# Patient Record
Sex: Female | Born: 1940
Health system: Southern US, Community
[De-identification: ages and names within clinical notes are randomized; demographics above are authoritative.]

## PROBLEM LIST (undated history)

## (undated) DIAGNOSIS — I341 Nonrheumatic mitral (valve) prolapse: Secondary | ICD-10-CM

## (undated) DIAGNOSIS — R51 Headache: Secondary | ICD-10-CM

## (undated) DIAGNOSIS — R112 Nausea with vomiting, unspecified: Secondary | ICD-10-CM

## (undated) DIAGNOSIS — J45909 Unspecified asthma, uncomplicated: Secondary | ICD-10-CM

## (undated) DIAGNOSIS — I82409 Acute embolism and thrombosis of unspecified deep veins of unspecified lower extremity: Secondary | ICD-10-CM

## (undated) DIAGNOSIS — K219 Gastro-esophageal reflux disease without esophagitis: Secondary | ICD-10-CM

## (undated) DIAGNOSIS — E785 Hyperlipidemia, unspecified: Secondary | ICD-10-CM

## (undated) DIAGNOSIS — C801 Malignant (primary) neoplasm, unspecified: Secondary | ICD-10-CM

## (undated) DIAGNOSIS — T4145XA Adverse effect of unspecified anesthetic, initial encounter: Secondary | ICD-10-CM

## (undated) DIAGNOSIS — Z302 Encounter for sterilization: Secondary | ICD-10-CM

## (undated) DIAGNOSIS — I10 Essential (primary) hypertension: Secondary | ICD-10-CM

## (undated) DIAGNOSIS — R011 Cardiac murmur, unspecified: Secondary | ICD-10-CM

## (undated) DIAGNOSIS — U071 COVID-19: Secondary | ICD-10-CM

## (undated) DIAGNOSIS — I499 Cardiac arrhythmia, unspecified: Secondary | ICD-10-CM

## (undated) DIAGNOSIS — Z9889 Other specified postprocedural states: Secondary | ICD-10-CM

## (undated) DIAGNOSIS — N390 Urinary tract infection, site not specified: Secondary | ICD-10-CM

## (undated) DIAGNOSIS — R519 Headache, unspecified: Secondary | ICD-10-CM

## (undated) HISTORY — PX: NASAL SINUS SURGERY: SHX719

## (undated) HISTORY — PX: ABDOMINAL HYSTERECTOMY: SHX81

## (undated) HISTORY — DX: Unspecified asthma, uncomplicated: J45.909

## (undated) HISTORY — PX: JOINT REPLACEMENT: SHX530

## (undated) HISTORY — PX: BACK SURGERY: SHX140

## (undated) HISTORY — PX: CATARACT EXTRACTION W/ INTRAOCULAR LENS IMPLANT: SHX1309

---

## 2005-02-17 ENCOUNTER — Ambulatory Visit: Payer: Self-pay | Admitting: Psychiatry

## 2005-03-09 ENCOUNTER — Ambulatory Visit: Payer: Self-pay | Admitting: Internal Medicine

## 2005-06-09 ENCOUNTER — Ambulatory Visit: Payer: Self-pay | Admitting: Urology

## 2005-11-10 ENCOUNTER — Ambulatory Visit: Payer: Self-pay | Admitting: Internal Medicine

## 2006-01-31 ENCOUNTER — Ambulatory Visit: Payer: Self-pay | Admitting: Gastroenterology

## 2006-02-01 ENCOUNTER — Ambulatory Visit: Payer: Self-pay | Admitting: Gastroenterology

## 2007-10-17 ENCOUNTER — Ambulatory Visit: Payer: Self-pay | Admitting: Unknown Physician Specialty

## 2007-12-04 ENCOUNTER — Encounter: Payer: Self-pay | Admitting: Unknown Physician Specialty

## 2007-12-11 ENCOUNTER — Encounter: Payer: Self-pay | Admitting: Unknown Physician Specialty

## 2009-02-10 ENCOUNTER — Encounter: Payer: Self-pay | Admitting: Otolaryngology

## 2009-10-02 ENCOUNTER — Inpatient Hospital Stay: Payer: Self-pay | Admitting: Internal Medicine

## 2009-10-23 ENCOUNTER — Other Ambulatory Visit: Payer: Self-pay | Admitting: Gastroenterology

## 2010-01-25 ENCOUNTER — Ambulatory Visit: Payer: Self-pay | Admitting: General Practice

## 2010-02-08 ENCOUNTER — Inpatient Hospital Stay: Payer: Self-pay | Admitting: General Practice

## 2010-07-26 ENCOUNTER — Ambulatory Visit: Payer: Self-pay | Admitting: Internal Medicine

## 2011-02-16 ENCOUNTER — Ambulatory Visit: Payer: Self-pay | Admitting: Gastroenterology

## 2011-02-20 LAB — PATHOLOGY REPORT

## 2011-07-12 ENCOUNTER — Ambulatory Visit: Payer: Self-pay | Admitting: Internal Medicine

## 2011-08-15 ENCOUNTER — Ambulatory Visit: Payer: Self-pay | Admitting: Internal Medicine

## 2012-02-06 ENCOUNTER — Ambulatory Visit: Payer: Self-pay | Admitting: Family Medicine

## 2012-05-15 DIAGNOSIS — R339 Retention of urine, unspecified: Secondary | ICD-10-CM | POA: Insufficient documentation

## 2012-05-15 DIAGNOSIS — R399 Unspecified symptoms and signs involving the genitourinary system: Secondary | ICD-10-CM | POA: Insufficient documentation

## 2012-05-15 DIAGNOSIS — N3946 Mixed incontinence: Secondary | ICD-10-CM | POA: Insufficient documentation

## 2012-05-15 DIAGNOSIS — R3129 Other microscopic hematuria: Secondary | ICD-10-CM | POA: Insufficient documentation

## 2012-05-15 DIAGNOSIS — N3020 Other chronic cystitis without hematuria: Secondary | ICD-10-CM | POA: Insufficient documentation

## 2012-08-15 ENCOUNTER — Ambulatory Visit: Payer: Self-pay | Admitting: Internal Medicine

## 2012-12-17 DIAGNOSIS — R1031 Right lower quadrant pain: Secondary | ICD-10-CM | POA: Insufficient documentation

## 2012-12-17 DIAGNOSIS — N83201 Unspecified ovarian cyst, right side: Secondary | ICD-10-CM | POA: Insufficient documentation

## 2013-04-09 ENCOUNTER — Ambulatory Visit: Payer: Self-pay | Admitting: Internal Medicine

## 2013-08-15 ENCOUNTER — Ambulatory Visit: Payer: Self-pay | Admitting: Ophthalmology

## 2013-08-26 ENCOUNTER — Ambulatory Visit: Payer: Self-pay | Admitting: Ophthalmology

## 2013-08-30 DIAGNOSIS — I10 Essential (primary) hypertension: Secondary | ICD-10-CM | POA: Insufficient documentation

## 2013-12-10 ENCOUNTER — Ambulatory Visit: Payer: Self-pay | Admitting: Internal Medicine

## 2014-01-08 ENCOUNTER — Ambulatory Visit: Payer: Self-pay | Admitting: Internal Medicine

## 2014-02-20 ENCOUNTER — Ambulatory Visit: Payer: Self-pay | Admitting: Physician Assistant

## 2014-03-27 DIAGNOSIS — I471 Supraventricular tachycardia: Secondary | ICD-10-CM | POA: Insufficient documentation

## 2014-08-02 NOTE — Op Note (Signed)
PATIENT NAME:  [name], Hannah DROLLINGER MR#:  [id number] DATE OF BIRTH:  24-May-1940  DATE OF PROCEDURE:  08/26/2013  PREOPERATIVE DIAGNOSIS: Cataract, right eye.   POSTOPERATIVE DIAGNOSIS: Cataract, right eye.  PROCEDURE PERFORMED: Extracapsular cataract extraction using phacoemulsification with placement of Alcon [id number], 21.5-diopter posterior chamber lens, serial number [id number].   SURGEON: Loura Back. Meagon Duskin, M.D.   ANESTHESIA: 4% lidocaine and 0.75% Marcaine a 50-50 mixture with 10 units/mL of HyoMax added, given as a peribulbar.   ANESTHESIOLOGIST: Dr. Benjamine Mola.   COMPLICATIONS: None.   ESTIMATED BLOOD LOSS: Less than 1 mL.   DESCRIPTION OF PROCEDURE: The patient was brought to the operating room and given a peribulbar block.  The patient was then prepped and draped in the usual fashion.  The vertical rectus muscles were imbricated using 5-0 silk sutures.  These sutures were then clamped to the sterile drapes as bridle sutures.  A limbal peritomy was performed extending two clock hours and hemostasis was obtained with cautery.  A partial thickness scleral groove was made at the surgical limbus and dissected anteriorly in a lamellar dissection using an Alcon crescent knife.  The anterior chamber was entered superonasally with a Superblade and through the lamellar dissection with a 2.6 mm keratome.  DisCoVisc was used to replace the aqueous and a continuous tear capsulorrhexis was carried out.  Hydrodissection and hydrodelineation were carried out with balanced salt and a 27 gauge canula.  The nucleus was rotated to confirm the effectiveness of the hydrodissection.  Phacoemulsification was carried out using a divide-and-conquer technique.  Total ultrasound time was 1 minute and 42 seconds with an average power of  24.7%. CDE 44.22.  Irrigation/aspiration was used to remove the residual cortex.  DisCoVisc was used to inflate the capsule and the internal incision was enlarged to 3 mm with the  crescent knife.  The intraocular lens was folded and inserted into the capsular bag using the AcrySert delivery system.  Irrigation/aspiration was used to remove the residual DisCoVisc.  Miostat was injected into the anterior chamber through the paracentesis track to inflate the anterior chamber and induce miosis.  The wound was checked for leaks and none were found. The conjunctiva was closed with cautery and the bridle sutures were removed.  Two drops of 0.3% Vigamox were placed on the eye.   An eye shield was placed on the eye.  The patient was discharged to the recovery room in good condition.  ____________________________ Loura Back Itzamara Casas, MD sad:aw D: 08/26/2013 13:40:09 ET T: 08/26/2013 14:15:36 ET JOB#: [id number]  cc: Remo Lipps A. Keilany Burnette, MD, <Dictator> Martie Lee MD ELECTRONICALLY SIGNED 09/16/2013 13:50

## 2014-08-05 DIAGNOSIS — K921 Melena: Secondary | ICD-10-CM | POA: Insufficient documentation

## 2014-08-05 DIAGNOSIS — R1032 Left lower quadrant pain: Secondary | ICD-10-CM | POA: Insufficient documentation

## 2014-09-07 ENCOUNTER — Emergency Department: Payer: Medicare Other

## 2014-09-07 ENCOUNTER — Inpatient Hospital Stay
Admission: EM | Admit: 2014-09-07 | Discharge: 2014-09-19 | DRG: 329 | Disposition: A | Discharge status: Home / Self Care | Payer: Medicare Other | Attending: Surgery | Admitting: Surgery

## 2014-09-07 ENCOUNTER — Encounter: Payer: Self-pay | Admitting: Emergency Medicine

## 2014-09-07 DIAGNOSIS — S36438A Laceration of other part of small intestine, initial encounter: Principal | ICD-10-CM | POA: Diagnosis present

## 2014-09-07 DIAGNOSIS — N390 Urinary tract infection, site not specified: Secondary | ICD-10-CM | POA: Diagnosis present

## 2014-09-07 DIAGNOSIS — E876 Hypokalemia: Secondary | ICD-10-CM | POA: Diagnosis not present

## 2014-09-07 DIAGNOSIS — R410 Disorientation, unspecified: Secondary | ICD-10-CM | POA: Diagnosis not present

## 2014-09-07 DIAGNOSIS — N3289 Other specified disorders of bladder: Secondary | ICD-10-CM | POA: Diagnosis present

## 2014-09-07 DIAGNOSIS — R34 Anuria and oliguria: Secondary | ICD-10-CM | POA: Diagnosis present

## 2014-09-07 DIAGNOSIS — R000 Tachycardia, unspecified: Secondary | ICD-10-CM | POA: Diagnosis not present

## 2014-09-07 DIAGNOSIS — Z79899 Other long term (current) drug therapy: Secondary | ICD-10-CM | POA: Diagnosis not present

## 2014-09-07 DIAGNOSIS — R109 Unspecified abdominal pain: Secondary | ICD-10-CM

## 2014-09-07 DIAGNOSIS — R339 Retention of urine, unspecified: Secondary | ICD-10-CM | POA: Diagnosis present

## 2014-09-07 DIAGNOSIS — N1330 Unspecified hydronephrosis: Secondary | ICD-10-CM | POA: Diagnosis not present

## 2014-09-07 DIAGNOSIS — I341 Nonrheumatic mitral (valve) prolapse: Secondary | ICD-10-CM | POA: Diagnosis present

## 2014-09-07 DIAGNOSIS — K651 Peritoneal abscess: Secondary | ICD-10-CM | POA: Diagnosis present

## 2014-09-07 DIAGNOSIS — I10 Essential (primary) hypertension: Secondary | ICD-10-CM | POA: Diagnosis present

## 2014-09-07 DIAGNOSIS — W010XXA Fall on same level from slipping, tripping and stumbling without subsequent striking against object, initial encounter: Secondary | ICD-10-CM | POA: Diagnosis present

## 2014-09-07 DIAGNOSIS — Y92007 Garden or yard of unspecified non-institutional (private) residence as the place of occurrence of the external cause: Secondary | ICD-10-CM | POA: Diagnosis not present

## 2014-09-07 DIAGNOSIS — N814 Uterovaginal prolapse, unspecified: Secondary | ICD-10-CM | POA: Diagnosis present

## 2014-09-07 DIAGNOSIS — T404X5A Adverse effect of other synthetic narcotics, initial encounter: Secondary | ICD-10-CM | POA: Diagnosis not present

## 2014-09-07 DIAGNOSIS — Z452 Encounter for adjustment and management of vascular access device: Secondary | ICD-10-CM

## 2014-09-07 DIAGNOSIS — E860 Dehydration: Secondary | ICD-10-CM | POA: Diagnosis present

## 2014-09-07 DIAGNOSIS — E785 Hyperlipidemia, unspecified: Secondary | ICD-10-CM | POA: Diagnosis present

## 2014-09-07 DIAGNOSIS — Z96659 Presence of unspecified artificial knee joint: Secondary | ICD-10-CM | POA: Diagnosis present

## 2014-09-07 DIAGNOSIS — I959 Hypotension, unspecified: Secondary | ICD-10-CM | POA: Diagnosis not present

## 2014-09-07 HISTORY — DX: Essential (primary) hypertension: I10

## 2014-09-07 HISTORY — DX: Nonrheumatic mitral (valve) prolapse: I34.1

## 2014-09-07 HISTORY — DX: Hyperlipidemia, unspecified: E78.5

## 2014-09-07 LAB — CBC
HCT: 43.5 % (ref 35.0–47.0)
Hemoglobin: 14.3 g/dL (ref 12.0–16.0)
MCH: 29.1 pg (ref 26.0–34.0)
MCHC: 33 g/dL (ref 32.0–36.0)
MCV: 88.3 fL (ref 80.0–100.0)
Platelets: 247 10*3/uL (ref 150–440)
RBC: 4.93 MIL/uL (ref 3.80–5.20)
RDW: 14.6 % — AB (ref 11.5–14.5)
WBC: 17.4 10*3/uL — AB (ref 3.6–11.0)

## 2014-09-07 LAB — URINALYSIS COMPLETE WITH MICROSCOPIC (ARMC ONLY)
BACTERIA UA: NONE SEEN
Bilirubin Urine: NEGATIVE
Glucose, UA: NEGATIVE mg/dL
HGB URINE DIPSTICK: NEGATIVE
LEUKOCYTES UA: NEGATIVE
Nitrite: NEGATIVE
PROTEIN: NEGATIVE mg/dL
Specific Gravity, Urine: 1.018 (ref 1.005–1.030)
Squamous Epithelial / LPF: NONE SEEN
pH: 5 (ref 5.0–8.0)

## 2014-09-07 LAB — BASIC METABOLIC PANEL
Anion gap: 9 (ref 5–15)
BUN: 22 mg/dL — ABNORMAL HIGH (ref 6–20)
CO2: 25 mmol/L (ref 22–32)
Calcium: 8.9 mg/dL (ref 8.9–10.3)
Chloride: 107 mmol/L (ref 101–111)
Creatinine, Ser: 0.74 mg/dL (ref 0.44–1.00)
GFR calc non Af Amer: >60 mL/min (ref >60)
GLUCOSE: 133 mg/dL — AB (ref 65–99)
Potassium: 3.9 mmol/L (ref 3.5–5.1)
Sodium: 141 mmol/L (ref 135–145)

## 2014-09-07 MED ORDER — MORPHINE SULFATE 4 MG/ML IJ SOLN
4.0000 mg | Freq: Once | INTRAMUSCULAR | Status: AC
  Administered 2014-09-07: 1 mg via INTRAVENOUS

## 2014-09-07 MED ORDER — MORPHINE SULFATE 4 MG/ML IJ SOLN
INTRAMUSCULAR | Status: AC
  Filled 2014-09-07: qty 1

## 2014-09-07 MED ORDER — ONDANSETRON HCL 4 MG/2ML IJ SOLN
4.0000 mg | Freq: Once | INTRAMUSCULAR | Status: AC
  Administered 2014-09-07: 4 mg via INTRAVENOUS

## 2014-09-07 MED ORDER — FENTANYL CITRATE (PF) 100 MCG/2ML IJ SOLN
50.0000 ug | Freq: Once | INTRAMUSCULAR | Status: AC
Start: 2014-09-07 — End: 2014-09-07
  Administered 2014-09-07: 50 ug via INTRAVENOUS

## 2014-09-07 MED ORDER — FENTANYL CITRATE (PF) 100 MCG/2ML IJ SOLN
INTRAMUSCULAR | Status: AC
  Filled 2014-09-07: qty 2

## 2014-09-07 MED ORDER — KCL IN DEXTROSE-NACL 20-5-0.45 MEQ/L-%-% IV SOLN
INTRAVENOUS | Status: DC
  Administered 2014-09-07 – 2014-09-09 (×5): via INTRAVENOUS
  Filled 2014-09-07 (×7): qty 1000

## 2014-09-07 MED ORDER — FAMOTIDINE IN NACL 20-0.9 MG/50ML-% IV SOLN
20.0000 mg | Freq: Two times a day (BID) | INTRAVENOUS | Status: DC
  Administered 2014-09-07 – 2014-09-16 (×19): 20 mg via INTRAVENOUS
  Filled 2014-09-07 (×24): qty 50

## 2014-09-07 MED ORDER — KCL IN DEXTROSE-NACL 20-5-0.45 MEQ/L-%-% IV SOLN
INTRAVENOUS | Status: AC
  Filled 2014-09-07: qty 1000

## 2014-09-07 MED ORDER — FENTANYL CITRATE (PF) 100 MCG/2ML IJ SOLN
INTRAMUSCULAR | Status: AC
  Administered 2014-09-07: 50 ug
  Filled 2014-09-07: qty 2

## 2014-09-07 MED ORDER — PROMETHAZINE HCL 25 MG/ML IJ SOLN
25.0000 mg | Freq: Four times a day (QID) | INTRAMUSCULAR | Status: DC | PRN
  Administered 2014-09-08 – 2014-09-09 (×2): 25 mg via INTRAVENOUS
  Filled 2014-09-07 (×2): qty 1

## 2014-09-07 MED ORDER — ONDANSETRON HCL 4 MG/2ML IJ SOLN
INTRAMUSCULAR | Status: AC
  Filled 2014-09-07: qty 2

## 2014-09-07 MED ORDER — MORPHINE SULFATE 2 MG/ML IJ SOLN
2.0000 mg | INTRAMUSCULAR | Status: DC | PRN
  Administered 2014-09-07 – 2014-09-08 (×2): 2 mg via INTRAVENOUS
  Filled 2014-09-07 (×2): qty 1

## 2014-09-07 MED ORDER — FENTANYL CITRATE (PF) 100 MCG/2ML IJ SOLN
50.0000 ug | Freq: Once | INTRAMUSCULAR | Status: AC
  Administered 2014-09-07: 50 ug via INTRAVENOUS

## 2014-09-07 MED ORDER — ACETAMINOPHEN 325 MG PO TABS
650.0000 mg | ORAL_TABLET | Freq: Four times a day (QID) | ORAL | Status: DC | PRN
  Administered 2014-09-08 (×3): 650 mg via ORAL
  Administered 2014-09-09 (×2): 325 mg via ORAL
  Administered 2014-09-10 – 2014-09-12 (×4): 650 mg via ORAL
  Filled 2014-09-07 (×2): qty 2
  Filled 2014-09-07: qty 1
  Filled 2014-09-07 (×3): qty 2
  Filled 2014-09-07: qty 1
  Filled 2014-09-07 (×3): qty 2

## 2014-09-07 MED ORDER — PROMETHAZINE HCL 25 MG/ML IJ SOLN
INTRAMUSCULAR | Status: AC
  Filled 2014-09-07: qty 1

## 2014-09-07 MED ORDER — IOHEXOL 300 MG/ML  SOLN
80.0000 mL | Freq: Once | INTRAMUSCULAR | Status: AC | PRN
  Administered 2014-09-07: 80 mL via INTRAVENOUS

## 2014-09-07 MED ORDER — PROMETHAZINE HCL 25 MG/ML IJ SOLN
12.5000 mg | Freq: Once | INTRAMUSCULAR | Status: AC
Start: 2014-09-07 — End: 2014-09-07
  Administered 2014-09-07: 12.5 mg via INTRAVENOUS

## 2014-09-07 MED ORDER — DIPHENHYDRAMINE HCL 50 MG/ML IJ SOLN: 12.5000 mg | Freq: Four times a day (QID) | INTRAMUSCULAR | Status: DC | PRN

## 2014-09-07 MED ORDER — FENTANYL CITRATE (PF) 100 MCG/2ML IJ SOLN
INTRAMUSCULAR | Status: AC
  Administered 2014-09-07: 50 ug via INTRAVENOUS
  Filled 2014-09-07: qty 2

## 2014-09-07 MED ORDER — PROMETHAZINE HCL 25 MG PO TABS
25.0000 mg | ORAL_TABLET | Freq: Four times a day (QID) | ORAL | Status: DC | PRN
  Administered 2014-09-08: 25 mg via ORAL
  Filled 2014-09-07: qty 1

## 2014-09-07 MED ORDER — DIPHENHYDRAMINE HCL 12.5 MG/5ML PO ELIX: 12.5000 mg | ORAL_SOLUTION | Freq: Four times a day (QID) | ORAL | Status: DC | PRN

## 2014-09-07 MED ORDER — ACETAMINOPHEN 650 MG RE SUPP: 650.0000 mg | Freq: Four times a day (QID) | RECTAL | Status: DC | PRN

## 2014-09-07 NOTE — H&P (Signed)
Patient ID: Hannah [name], female   DOB: 05/01/1940, 74 y.o.   MRN: [id number]  History of Present Illness Hannah [name] is a 74 y.o. female who stepped out this morning to feed her fish and slipped on a wet rock, landing on her right hip or buttock. She had no loss of consciousness or amnesia of the event. Since that time, the patient has been unable to walk and unable to urinate. When she tried to urinate she had intense pelvic pain. She has no specific orthopedic complaints though. Later that day she developed nausea and vomiting. She is previously undergone a bladder tacking procedure and was previously seen by Dr. Edrick Oh, but he no longer sees patients at this hospital.  Past Medical History Past Medical History  Diagnosis Date  . Hypertension   . Hyperlipidemia   . Mitral valve prolapse       Past Surgical History  Procedure Laterality Date  . Back surgery    . Abdominal hysterectomy      Allergies  Allergen Reactions  . Codeine Hives  . Dilaudid [Hydromorphone Hcl] Hives  . Vicodin [Hydrocodone-Acetaminophen] Hives    No current facility-administered medications for this encounter.   No current outpatient prescriptions on file.    Family History No family history on file.   Father was involved in an accident and was paraplegic for over a decade prior to his death. Her mother suffered a stroke and lived for many years prior to her death.  Social History History  Substance Use Topics  . Smoking status: Never Smoker   . Smokeless tobacco: Never Used  . Alcohol Use: No      Review of Systems A 10 point review of systems was asked and was negative except for the following positive findings: Severe lower abdominal pain, particularly exacerbated with attempting urination.  Physical Exam Blood pressure 144/59, pulse 93, temperature 97.6 F (36.4 C), temperature source Oral, resp. rate 20, height 5\' 6"  (1.676 m), weight 61.236 kg (135 lb), SpO2 98  %.  CONSTITUTIONAL: Elderly patient, attended by many concerned family members. EYES: Pupils equal, round, and reactive to light, Sclera non-icteric. EARS, NOSE, MOUTH AND THROAT: The oropharynx is clear. Oral mucosa is pink and moist. Hearing is intact to voice.  NECK: Trachea is midline, and there is no jugular venous distension. Thyroid is without palpable abnormalities. LYMPH NODES:  Lymph nodes in the neck are not enlarged. RESPIRATORY:  Lungs are clear, and breath sounds are equal bilaterally. Normal respiratory effort without pathologic use of accessory muscles. CARDIOVASCULAR: Heart is regular without murmurs, gallops, or rubs. GI: The abdomen is soft and nondistended. There were no palpable masses. There was no hepatosplenomegaly. There were normal bowel sounds. There is some suprapubic tenderness that extends to the right lower quadrant and left lower quadrant and it is difficult to tell whether she has rebound tenderness or not but she does not have involuntary guarding. GU: Not performed. MUSCULOSKELETAL:  Normal muscle strength and tone in all four extremities.    SKIN: Skin turgor is normal. There are no pathologic skin lesions.  NEUROLOGIC:  Motor and sensation is grossly normal.  Cranial nerves are grossly intact. PSYCH:  Alert and oriented to person, place and time. Affect is normal.  Data Reviewed CT scan, cystogram, CT cystogram, labs. I have personally reviewed the patient's imaging and medical records.    Assessment    Fall with possible bladder rupture,, although I have spoken to the urologist who has  reviewed the films and he does not believe that this is possible. I have never seen a intestinal injury from a fall on flat ground, but I do not have an explanation for the moderate amount of free pelvic fluid or her urinary symptoms, or her pain or leukocytosis or vomiting.  Plan    Thus, after she is evaluated by the urologist, I will admit her for  observation.  Face-to-face time spent with the patient and care providers was 45 minutes, with more than 50% of the time spent counseling, educating, and coordinating care of the patient.     Consuela Mimes 09/07/2014, 8:51 PM

## 2014-09-07 NOTE — ED Notes (Addendum)
Patient to ER from home via EMS for c/o fall and resulting right hip/groin pain.l Patient states she was outside near goldfish pond and foot slipped. Patient was able to avoid falling in pond, but fell on right hip. Denies LOC or head trauma.

## 2014-09-07 NOTE — ED Provider Notes (Signed)
[facility] Emergency Department Provider Note    ____________________________________________  Time seen: 1345  I have reviewed the triage vital signs and the nursing notes.   HISTORY  Chief Complaint Fall and Hip Pain   History limited by: Not Limited   HPI Hannah [name] is a 74 y.o. female presented to the emergency department with lower abdominal pain. This pain started after the patient had a mechanical fall. She went out in her yard to feed her physician upon when she slipped. He fell straight on for about. Then developed lower abdominal pain. It is bilateral. It is sharp and severe. She denies hitting her head or any loss of consciousness. Denies any neck pain. Denies any pain in her wrists or hands.     Past Medical History  Diagnosis Date  . Hypertension   . Hyperlipidemia   . Mitral valve prolapse     Patient Active Problem List   Diagnosis Date Noted  . Fall due to wet surface 09/07/2014    Past Surgical History  Procedure Laterality Date  . Back surgery    . Abdominal hysterectomy      No current outpatient prescriptions on file.  Allergies Codeine; Dilaudid; and Vicodin  No family history on file.  Social History History  Substance Use Topics  . Smoking status: Never Smoker   . Smokeless tobacco: Never Used  . Alcohol Use: No    Review of Systems  Constitutional: Negative for fever. Cardiovascular: Negative for chest pain. Respiratory: Negative for shortness of breath. Gastrointestinal: positive for lower abdominal pain Genitourinary: Negative for dysuria. Musculoskeletal: Negative for back pain. Skin: Negative for rash. Neurological: Negative for headaches, focal weakness or numbness.   10-point ROS otherwise negative.  ____________________________________________   PHYSICAL EXAM:  VITAL SIGNS: ED Triage Vitals  Enc Vitals Group     BP 09/07/14 1217 167/74 mmHg     Pulse Rate 09/07/14 1217  76     Resp 09/07/14 1217 18     Temp 09/07/14 1217 97.6 F (36.4 C)     Temp Source 09/07/14 1217 Oral     SpO2 09/07/14 1217 100 %     Weight 09/07/14 1217 135 lb (61.236 kg)     Height 09/07/14 1217 5\' 6"  (1.676 m)     Head Cir --      Peak Flow --      Pain Score 09/07/14 1218 4   Constitutional: Alert and oriented. Appears to be in pain Eyes: Conjunctivae are normal. PERRL. Normal extraocular movements. ENT   Head: Normocephalic and atraumatic.   Nose: No congestion/rhinnorhea.   Mouth/Throat: Mucous membranes are moist.   Neck: No stridor.no midline tenderness. Hematological/Lymphatic/Immunilogical: No cervical lymphadenopathy. Cardiovascular: Normal rate, regular rhythm.  No murmurs, rubs, or gallops. Respiratory: Normal respiratory effort without tachypnea nor retractions. Breath sounds are clear and equal bilaterally. No wheezes/rales/rhonchi. Gastrointestinal: tender to palpation bilateral lower abdomen. Pelvis is stable. Genitourinary: Deferred Musculoskeletal: Normal range of motion in all extremities. No pain with range of motion of upper extremities. No pain with internal/external rotation of hips.No joint effusions.  No lower extremity tenderness nor edema. Neurologic:  Normal speech and language. No gross focal neurologic deficits are appreciated. Speech is normal.  Skin:  Skin is warm, dry and intact. No rash noted. Psychiatric: Mood and affect are normal. Speech and behavior are normal. Patient exhibits appropriate insight and judgment.  ____________________________________________    LABS (pertinent positives/negatives)  Labs pending  ____________________________________________   EKG  None  ____________________________________________    RADIOLOGY  CT abd/pel pending  ____________________________________________   PROCEDURES  Procedure(s) performed: None  Critical Care performed:  No  ____________________________________________   INITIAL IMPRESSION / ASSESSMENT AND PLAN / ED COURSE  Pertinent labs & imaging results that were available during my care of the patient were reviewed by me and considered in my medical decision making (see chart for details).  Patient here with lower abdominal pain after mechanical fall. On exam patient with tenderness to the suprapubic and bilateral lower quadrants. Pelvis x-ray did not show any acute osseous injury. However patient still has significant abdominal pain and did have urinary retention and Foley needed to be placed. Given the continued pain and exam will get a CT abdomen and pelvis.  ____________________________________________   FINAL CLINICAL IMPRESSION(S) / ED DIAGNOSES  Final diagnoses:  Bladder rupture     Nance Pear, MD 09/08/14 1356

## 2014-09-07 NOTE — ED Notes (Signed)
Patient's O2 sat dropped to 83% on RA after pain medication administration. 2L O2 applied via nasal cannula. Awaiting CT scan.

## 2014-09-07 NOTE — Consult Note (Signed)
Reason for Consult:Rule Out Bladder Rupture After Fall, Urinary Hesitancy After Fall, Pelvic Pain After Fall, Pelvic Fluid by Imaging After Fall  Referring Physician: Molly Maduro MD  Hannah [name] is an 74 y.o. female.   HPI:   1 - Rule Out Bladder Rupture After Fall - pt with fall onto buttocks from standing after slipping on wet surface while getting ready for church AM 5/29. Noted acute and then worsening pain and presented to [facility] ER. Initial ER CT with IV contrast with some pelvic free fluid worrisome for possible bladder rupture. F/u dedicated CT cystogram (> 1 hr after initial study with clamped foley) without any evidence of contrast extravasation from well distended bladder. Ureters well opacified by serial studies. UA w/o RBC's whatsoever.  2- Urinary Hesitancy After Fall - pt with some urinary hesitancy after fall. Admits to guarding from pain. Did not void after injury until ER foley placed. No pelvic hematomas or palpable urethral or bladder neck injuries on pelvic exam.   3 - Pelvic Pain After Fall - pt describes post fall pain as pelvic and worse with weight bearing but not changed with full v. Empty bladder (pre/post foley). Requiring narcotic pain meds in ER.  4 - Pelvic Fluid by Imaging After Fall - small pelvic fluid on all phases of CT's in ER 5/29 after fall from standing. Fluid HU 10-15 on initial series (best demonstrates fluid).   PMH sig for parital hyst / bladder suspension (still has ovaries), total knee replacement, back surgery x2.  No ischemic CV disease. No blood thinners.   Today Millie is seen in consultation for above.   Past Medical History  Diagnosis Date  . Hypertension   . Hyperlipidemia   . Mitral valve prolapse     Past Surgical History  Procedure Laterality Date  . Back surgery    . Abdominal hysterectomy      No family history on file.  Social History:  reports that she has never smoked. She has never used smokeless tobacco.  She reports that she does not drink alcohol or use illicit drugs.  Allergies:  Allergies  Allergen Reactions  . Codeine Hives  . Dilaudid [Hydromorphone Hcl] Hives  . Vicodin [Hydrocodone-Acetaminophen] Hives    Medications: I have reviewed the patient's current medications.  Results for orders placed or performed during the hospital encounter of 09/07/14 (from the past 48 hour(s))  CBC     Status: Abnormal   Collection Time: 09/07/14  3:19 PM  Result Value Ref Range   WBC 17.4 (H) 3.6 - 11.0 K/uL   RBC 4.93 3.80 - 5.20 MIL/uL   Hemoglobin 14.3 12.0 - 16.0 g/dL   HCT 43.5 35.0 - 47.0 %   MCV 88.3 80.0 - 100.0 fL   MCH 29.1 26.0 - 34.0 pg   MCHC 33.0 32.0 - 36.0 g/dL   RDW 14.6 (H) 11.5 - 14.5 %   Platelets 247 150 - 440 K/uL  Basic metabolic panel     Status: Abnormal   Collection Time: 09/07/14  3:19 PM  Result Value Ref Range   Sodium 141 135 - 145 mmol/L   Potassium 3.9 3.5 - 5.1 mmol/L   Chloride 107 101 - 111 mmol/L   CO2 25 22 - 32 mmol/L   Glucose, Bld 133 (H) 65 - 99 mg/dL   BUN 22 (H) 6 - 20 mg/dL   Creatinine, Ser 0.74 0.44 - 1.00 mg/dL   Calcium 8.9 8.9 - 10.3 mg/dL   GFR  calc non Af Amer >60 >60 mL/min   GFR calc Af Amer >60 >60 mL/min    Comment: (NOTE) The eGFR has been calculated using the CKD EPI equation. This calculation has not been validated in all clinical situations. eGFR's persistently <60 mL/min signify possible Chronic Kidney Disease.    Anion gap 9 5 - 15  Urinalysis complete, with microscopic [facility])     Status: Abnormal   Collection Time: 09/07/14  3:19 PM  Result Value Ref Range   Color, Urine YELLOW (A) YELLOW   APPearance CLEAR (A) CLEAR   Glucose, UA NEGATIVE NEGATIVE mg/dL   Bilirubin Urine NEGATIVE NEGATIVE   Ketones, ur TRACE (A) NEGATIVE mg/dL   Specific Gravity, Urine 1.018 1.005 - 1.030   Hgb urine dipstick NEGATIVE NEGATIVE   pH 5.0 5.0 - 8.0   Protein, ur NEGATIVE NEGATIVE mg/dL   Nitrite NEGATIVE NEGATIVE   Leukocytes,  UA NEGATIVE NEGATIVE   RBC / HPF 0-5 0 - 5 RBC/hpf   WBC, UA 0-5 0 - 5 WBC/hpf   Bacteria, UA NONE SEEN NONE SEEN   Squamous Epithelial / LPF NONE SEEN NONE SEEN   Mucous PRESENT     Dg Pelvis 1-2 Views  09/07/2014   CLINICAL DATA:  Fall, right hip/groin pain  EXAM: PELVIS - 1-2 VIEW  COMPARISON:  None.  FINDINGS: No fracture or dislocation is seen.  Bilateral hip joint spaces are symmetric.  Visualized bony pelvis appears intact.  Mild degenerative changes the lower lumbar spine.  IMPRESSION: No fracture or dislocation is seen.   Electronically Signed   By: Julian Hy M.D.   On: 09/07/2014 14:52   Dg Abd 1 View  09/07/2014   CLINICAL DATA:  Bladder rupture  EXAM: ABDOMEN - 1 VIEW  COMPARISON:  09/07/2014 CT scan  FINDINGS: IV contrast is seen in the renal collecting systems bilaterally as well as within the bladder, with contrast outlining a Foley catheter. Consistent with findings on CT scan, there also appears to be small volume of contrast outside of the bladder.  IMPRESSION: Findings consistent with history.   Electronically Signed   By: Skipper Cliche M.D.   On: 09/07/2014 18:32   Ct Pelvis Wo Contrast  09/07/2014   CLINICAL DATA:  Possible bladder rupture on recent CT examination  EXAM: CT PELVIS WITHOUT CONTRAST  TECHNIQUE: Multidetector CT imaging of the pelvis was performed following the standard protocol without intravenous contrast.  COMPARISON:  CT from earlier in the same day  FINDINGS: Soft tissue changes are noted in the right buttock consistent with the recent trauma. Bony structures show degenerative change of the lumbar spine. No definitive pelvic fracture is seen.  The bladder is well distended with opacified urine following clamping of the Foley catheter. Air is noted within the bladder related to the recent instrumentation. This corresponds to the air seen previously. This was likely irregular due to the decompressed nature of the bladder. Some free fluid is noted within  the pelvis although no extravasation of contrast from the bladder is seen. No bladder wall irregularity is noted.  IMPRESSION: No evidence of bladder rupture. The air and fluid seen previously is felt to have been within the bladder but irregular due to the decompressed nature of the bladder.  Minimal free pelvic fluid remains. No other focal abnormality is seen.   Electronically Signed   By: Inez Catalina M.D.   On: 09/07/2014 19:16   Ct Abdomen Pelvis W Contrast  09/07/2014   ADDENDUM  REPORT: 09/07/2014 17:23  ADDENDUM: Findings discussed with Dr. Karma Greaser by Dr. Nyoka Cowden at the time of dictation 09/07/14.   Electronically Signed   By: Conchita Paris M.D.   On: 09/07/2014 17:23   09/07/2014   CLINICAL DATA:  Fall, pelvic pain.  Prior hysterectomy.  EXAM: CT ABDOMEN AND PELVIS WITH CONTRAST  TECHNIQUE: Multidetector CT imaging of the abdomen and pelvis was performed using the standard protocol following bolus administration of intravenous contrast.  CONTRAST:  79m OMNIPAQUE IOHEXOL 300 MG/ML  SOLN  COMPARISON:  10/02/2009  FINDINGS: Lower chest: Patchy dependent bibasilar airspace opacities are noted, most likely atelectasis in the absence of any pulmonary symptoms.  Hepatobiliary: 1.1 cm medial segment left hepatic lobe cyst reidentified. Hepatic hypodensity suggests steatosis. Gallbladder unremarkable.  Pancreas: Normal  Spleen: Normal  Adrenals/Urinary Tract: Adrenal glands appear normal. Bilateral renal cortical cysts are reidentified. No hydroureteronephrosis.  Stomach/Bowel: Although there are a few mid small bowel loops in the left mid abdomen with diameter at upper limits of normal and trace surrounding fluid, no wall thickening or focal abnormality is identified. There is a small amount of fluid tracking along the pericolic gutters, mesenteric, and the pelvis. The appendix appears normal.  Vascular/Lymphatic: Mild atheromatous aortic calcification without aneurysm. No lymphadenopathy.  Reproductive:  Ovaries appear normal.  Uterus surgically absent.  Other: Moderate free intraperitoneal fluid is identified as well as foci of fluid and gas at the bladder apex with adjacent air-fluid level within the bladder and a Foley catheter in place. Soft tissue stranding is noted at the inferomedial aspect of the right buttock. Incidental note is made of prolapse of the vagina and colon below the pubococcygeal line.  Musculoskeletal: Bones are osteopenic which could mask subtle fracture, but no acute fracture is visualized.  IMPRESSION: Intraperitoneal fluid as well as gas and fluid adjacent to the bladder apex with Foley catheter in place. Findings are compatible with intraperitoneal bladder rupture.  Bones are osteopenic, but no fracture is identified.  Vaginal and rectal prolapse below the pubococcygeal line.  Soft tissue injury to the right buttock.  Electronically Signed: By: GConchita ParisM.D. On: 09/07/2014 16:50    Review of Systems  Constitutional: Negative.  Negative for fever, chills and weight loss.  HENT: Negative.   Eyes: Negative.  Negative for blurred vision.  Respiratory: Negative.  Negative for cough.   Cardiovascular: Negative.  Negative for chest pain.  Gastrointestinal: Positive for abdominal pain. Negative for nausea and vomiting.  Genitourinary: Negative.  Negative for hematuria and flank pain.       Difficulty initiating void after fall  Musculoskeletal: Positive for back pain and falls.  Skin: Negative.   Neurological: Negative.   Endo/Heme/Allergies: Negative.  Does not bruise/bleed easily.  Psychiatric/Behavioral: Negative.    Blood pressure 144/59, pulse 93, temperature 97.6 F (36.4 C), temperature source Oral, resp. rate 20, height '5\' 6"'  (1.676 m), weight 61.236 kg (135 lb), SpO2 98 %. Physical Exam  Constitutional: She is oriented to person, place, and time. She appears well-developed.  Family at bedside  HENT:  Head: Normocephalic.  Eyes: Pupils are equal, round, and  reactive to light.  Neck: Normal range of motion.  Cardiovascular: Normal rate.   Respiratory: Effort normal.  GI:  Mild diffuse TTP, worse over pubic ring, though not substantially localizing.   Genitourinary:  Cervix absent. No vaginal vault blood / hematoma / fluid. Mild rectocele / posterior prolapse. No anterior or apical defects. Foley c/d/i with clear urine.  Musculoskeletal:  LE ROM not assessed due to pain. UE ROM intact  Neurological: She is alert and oriented to person, place, and time. No cranial nerve deficit. Coordination normal.  Skin: Skin is warm and dry.  Psychiatric: She has a normal mood and affect. Her behavior is normal. Judgment and thought content normal.    Assessment/Plan:  1 - Rule Out Bladder Rupture After Fall - very low liklihood of rupture given no blood on initial UA and very favorable CT cystogram with no contrast extravastaion in setting of good bladder distension. Also very low liklihood any ureteral injury given delayed nature of films and good ureteral opacification. Mechanism of injury also would be highly unusual for rupture.   Agree with serial exams, AM labs. No indication for operative exploration from GU perspective. Should pt undergo abdominal exploration for other indication we are happy to participate.   2- Urinary Hesitancy After Fall - likely from guarding due to pain after fall, low suspicion for anatomic lower tract GU injury given favorable imaging and exam. Sacral neuropraxia also possible given mechanism (fall on sacrum, h/o back surgeries) but also felt less likely. WIll order DC foley at 4AM tomorrow for trial of void.  Should she fail trial of void would simply DC with foley in place and we will arrange for office re-trial after over acute pain.   3 - Pelvic Pain After Fall - likely due to bony bruise MSK strain / sprain given mechanism and history. Agree with serial exams as per above. Would consider repeat imaging with GI contrast if  clinical suspicion should increase for significant intraabdominal injury.   4 - Pelvic Fluid by Imaging After Fall - scant and likely physiologic and serous in nature given quantity and density on imaging. Although pt post-menaupasal she does have ovaries in situ and some element of scant free fluid can be seen.  5 - Will follow. Pt and family updated on plan.    Esmond Hinch 09/07/2014, 10:00 PM

## 2014-09-07 NOTE — ED Provider Notes (Signed)
-----------------------------------------   5:03 PM on 09/07/2014 -----------------------------------------  Assuming care from Dr. Archie Balboa.  In short, Hannah [name] is a 74 y.o. female with a chief complaint of Fall and Hip Pain .  Refer to the original H&P for additional details.  CT of the abdomen and pelvis is consistent with a bladder rupture. I discussed with our on-call urologist Dr. Tammi Klippel, who recommends a stat CT cystogram to further evaluate. I did inform the patient of the findings, we'll continue to treat her pain obtain a stat CT cystogram and consult Dr. Tammi Klippel for further treatment/plan.  CT cystogram has returned showing no bladder rupture. But it does show a mild amount of free fluid within the pelvis. Given the patient's elevated white blood cell count, continued moderate lower abdominal tenderness to palpation in free fluid on a CT scan I have discussed with Dr. Tammi Klippel, and Dr. Elliot Cousin. Dr. Tammi Klippel will be in to see the patient, and Dr.Marteere will admit to his service for further monitoring and workup.   Harvest Dark, MD 09/07/14 2132

## 2014-09-07 NOTE — ED Notes (Signed)
MD at bedside. 

## 2014-09-07 NOTE — ED Notes (Addendum)
1mg  Morphine given per patient request.

## 2014-09-07 NOTE — ED Notes (Signed)
Patient c/o severe pain at lower abdomen and vagina. +Nausea and vomiting as well. Pain medication ordered at 4mg , but patient requested partial dose as she states "I don't like narcotics". 1 mg Morphine given, patient began vomiting worse. Phenergan given as verbally ordered. Patient appears more comfortable at this time.

## 2014-09-08 ENCOUNTER — Encounter: Payer: Self-pay | Admitting: Surgery

## 2014-09-08 LAB — CBC
HEMATOCRIT: 40.5 % (ref 35.0–47.0)
Hemoglobin: 13.2 g/dL (ref 12.0–16.0)
MCH: 28.9 pg (ref 26.0–34.0)
MCHC: 32.6 g/dL (ref 32.0–36.0)
MCV: 88.7 fL (ref 80.0–100.0)
PLATELETS: 234 10*3/uL (ref 150–440)
RBC: 4.57 MIL/uL (ref 3.80–5.20)
RDW: 14.5 % (ref 11.5–14.5)
WBC: 16.9 10*3/uL — AB (ref 3.6–11.0)

## 2014-09-08 LAB — COMPREHENSIVE METABOLIC PANEL
ALBUMIN: 3.6 g/dL (ref 3.5–5.0)
ALK PHOS: 49 U/L (ref 38–126)
ALT: 22 U/L (ref 14–54)
AST: 28 U/L (ref 15–41)
Anion gap: 8 (ref 5–15)
BUN: 19 mg/dL (ref 6–20)
CO2: 23 mmol/L (ref 22–32)
Calcium: 8.5 mg/dL — ABNORMAL LOW (ref 8.9–10.3)
Chloride: 108 mmol/L (ref 101–111)
Creatinine, Ser: 0.68 mg/dL (ref 0.44–1.00)
GFR calc non Af Amer: >60 mL/min (ref >60)
Glucose, Bld: 177 mg/dL — ABNORMAL HIGH (ref 65–99)
Potassium: 4.3 mmol/L (ref 3.5–5.1)
Sodium: 139 mmol/L (ref 135–145)
Total Bilirubin: 0.8 mg/dL (ref 0.3–1.2)
Total Protein: 6.6 g/dL (ref 6.5–8.1)

## 2014-09-08 LAB — APTT: APTT: 26 s (ref 24–36)

## 2014-09-08 LAB — PROTIME-INR
INR: 1.01
PROTHROMBIN TIME: 13.5 s (ref 11.4–15.0)

## 2014-09-08 LAB — AMYLASE: AMYLASE: 102 U/L — AB (ref 28–100)

## 2014-09-08 MED ORDER — OXYCODONE HCL 5 MG PO TABS: 10.0000 mg | ORAL_TABLET | ORAL | Status: DC | PRN

## 2014-09-08 MED ORDER — ONDANSETRON HCL 4 MG/2ML IJ SOLN
4.0000 mg | Freq: Once | INTRAMUSCULAR | Status: AC
  Administered 2014-09-08: 4 mg via INTRAVENOUS
  Filled 2014-09-08: qty 2

## 2014-09-08 NOTE — Progress Notes (Signed)
Subjective:  1 - Rule Out Bladder Rupture After Fall - pt with fall onto buttocks from standing after slipping on wet surface while getting ready for church AM 5/29. Noted acute and then worsening pain and presented to [facility] ER. Initial ER CT with IV contrast with some pelvic free fluid worrisome for possible bladder rupture. F/u dedicated CT cystogram (> 1 hr after initial study with clamped foley) without any evidence of contrast extravasation from well distended bladder. Ureters well opacified by serial studies. UA w/o RBC's whatsoever. Cr stable by serial labs.   2- Urinary Hesitancy After Fall - pt with some urinary hesitancy after fall. Admits to guarding from pain. Did not void after injury until ER foley placed. No pelvic hematomas or palpable urethral or bladder neck injuries on pelvic exam. Foley DC'd AM 5/30 for re-trial of void.   3 - Pelvic Pain After Fall - pt describes post fall pain as pelvic and worse with weight bearing but not changed with full v. Empty bladder (pre/post foley). Requiring narcotic pain meds in ER. Has Rt buttocks / sacral bruising.   4 - Pelvic Fluid by Imaging After Fall - small pelvic fluid on all phases of CT's in ER 5/29 after fall from standing. Fluid HU 10-15 on initial series (best demonstrates fluid).   PMH sig for parital hyst / bladder suspension (still has ovaries), total knee replacement, back surgery x2. No ischemic CV disease. No blood thinners.   Today Hannah [name] is stable. Still with sig sacral and low pelvic pain. Hgb acceptable. Several episodes emesis last PM after pain meds. Foley removed this AM as ordered.   Objective: Vital signs in last 24 hours: Temp:  [97.9 F (36.6 C)-99.7 F (37.6 C)] 99.7 F (37.6 C) (05/30 1214) Pulse Rate:  [76-102] 98 (05/30 1214) Resp:  [15-27] 16 (05/30 1214) BP: (120-148)/(45-63) 130/52 mmHg (05/30 1214) SpO2:  [85 %-98 %] 98 % (05/30 1214) Last BM Date: 09/07/14  Intake/Output from previous  day: 05/29 0701 - 05/30 0700 In: 550 [P.O.:50; I.V.:500] Out: 550 [Urine:550] Intake/Output this shift:    General appearance: alert, cooperative, appears stated age and family at bedside Head: Normocephalic, without obvious abnormality, atraumatic Nose: Nares normal. Septum midline. Mucosa normal. No drainage or sinus tenderness. Throat: lips, mucosa, and tongue normal; teeth and gums normal Neck: supple, symmetrical, trachea midline Back: symmetric, no curvature. ROM normal. No CVA tenderness., old midline L spine scar noted.  Resp: Non-labored on [place] O2 Cardio: Nl rate GI: very mild tenderness that localized most over pelvic ring Pelvic: external genitalia normal, vagina normal without discharge and no perineal / paravaganinal / vaginal ecchymosees or hematomas or drainage.  Extremities: extremities normal, atraumatic, no cyanosis or edema Skin: Skin color, texture, turgor normal. No rashes or lesions Neurologic: Grossly normal Incision/Wound: Rt buttocks purple, flat bruising towards rectum. No mass effect.   Lab Results:   Recent Labs  09/07/14 1519 09/08/14 0042  WBC 17.4* 16.9*  HGB 14.3 13.2  HCT 43.5 40.5  PLT 247 234   BMET  Recent Labs  09/07/14 1519 09/08/14 0042  NA 141 139  K 3.9 4.3  CL 107 108  CO2 25 23  GLUCOSE 133* 177*  BUN 22* 19  CREATININE 0.74 0.68  CALCIUM 8.9 8.5*   PT/INR  Recent Labs  09/08/14 0042  LABPROT 13.5  INR 1.01   ABG No results for input(s): PHART, HCO3 in the last 72 hours.  Invalid input(s): PCO2, PO2  Studies/Results: Dg Pelvis  1-2 Views  09/07/2014   CLINICAL DATA:  Fall, right hip/groin pain  EXAM: PELVIS - 1-2 VIEW  COMPARISON:  None.  FINDINGS: No fracture or dislocation is seen.  Bilateral hip joint spaces are symmetric.  Visualized bony pelvis appears intact.  Mild degenerative changes the lower lumbar spine.  IMPRESSION: No fracture or dislocation is seen.   Electronically Signed   By: Julian Hy  M.D.   On: 09/07/2014 14:52   Dg Abd 1 View  09/07/2014   CLINICAL DATA:  Bladder rupture  EXAM: ABDOMEN - 1 VIEW  COMPARISON:  09/07/2014 CT scan  FINDINGS: IV contrast is seen in the renal collecting systems bilaterally as well as within the bladder, with contrast outlining a Foley catheter. Consistent with findings on CT scan, there also appears to be small volume of contrast outside of the bladder.  IMPRESSION: Findings consistent with history.   Electronically Signed   By: Skipper Cliche M.D.   On: 09/07/2014 18:32   Ct Pelvis Wo Contrast  09/07/2014   CLINICAL DATA:  Possible bladder rupture on recent CT examination  EXAM: CT PELVIS WITHOUT CONTRAST  TECHNIQUE: Multidetector CT imaging of the pelvis was performed following the standard protocol without intravenous contrast.  COMPARISON:  CT from earlier in the same day  FINDINGS: Soft tissue changes are noted in the right buttock consistent with the recent trauma. Bony structures show degenerative change of the lumbar spine. No definitive pelvic fracture is seen.  The bladder is well distended with opacified urine following clamping of the Foley catheter. Air is noted within the bladder related to the recent instrumentation. This corresponds to the air seen previously. This was likely irregular due to the decompressed nature of the bladder. Some free fluid is noted within the pelvis although no extravasation of contrast from the bladder is seen. No bladder wall irregularity is noted.  IMPRESSION: No evidence of bladder rupture. The air and fluid seen previously is felt to have been within the bladder but irregular due to the decompressed nature of the bladder.  Minimal free pelvic fluid remains. No other focal abnormality is seen.   Electronically Signed   By: Inez Catalina M.D.   On: 09/07/2014 19:16   Ct Abdomen Pelvis W Contrast  09/07/2014   ADDENDUM REPORT: 09/07/2014 17:23  ADDENDUM: Findings discussed with Dr. Karma Greaser by Dr. Nyoka Cowden at the time  of dictation 09/07/14.   Electronically Signed   By: Conchita Paris M.D.   On: 09/07/2014 17:23   09/07/2014   CLINICAL DATA:  Fall, pelvic pain.  Prior hysterectomy.  EXAM: CT ABDOMEN AND PELVIS WITH CONTRAST  TECHNIQUE: Multidetector CT imaging of the abdomen and pelvis was performed using the standard protocol following bolus administration of intravenous contrast.  CONTRAST:  30mL OMNIPAQUE IOHEXOL 300 MG/ML  SOLN  COMPARISON:  10/02/2009  FINDINGS: Lower chest: Patchy dependent bibasilar airspace opacities are noted, most likely atelectasis in the absence of any pulmonary symptoms.  Hepatobiliary: 1.1 cm medial segment left hepatic lobe cyst reidentified. Hepatic hypodensity suggests steatosis. Gallbladder unremarkable.  Pancreas: Normal  Spleen: Normal  Adrenals/Urinary Tract: Adrenal glands appear normal. Bilateral renal cortical cysts are reidentified. No hydroureteronephrosis.  Stomach/Bowel: Although there are a few mid small bowel loops in the left mid abdomen with diameter at upper limits of normal and trace surrounding fluid, no wall thickening or focal abnormality is identified. There is a small amount of fluid tracking along the pericolic gutters, mesenteric, and the pelvis. The appendix appears  normal.  Vascular/Lymphatic: Mild atheromatous aortic calcification without aneurysm. No lymphadenopathy.  Reproductive: Ovaries appear normal.  Uterus surgically absent.  Other: Moderate free intraperitoneal fluid is identified as well as foci of fluid and gas at the bladder apex with adjacent air-fluid level within the bladder and a Foley catheter in place. Soft tissue stranding is noted at the inferomedial aspect of the right buttock. Incidental note is made of prolapse of the vagina and colon below the pubococcygeal line.  Musculoskeletal: Bones are osteopenic which could mask subtle fracture, but no acute fracture is visualized.  IMPRESSION: Intraperitoneal fluid as well as gas and fluid adjacent to the  bladder apex with Foley catheter in place. Findings are compatible with intraperitoneal bladder rupture.  Bones are osteopenic, but no fracture is identified.  Vaginal and rectal prolapse below the pubococcygeal line.  Soft tissue injury to the right buttock.  Electronically Signed: By: Conchita Paris M.D. On: 09/07/2014 16:50    Anti-infectives: Anti-infectives    None      Assessment/Plan:  1 - Rule Out Bladder Rupture After Fall - continue to feel very low liklihood of rupture given no blood on initial UA and very favorable CT cystogram   Continues to have no indication for operative exploration from GU perspective. Should pt undergo abdominal exploration for other indication we are happy to participate.   2- Urinary Hesitancy After Fall - likely from guarding due to pain after fall, low suspicion for anatomic lower tract GU injury given favorable imaging and exam. Sacral neuropraxia also possible given mechanism (fall on sacrum, h/o back surgeries) but also felt less likely.   Presently on repeat trial of void. Should pt be unable to void and accompanied by bladder discomfort / distended bladder by bedside scan, then would replace foley with plan to leave at discharge and repeat trial of void in elective setting once over most of acute trauamtic pain. Pt, family, and nursing updated on plan.   3 - Pelvic Pain After Fall - likely due to bony bruise MSK strain / sprain given mechanism and history. Agree with serial exams as per above. Would consider repeat imaging with GI contrast (including rectal) if clinical suspicion should increase for significant intraabdominal injury.   4 - Pelvic Fluid by Imaging After Fall - scant and likely physiologic and serous in nature given quantity and density on imaging.   5 - Will follow.   [facility], Taleeya Blondin 09/08/2014

## 2014-09-08 NOTE — Care Management Note (Signed)
Case Management Note  Patient Details  Name: Hannah [name] MRN: [id number] Date of Birth: 01-Feb-1941  Subjective/Objective:                  Patient uncomfortable complaining of pain and wanting to be repositioned. States she is nauseated too and relates that to pain meds. Her son [name] is at bedside. Patient is from home with her husband Paramedic and has a daughter named [name]. O2 is new for this patient. She has a cane at home but does not know where her walker is. She has used [facility] in the past. Her PCP is Dr.Mark Sabra Heck; he is aware of her admission. Son [name] concerned because "they can't figure out where the pain is coming from".   Action/Plan: RNCM will continue to follow.   Expected Discharge Date:  09/09/14               Expected Discharge Plan:     In-House Referral:  Clinical Social Work  Discharge planning Services  CM Consult  Post Acute Care Choice:  Home Health, Durable Medical Equipment Choice offered to:  Patient, Adult Children  DME Arranged:    DME Agency:  [place]:    [place]:     Status of Service:     Medicare Important Message Given:  Yes Date Medicare IM Given:  09/08/14 Medicare IM give by:  Marshell Garfinkel Date Additional Medicare IM Given:    Additional Medicare Important Message give by:     If discussed at [place] of Stay Meetings, dates discussed:    Additional Comments:  Marshell Garfinkel, RN 09/08/2014, 9:44 AM

## 2014-09-08 NOTE — Progress Notes (Signed)
Will keep watch

## 2014-09-08 NOTE — Progress Notes (Signed)
Bruising from fall to right side of rectum

## 2014-09-08 NOTE — Progress Notes (Signed)
Subjective:  She continues to have moderate suprapubic and left hip pain. She is rather sedated with Phenergan. Her pain level has decreased. She has not voided since her Foley was removed.  Vital signs in last 24 hours: Temp:  [97.6 F (36.4 C)-99.5 F (37.5 C)] 99 F (37.2 C) (05/30 0759) Pulse Rate:  [76-102] 97 (05/30 0759) Resp:  [15-27] 16 (05/30 0759) BP: (120-167)/(45-74) 148/57 mmHg (05/30 0759) SpO2:  [85 %-100 %] 97 % (05/30 0759) Weight:  [61.236 kg (135 lb)] 61.236 kg (135 lb) (05/29 1217) Last BM Date: 09/07/14  Intake/Output from previous day: 05/29 0701 - 05/30 0700 In: 550 [P.O.:50; I.V.:500] Out: 550 [Urine:550]  Exam:  She has moderate left lower quadrant suprapubic tenderness. There is an ice pack in place. She has hypoactive bowel sounds. She does have tenderness on her left hip with range of motion.  Lab Results:  CBC  Recent Labs  09/07/14 1519 09/08/14 0042  WBC 17.4* 16.9*  HGB 14.3 13.2  HCT 43.5 40.5  PLT 247 234   CMP     Component Value Date/Time   NA 139 09/08/2014 0042   K 4.3 09/08/2014 0042   CL 108 09/08/2014 0042   CO2 23 09/08/2014 0042   GLUCOSE 177* 09/08/2014 0042   BUN 19 09/08/2014 0042   CREATININE 0.68 09/08/2014 0042   CALCIUM 8.5* 09/08/2014 0042   PROT 6.6 09/08/2014 0042   ALBUMIN 3.6 09/08/2014 0042   AST 28 09/08/2014 0042   ALT 22 09/08/2014 0042   ALKPHOS 49 09/08/2014 0042   BILITOT 0.8 09/08/2014 0042   GFRNONAA >60 09/08/2014 0042   GFRAA >60 09/08/2014 0042   PT/INR  Recent Labs  09/08/14 0042  LABPROT 13.5  INR 1.01    Studies/Results: Dg Pelvis 1-2 Views  09/07/2014   CLINICAL DATA:  Fall, right hip/groin pain  EXAM: PELVIS - 1-2 VIEW  COMPARISON:  None.  FINDINGS: No fracture or dislocation is seen.  Bilateral hip joint spaces are symmetric.  Visualized bony pelvis appears intact.  Mild degenerative changes the lower lumbar spine.  IMPRESSION: No fracture or dislocation is seen.    Electronically Signed   By: Julian Hy M.D.   On: 09/07/2014 14:52   Dg Abd 1 View  09/07/2014   CLINICAL DATA:  Bladder rupture  EXAM: ABDOMEN - 1 VIEW  COMPARISON:  09/07/2014 CT scan  FINDINGS: IV contrast is seen in the renal collecting systems bilaterally as well as within the bladder, with contrast outlining a Foley catheter. Consistent with findings on CT scan, there also appears to be small volume of contrast outside of the bladder.  IMPRESSION: Findings consistent with history.   Electronically Signed   By: Skipper Cliche M.D.   On: 09/07/2014 18:32   Ct Pelvis Wo Contrast  09/07/2014   CLINICAL DATA:  Possible bladder rupture on recent CT examination  EXAM: CT PELVIS WITHOUT CONTRAST  TECHNIQUE: Multidetector CT imaging of the pelvis was performed following the standard protocol without intravenous contrast.  COMPARISON:  CT from earlier in the same day  FINDINGS: Soft tissue changes are noted in the right buttock consistent with the recent trauma. Bony structures show degenerative change of the lumbar spine. No definitive pelvic fracture is seen.  The bladder is well distended with opacified urine following clamping of the Foley catheter. Air is noted within the bladder related to the recent instrumentation. This corresponds to the air seen previously. This was likely irregular due to the decompressed  nature of the bladder. Some free fluid is noted within the pelvis although no extravasation of contrast from the bladder is seen. No bladder wall irregularity is noted.  IMPRESSION: No evidence of bladder rupture. The air and fluid seen previously is felt to have been within the bladder but irregular due to the decompressed nature of the bladder.  Minimal free pelvic fluid remains. No other focal abnormality is seen.   Electronically Signed   By: Inez Catalina M.D.   On: 09/07/2014 19:16   Ct Abdomen Pelvis W Contrast  09/07/2014   ADDENDUM REPORT: 09/07/2014 17:23  ADDENDUM: Findings  discussed with Dr. Karma Greaser by Dr. Nyoka Cowden at the time of dictation 09/07/14.   Electronically Signed   By: Conchita Paris M.D.   On: 09/07/2014 17:23   09/07/2014   CLINICAL DATA:  Fall, pelvic pain.  Prior hysterectomy.  EXAM: CT ABDOMEN AND PELVIS WITH CONTRAST  TECHNIQUE: Multidetector CT imaging of the abdomen and pelvis was performed using the standard protocol following bolus administration of intravenous contrast.  CONTRAST:  21mL OMNIPAQUE IOHEXOL 300 MG/ML  SOLN  COMPARISON:  10/02/2009  FINDINGS: Lower chest: Patchy dependent bibasilar airspace opacities are noted, most likely atelectasis in the absence of any pulmonary symptoms.  Hepatobiliary: 1.1 cm medial segment left hepatic lobe cyst reidentified. Hepatic hypodensity suggests steatosis. Gallbladder unremarkable.  Pancreas: Normal  Spleen: Normal  Adrenals/Urinary Tract: Adrenal glands appear normal. Bilateral renal cortical cysts are reidentified. No hydroureteronephrosis.  Stomach/Bowel: Although there are a few mid small bowel loops in the left mid abdomen with diameter at upper limits of normal and trace surrounding fluid, no wall thickening or focal abnormality is identified. There is a small amount of fluid tracking along the pericolic gutters, mesenteric, and the pelvis. The appendix appears normal.  Vascular/Lymphatic: Mild atheromatous aortic calcification without aneurysm. No lymphadenopathy.  Reproductive: Ovaries appear normal.  Uterus surgically absent.  Other: Moderate free intraperitoneal fluid is identified as well as foci of fluid and gas at the bladder apex with adjacent air-fluid level within the bladder and a Foley catheter in place. Soft tissue stranding is noted at the inferomedial aspect of the right buttock. Incidental note is made of prolapse of the vagina and colon below the pubococcygeal line.  Musculoskeletal: Bones are osteopenic which could mask subtle fracture, but no acute fracture is visualized.  IMPRESSION:  Intraperitoneal fluid as well as gas and fluid adjacent to the bladder apex with Foley catheter in place. Findings are compatible with intraperitoneal bladder rupture.  Bones are osteopenic, but no fracture is identified.  Vaginal and rectal prolapse below the pubococcygeal line.  Soft tissue injury to the right buttock.  Electronically Signed: By: Conchita Paris M.D. On: 09/07/2014 16:50    Assessment/Plan: I still have a significant source for her current problem. CT scan suggests she did not have a bladder injury. Her uterus is prolapsed and may be responsible for some of her symptoms. We're going to keep an eye out for a rectal or bowel injury from her fall but it seems unlikely with her current findings. Repeat her labs try to find a pain medicine combination is reasonable for her and drain her bladder again if she requires voiding. This plan was discussed with the patient and her son and they are in agreement.

## 2014-09-08 NOTE — Progress Notes (Signed)
Patient new admit with pain and decreased mobility. Pain meds iv and antiemetic  Given x 1 iv . Family at bedside . Alert and oriented. foley intact  R/t urinary retention . Fell on buttocks bruising around rectal area.will continue to encourage movement . PT to evaluate.

## 2014-09-08 NOTE — Progress Notes (Signed)
Large black blue bruising

## 2014-09-09 ENCOUNTER — Inpatient Hospital Stay: Payer: Medicare Other | Admitting: Anesthesiology

## 2014-09-09 ENCOUNTER — Encounter
Admission: EM | Disposition: A | Discharge status: Home / Self Care | Payer: Self-pay | Source: Home / Self Care | Attending: Surgery

## 2014-09-09 ENCOUNTER — Inpatient Hospital Stay: Payer: Medicare Other

## 2014-09-09 HISTORY — PX: BOWEL RESECTION: SHX1257

## 2014-09-09 HISTORY — PX: CARDIAC CATHETERIZATION: SHX172

## 2014-09-09 HISTORY — PX: LAPAROTOMY: SHX154

## 2014-09-09 LAB — BASIC METABOLIC PANEL
Anion gap: 9 (ref 5–15)
BUN: 15 mg/dL (ref 6–20)
CALCIUM: 8.4 mg/dL — AB (ref 8.9–10.3)
CO2: 24 mmol/L (ref 22–32)
CREATININE: 0.61 mg/dL (ref 0.44–1.00)
Chloride: 101 mmol/L (ref 101–111)
GFR calc Af Amer: >60 mL/min (ref >60)
Glucose, Bld: 161 mg/dL — ABNORMAL HIGH (ref 65–99)
POTASSIUM: 4.3 mmol/L (ref 3.5–5.1)
SODIUM: 134 mmol/L — AB (ref 135–145)

## 2014-09-09 LAB — CBC
HCT: 40.5 % (ref 35.0–47.0)
HEMOGLOBIN: 13.3 g/dL (ref 12.0–16.0)
MCH: 29.3 pg (ref 26.0–34.0)
MCHC: 32.8 g/dL (ref 32.0–36.0)
MCV: 89.1 fL (ref 80.0–100.0)
Platelets: 237 10*3/uL (ref 150–440)
RBC: 4.54 MIL/uL (ref 3.80–5.20)
RDW: 14.8 % — ABNORMAL HIGH (ref 11.5–14.5)
WBC: 13.4 10*3/uL — AB (ref 3.6–11.0)

## 2014-09-09 SURGERY — LAPAROTOMY, EXPLORATORY
Anesthesia: General | Laterality: Right

## 2014-09-09 MED ORDER — SUCCINYLCHOLINE CHLORIDE 20 MG/ML IJ SOLN
INTRAMUSCULAR | Status: DC | PRN
  Administered 2014-09-09: 100 mg via INTRAVENOUS

## 2014-09-09 MED ORDER — METOPROLOL TARTRATE 25 MG PO TABS
25.0000 mg | ORAL_TABLET | Freq: Once | ORAL | Status: AC
  Administered 2014-09-09: 25 mg via ORAL
  Filled 2014-09-09: qty 1

## 2014-09-09 MED ORDER — METRONIDAZOLE IN NACL 5-0.79 MG/ML-% IV SOLN
500.0000 mg | Freq: Three times a day (TID) | INTRAVENOUS | Status: DC
  Administered 2014-09-09 – 2014-09-12 (×8): 500 mg via INTRAVENOUS
  Filled 2014-09-09 (×14): qty 100

## 2014-09-09 MED ORDER — ROCURONIUM BROMIDE 100 MG/10ML IV SOLN
INTRAVENOUS | Status: DC | PRN
  Administered 2014-09-09: 30 mg via INTRAVENOUS
  Administered 2014-09-09: 10 mg via INTRAVENOUS

## 2014-09-09 MED ORDER — LACTATED RINGERS IV SOLN
INTRAVENOUS | Status: DC | PRN
  Administered 2014-09-09: 18:00:00 via INTRAVENOUS

## 2014-09-09 MED ORDER — PHENYLEPHRINE HCL 10 MG/ML IJ SOLN
INTRAMUSCULAR | Status: DC | PRN
  Administered 2014-09-09 (×3): 100 ug via INTRAVENOUS

## 2014-09-09 MED ORDER — DEXAMETHASONE SODIUM PHOSPHATE 4 MG/ML IJ SOLN
INTRAMUSCULAR | Status: DC | PRN
  Administered 2014-09-09: 5 mg via INTRAVENOUS

## 2014-09-09 MED ORDER — ONDANSETRON HCL 4 MG/2ML IJ SOLN
4.0000 mg | Freq: Four times a day (QID) | INTRAMUSCULAR | Status: DC | PRN
  Administered 2014-09-09 – 2014-09-16 (×9): 4 mg via INTRAVENOUS
  Filled 2014-09-09 (×10): qty 2

## 2014-09-09 MED ORDER — ACETAMINOPHEN 325 MG PO TABS
325.0000 mg | ORAL_TABLET | Freq: Once | ORAL | Status: AC
  Administered 2014-09-09: 325 mg via ORAL
  Filled 2014-09-09: qty 1

## 2014-09-09 MED ORDER — IOHEXOL 300 MG/ML  SOLN
100.0000 mL | Freq: Once | INTRAMUSCULAR | Status: AC | PRN
  Administered 2014-09-09: 100 mL via INTRAVENOUS

## 2014-09-09 MED ORDER — METOPROLOL TARTRATE 50 MG PO TABS
50.0000 mg | ORAL_TABLET | Freq: Once | ORAL | Status: AC
  Administered 2014-09-09: 50 mg via ORAL
  Filled 2014-09-09: qty 1

## 2014-09-09 MED ORDER — ONDANSETRON HCL 4 MG/2ML IJ SOLN: 4.0000 mg | Freq: Once | INTRAMUSCULAR | Status: AC | PRN

## 2014-09-09 MED ORDER — FENTANYL CITRATE (PF) 100 MCG/2ML IJ SOLN: 25.0000 ug | INTRAMUSCULAR | Status: DC | PRN

## 2014-09-09 MED ORDER — LIDOCAINE HCL (CARDIAC) 20 MG/ML IV SOLN
INTRAVENOUS | Status: DC | PRN
  Administered 2014-09-09: 50 mg via INTRAVENOUS

## 2014-09-09 MED ORDER — PROPOFOL 10 MG/ML IV BOLUS
INTRAVENOUS | Status: DC | PRN
  Administered 2014-09-09: 120 mg via INTRAVENOUS

## 2014-09-09 MED ORDER — ONDANSETRON HCL 4 MG/2ML IJ SOLN
INTRAMUSCULAR | Status: DC | PRN
  Administered 2014-09-09: 4 mg via INTRAVENOUS

## 2014-09-09 MED ORDER — PHENAZOPYRIDINE HCL 100 MG PO TABS
100.0000 mg | ORAL_TABLET | Freq: Three times a day (TID) | ORAL | Status: DC | PRN
  Filled 2014-09-09: qty 1

## 2014-09-09 MED ORDER — SODIUM CHLORIDE 0.9 % IV SOLN
INTRAVENOUS | Status: DC
  Administered 2014-09-09 – 2014-09-10 (×3): via INTRAVENOUS

## 2014-09-09 MED ORDER — GLYCOPYRROLATE 0.2 MG/ML IJ SOLN
INTRAMUSCULAR | Status: DC | PRN
  Administered 2014-09-09: 0.6 mg via INTRAVENOUS

## 2014-09-09 MED ORDER — NEOSTIGMINE METHYLSULFATE 10 MG/10ML IV SOLN
INTRAVENOUS | Status: DC | PRN
  Administered 2014-09-09: 3 mg via INTRAVENOUS

## 2014-09-09 MED ORDER — FENTANYL CITRATE (PF) 100 MCG/2ML IJ SOLN
INTRAMUSCULAR | Status: DC | PRN
  Administered 2014-09-09: 50 ug via INTRAVENOUS
  Administered 2014-09-09: 25 ug via INTRAVENOUS
  Administered 2014-09-09: 50 ug via INTRAVENOUS
  Administered 2014-09-09: 100 ug via INTRAVENOUS
  Administered 2014-09-09: 50 ug via INTRAVENOUS

## 2014-09-09 MED ORDER — HYDROMORPHONE HCL 1 MG/ML IJ SOLN: 1.0000 mg | INTRAMUSCULAR | Status: DC | PRN

## 2014-09-09 MED ORDER — DIPHENHYDRAMINE HCL 50 MG/ML IJ SOLN: 25.0000 mg | Freq: Four times a day (QID) | INTRAMUSCULAR | Status: DC | PRN

## 2014-09-09 MED ORDER — SODIUM CHLORIDE 0.9 % IV SOLN
Freq: Once | INTRAVENOUS | Status: AC
  Administered 2014-09-09: 10:00:00 via INTRAVENOUS

## 2014-09-09 MED ORDER — CEFTRIAXONE SODIUM IN DEXTROSE 20 MG/ML IV SOLN
1.0000 g | INTRAVENOUS | Status: DC
  Administered 2014-09-09 – 2014-09-16 (×8): 1 g via INTRAVENOUS
  Filled 2014-09-09 (×12): qty 50

## 2014-09-09 MED ORDER — METHYLENE BLUE 1 % INJ SOLN
INTRAMUSCULAR | Status: DC | PRN
  Administered 2014-09-09: 1 mL

## 2014-09-09 MED ORDER — PHENAZOPYRIDINE HCL 100 MG PO TABS
100.0000 mg | ORAL_TABLET | Freq: Three times a day (TID) | ORAL | Status: DC
  Administered 2014-09-09 – 2014-09-15 (×16): 100 mg via ORAL
  Filled 2014-09-09 (×21): qty 1

## 2014-09-09 SURGICAL SUPPLY — 42 items
BLUE BOWL ×3 IMPLANT
CANISTER SUCT 1200ML W/VALVE (MISCELLANEOUS) ×3 IMPLANT
CANISTER SUCT 3000ML PPV (MISCELLANEOUS) ×3 IMPLANT
CATH TRAY 16F METER LATEX (MISCELLANEOUS) ×2 IMPLANT
CHLORAPREP W/TINT 26ML (MISCELLANEOUS) ×6 IMPLANT
CLOSURE WOUND 1/2 X4 (GAUZE/BANDAGES/DRESSINGS)
DRAIN PENROSE 5/8X18 LTX STRL (WOUND CARE) ×4 IMPLANT
DRAPE INCISE IOBAN 66X45 STRL (DRAPES) ×2 IMPLANT
DRAPE LAPAROTOMY 100X77 ABD (DRAPES) ×5 IMPLANT
ELECT CAUTERY NEEDLE TIP 1.0 (MISCELLANEOUS) ×5
ELECTRODE CAUTERY NEDL TIP 1.0 (MISCELLANEOUS) ×3 IMPLANT
FISH ×3 IMPLANT
GAUZE SPONGE 4X4 12PLY STRL (GAUZE/BANDAGES/DRESSINGS) ×5 IMPLANT
GLOVE BIO SURGEON STRL SZ7.5 (GLOVE) ×13 IMPLANT
GLOVE INDICATOR 8.0 STRL GRN (GLOVE) ×13 IMPLANT
GOWN STRL REUS W/ TWL LRG LVL3 (GOWN DISPOSABLE) ×11 IMPLANT
GOWN STRL REUS W/TWL LRG LVL3 (GOWN DISPOSABLE) ×15
KIT RM TURNOVER STRD PROC AR (KITS) ×5 IMPLANT
KIT SUCTION CATH 14FR (SUCTIONS) ×2 IMPLANT
LABEL OR SOLS (LABEL) ×3 IMPLANT
LIGASURE 5MM LAPAROSCOPIC (INSTRUMENTS) ×3 IMPLANT
NS IRRIG 1000ML POUR BTL (IV SOLUTION) ×2 IMPLANT
NS IRRIG 500ML POUR BTL (IV SOLUTION) ×4 IMPLANT
PACK BASIN MAJOR ARMC (MISCELLANEOUS) ×5 IMPLANT
PACK COLON CLEAN CLOSURE (MISCELLANEOUS) ×3 IMPLANT
PAD GROUND ADULT SPLIT (MISCELLANEOUS) ×5 IMPLANT
RELOAD LINEAR CUT PROX 55 BLUE (ENDOMECHANICALS) ×10 IMPLANT
RELOAD STAPLE 55 3.8 BLU REG (ENDOMECHANICALS) IMPLANT
STAPLER PROXIMATE 55 BLUE (STAPLE) ×2 IMPLANT
STAPLER SKIN PROX 35W (STAPLE) ×8 IMPLANT
STRIP CLOSURE SKIN 1/2X4 (GAUZE/BANDAGES/DRESSINGS) ×4 IMPLANT
SUT MAXON ABS #0 GS21 30IN (SUTURE) ×16 IMPLANT
SUT PDS AB 1 TP1 96 (SUTURE) ×3 IMPLANT
SUT SILK 3-0 (SUTURE) ×5
SUT SILK 3-0 SH-1 18XCR BRD (SUTURE) ×3
SUT VIC AB 3-0 SH 27 (SUTURE) ×5
SUT VIC AB 3-0 SH 27X BRD (SUTURE) ×3 IMPLANT
SUT VICRYL+ 3-0 144IN (SUTURE) ×3 IMPLANT
SUTURE SILK 3-0 SH-1 18XCR BRD (SUTURE) ×3 IMPLANT
SWAB DUAL CULTURE TRANS RED ST (MISCELLANEOUS) ×2 IMPLANT
SYR 50ML LL SCALE MARK (SYRINGE) ×3 IMPLANT
TAPE TRANSPORE STRL 2 31045 (GAUZE/BANDAGES/DRESSINGS) ×3 IMPLANT

## 2014-09-09 NOTE — Transfer of Care (Signed)
Immediate Anesthesia Transfer of Care Note  Patient: Hannah [name]  Procedure(s) Performed: Procedure(s): EXPLORATORY LAPAROTOMY, small bowel resection (N/A) , bladder instilation CENTRAL LINE INSERTION (Right)  Patient Location: PACU  Anesthesia Type:General  Level of Consciousness: awake and patient cooperative  Airway & Oxygen Therapy: Patient Spontanous Breathing and Patient connected to face mask  Post-op Assessment: Report given to RN and Post -op Vital signs reviewed and stable  Post vital signs: Reviewed and stable  Last Vitals:  Filed Vitals:   09/09/14 1451  BP: 126/57  Pulse: 117  Temp: 37.3 C  Resp: 18    Complications: No apparent anesthesia complications

## 2014-09-09 NOTE — Progress Notes (Addendum)
ccu called pt had 1 run PAT WITH HEART RATE UP TO 170 FOR 5 SECONDS. RATE BACK DOWN TO 151. DR Reginia Naas NOTIFIED. MD ORDERS CARDIOLOGY CONSULT AND SWITCH IVF TO NS AT 100/HR

## 2014-09-09 NOTE — Progress Notes (Signed)
Dr Pat Patrick on rounds. md reports may draw type and screen prior to surgery

## 2014-09-09 NOTE — Progress Notes (Signed)
TO OR VIA BED. PT SCHEDULED FOR EXPLORATORY LAP. REPORT GIVEN TO OR NURSE . URINE NOW LESS DARK.CONTINUED TO EXPERIENCE ABDOMINAL PAIN AND BLOATING. FAMILY AT BEDSIDE

## 2014-09-09 NOTE — Progress Notes (Signed)
PULSE RATE 136. BP WNL. RN SPOKE WITH DR Vianne Bulls . MD [place] 25MG  PO ONCE

## 2014-09-09 NOTE — Progress Notes (Signed)
RN SPOKE WITH DR Pat Patrick TO RELAY MESSAGE FROM DR TURNER TO CALL HER ASAP. MD REPORTS THAT PT NEEDS TO BE MADE NPO AND HE WILL CONTACT RADIOLOGIST

## 2014-09-09 NOTE — Consult Note (Signed)
Patient Demographics  Hannah [name], is a 74 y.o. female   MRN: [id number]   DOB - [birth year]  Admit Date - 09/07/2014    Outpatient Primary MD for the patient is [name]., MD  Consult requested in the Hospital by Molly Maduro, MD, On 09/09/2014    Reason for consult; lethargy   With History of -  Past Medical History  Diagnosis Date  . Hypertension   . Hyperlipidemia   . Mitral valve prolapse       Past Surgical History  Procedure Laterality Date  . Back surgery    . Abdominal hysterectomy      in for   Chief Complaint  Patient presents with  . Fall  . Hip Pain     HPI  Hannah [name]  is a 74 y.o. female,  Admitted to surgical service  For  S/p fall with right hip pain,difficulty urinating after fall with  Possible bladder rupture.but CT  Cystogram  Did not show bladder rupture.seen by  Urology,had foley placed. Medicine is consulted for lethargy.pt received phenergan  This am. Received morphine last night.after phenergan ,family noticed she is confused and talking out of her head.pt seen in room,she is alert,orineted now.having lot of suprapubic discomfort,minimal urine out put since admission,only 30-40 ml.bladder scan showed only 30 ml now.husband does not want phenergan,morphine.he also refused lovenox.she is currently taking abx for UTI prescribed by Dr.Cope,    Review of Systems    In addition to the HPI ab No Fever-chills, No Headache, No changes with Vision or hearing, No problems swallowing food or Liquids, No Chest pain, Cough or Shortness of Breath, Has suprapubic discomfort., No Blood in stool or Urine, , No new skin rashes or bruises, No new joints pains-aches,  No new weakness, tingling, numbness in any extremity, No recent weight gain or loss, No polyuria,  polydypsia or polyphagia, No significant Mental Stressors.  A full 10 point Review of Systems was done, except as stated above, all other Review of Systems were negative.   Social History History  Substance Use Topics  . Smoking status: Never Smoker   . Smokeless tobacco: Never Used  . Alcohol Use: No    Family History No family history on file.   Prior to Admission medications   Medication Sig Start Date End Date Taking? Authorizing Provider  amLODipine (NORVASC) 5 MG tablet Take 5 mg by mouth daily.   Yes Historical Provider, MD  metoprolol (LOPRESSOR) 50 MG tablet Take 50 mg by mouth 2 (two) times daily.   Yes Historical Provider, MD    Anti-infectives    Start     Dose/Rate Route Frequency Ordered Stop   09/09/14 1000  cefTRIAXone (ROCEPHIN) 1 g in dextrose 5 % 50 mL IVPB - Premix     1 g 100 mL/hr over 30 Minutes Intravenous Every 24 hours 09/09/14 0942        Scheduled Meds: . sodium chloride   Intravenous Once  . cefTRIAXone (ROCEPHIN)  IV  1 g Intravenous Q24H  . famotidine (PEPCID) IV  20 mg Intravenous Q12H  . phenazopyridine  100 mg Oral TID WC   Continuous Infusions: . dextrose 5 % and 0.45 % NaCl with KCl 20 mEq/L 100 mL/hr at 09/09/14 0926   PRN Meds:  Allergies  Allergen Reactions  . Codeine Hives  . Dilaudid [Hydromorphone Hcl] Hives  . Vicodin [Hydrocodone-Acetaminophen] Hives    Physical Exam  Vitals  Blood pressure 108/58, pulse 60, temperature 99 F (37.2 C), temperature source Oral, resp. rate 18, height 5\' 6"  (1.676 m), weight 61.236 kg (135 lb), SpO2 93 %.   1. General ;alert,oriented. lying in bed,uncomfrtable,  2. Normal affect and insight, Not Suicidal or Homicidal, Awake Alert, Oriented X 3.  3. No F.N deficits, ALL C.Nerves Intact, Strength 5/5 all 4 extremities, Sensation intact all 4 extremities, Plantars down going.  4. Ears and Eyes appear Normal, Conjunctivae clear, PERRLA. Moist Oral Mucosa.  5. Supple Neck, No JVD,  No cervical lymphadenopathy appriciated, No Carotid Bruits.  6. Symmetrical Chest wall movement, Good air movement bilaterally, CTAB.  7. RRR, No Gallops, Rubs or Murmurs, No Parasternal Heave.  8. Positive Bowel Sounds,tenderness present in suprapubic area. 9.  No Cyanosis, Normal Skin Turgor, No Skin Rash or Bruise.  10. Good muscle tone,  joints appear normal , no effusions, Normal ROM.  11. No Palpable Lymph Nodes in Neck or Axillae    Data Review  CBC  Recent Labs Lab 09/07/14 1519 09/08/14 0042  WBC 17.4* 16.9*  HGB 14.3 13.2  HCT 43.5 40.5  PLT 247 234  MCV 88.3 88.7  MCH 29.1 28.9  MCHC 33.0 32.6  RDW 14.6* 14.5   ------------------------------------------------------------------------------------------------------------------  Chemistries   Recent Labs Lab 09/07/14 1519 09/08/14 0042  NA 141 139  K 3.9 4.3  CL 107 108  CO2 25 23  GLUCOSE 133* 177*  BUN 22* 19  CREATININE 0.74 0.68  CALCIUM 8.9 8.5*  AST  --  28  ALT  --  22  ALKPHOS  --  49  BILITOT  --  0.8   ------------------------------------------------------------------------------------------------------------------ estimated creatinine clearance is 57.8 mL/min (by C-G formula based on Cr of 0.68). ------------------------------------------------------------------------------------------------------------------ No results for input(s): TSH, T4TOTAL, T3FREE, THYROIDAB in the last 72 hours.  Invalid input(s): FREET3   Coagulation profile  Recent Labs Lab 09/08/14 0042  INR 1.01   ------------------------------------------------------------------------------------------------------------------- No results for input(s): DDIMER in the last 72 hours. -------------------------------------------------------------------------------------------------------------------  Cardiac Enzymes No results for input(s): CKMB, TROPONINI, MYOGLOBIN in the last 168 hours.  Invalid input(s):  CK ------------------------------------------------------------------------------------------------------------------ Invalid input(s): POCBNP   ---------------------------------------------------------------------------------------------------------------  Urinalysis    Component Value Date/Time   COLORURINE YELLOW* 09/07/2014 1519   APPEARANCEUR CLEAR* 09/07/2014 1519   LABSPEC 1.018 09/07/2014 1519   PHURINE 5.0 09/07/2014 1519   GLUCOSEU NEGATIVE 09/07/2014 1519   HGBUR NEGATIVE 09/07/2014 1519   BILIRUBINUR NEGATIVE 09/07/2014 1519   KETONESUR TRACE* 09/07/2014 1519   PROTEINUR NEGATIVE 09/07/2014 1519   NITRITE NEGATIVE 09/07/2014 1519   LEUKOCYTESUR NEGATIVE 09/07/2014 1519     Imaging results:   Dg Pelvis 1-2 Views  09/07/2014   CLINICAL DATA:  Fall, right hip/groin pain  EXAM: PELVIS - 1-2 VIEW  COMPARISON:  None.  FINDINGS: No fracture or dislocation is seen.  Bilateral hip joint spaces are symmetric.  Visualized bony pelvis appears intact.  Mild degenerative changes the lower lumbar spine.  IMPRESSION: No fracture or dislocation is seen.   Electronically Signed   By:  Julian Hy M.D.   On: 09/07/2014 14:52   Dg Abd 1 View  09/07/2014   CLINICAL DATA:  Bladder rupture  EXAM: ABDOMEN - 1 VIEW  COMPARISON:  09/07/2014 CT scan  FINDINGS: IV contrast is seen in the renal collecting systems bilaterally as well as within the bladder, with contrast outlining a Foley catheter. Consistent with findings on CT scan, there also appears to be small volume of contrast outside of the bladder.  IMPRESSION: Findings consistent with history.   Electronically Signed   By: Skipper Cliche M.D.   On: 09/07/2014 18:32   Ct Pelvis Wo Contrast  09/07/2014   CLINICAL DATA:  Possible bladder rupture on recent CT examination  EXAM: CT PELVIS WITHOUT CONTRAST  TECHNIQUE: Multidetector CT imaging of the pelvis was performed following the standard protocol without intravenous contrast.   COMPARISON:  CT from earlier in the same day  FINDINGS: Soft tissue changes are noted in the right buttock consistent with the recent trauma. Bony structures show degenerative change of the lumbar spine. No definitive pelvic fracture is seen.  The bladder is well distended with opacified urine following clamping of the Foley catheter. Air is noted within the bladder related to the recent instrumentation. This corresponds to the air seen previously. This was likely irregular due to the decompressed nature of the bladder. Some free fluid is noted within the pelvis although no extravasation of contrast from the bladder is seen. No bladder wall irregularity is noted.  IMPRESSION: No evidence of bladder rupture. The air and fluid seen previously is felt to have been within the bladder but irregular due to the decompressed nature of the bladder.  Minimal free pelvic fluid remains. No other focal abnormality is seen.   Electronically Signed   By: Inez Catalina M.D.   On: 09/07/2014 19:16   Ct Abdomen Pelvis W Contrast  09/07/2014   ADDENDUM REPORT: 09/07/2014 17:23  ADDENDUM: Findings discussed with Dr. Karma Greaser by Dr. Nyoka Cowden at the time of dictation 09/07/14.   Electronically Signed   By: Conchita Paris M.D.   On: 09/07/2014 17:23   09/07/2014   CLINICAL DATA:  Fall, pelvic pain.  Prior hysterectomy.  EXAM: CT ABDOMEN AND PELVIS WITH CONTRAST  TECHNIQUE: Multidetector CT imaging of the abdomen and pelvis was performed using the standard protocol following bolus administration of intravenous contrast.  CONTRAST:  4mL OMNIPAQUE IOHEXOL 300 MG/ML  SOLN  COMPARISON:  10/02/2009  FINDINGS: Lower chest: Patchy dependent bibasilar airspace opacities are noted, most likely atelectasis in the absence of any pulmonary symptoms.  Hepatobiliary: 1.1 cm medial segment left hepatic lobe cyst reidentified. Hepatic hypodensity suggests steatosis. Gallbladder unremarkable.  Pancreas: Normal  Spleen: Normal  Adrenals/Urinary Tract:  Adrenal glands appear normal. Bilateral renal cortical cysts are reidentified. No hydroureteronephrosis.  Stomach/Bowel: Although there are a few mid small bowel loops in the left mid abdomen with diameter at upper limits of normal and trace surrounding fluid, no wall thickening or focal abnormality is identified. There is a small amount of fluid tracking along the pericolic gutters, mesenteric, and the pelvis. The appendix appears normal.  Vascular/Lymphatic: Mild atheromatous aortic calcification without aneurysm. No lymphadenopathy.  Reproductive: Ovaries appear normal.  Uterus surgically absent.  Other: Moderate free intraperitoneal fluid is identified as well as foci of fluid and gas at the bladder apex with adjacent air-fluid level within the bladder and a Foley catheter in place. Soft tissue stranding is noted at the inferomedial aspect of the right buttock. Incidental  note is made of prolapse of the vagina and colon below the pubococcygeal line.  Musculoskeletal: Bones are osteopenic which could mask subtle fracture, but no acute fracture is visualized.  IMPRESSION: Intraperitoneal fluid as well as gas and fluid adjacent to the bladder apex with Foley catheter in place. Findings are compatible with intraperitoneal bladder rupture.  Bones are osteopenic, but no fracture is identified.  Vaginal and rectal prolapse below the pubococcygeal line.  Soft tissue injury to the right buttock.  Electronically Signed: By: Conchita Paris M.D. On: 09/07/2014 16:50        Assessment & Plan  Active Problems:   Fall due to wet surface   Lethargy ;likley due to phenergan;d/c phenregan.use zofran for nausea. dsicontinue IV morphine,family is very concerend about her lethargy and they requested that she is not given any  narcotics and sedatives even if she is in pain,they want only tylenol. 2.possible UTI;with elevated was,recent UTI;add IV Rocephin,follow urine cultures 3.oliguria;likley due to dehydration;NS  bolus one litre now,stat chem 7,monitor UOP with foley 4.pelvic pain after fall Pelvic fluid after fall;likley physiologic   DVT Prophylaxis'SCD  AM Labs Ordered, also please review Full Orders  Family Communication: Plan discussed with patient and  husband   Thank you for the consult, we will follow the patient with you in the Hospital.   [facility] M.D on 09/09/2014 at 9:45 AM

## 2014-09-09 NOTE — Progress Notes (Signed)
Patient cont to c/o pain but does not want medication at times. PRN tylenol given x 1. PRN phenergan given for nausea w/ relief. Foley cath in place. PO intake encouraged as tolerated. No acute distress noted, will cont to monitor.

## 2014-09-09 NOTE — Progress Notes (Signed)
Subjective:   She is confused and lethargic but continues to complain of left lower quadrant suprapubic pain. She has not had any nausea this morning. She has not taken any pain medicine other than Tylenol. She was given some IV sonogram early in the morning which I suspect relates to her confusion. She seemed to be completely appropriate last night.  She was seen by urology this morning. With her negative urinalysis and her negative cystogram they have rule out bladder rupture. She's not passed any gas at this time and continues to have significant abdominal discomfort. She vomited once last evening. Her family is present and quite upset.  Vital signs in last 24 hours: Temp:  [99 F (37.2 C)-100.8 F (38.2 C)] 99 F (37.2 C) (05/31 0810) Pulse Rate:  [60-120] 60 (05/31 0810) Resp:  [16-18] 18 (05/31 0810) BP: (108-146)/(52-59) 108/58 mmHg (05/31 0810) SpO2:  [93 %-98 %] 93 % (05/31 0810) Last BM Date: 09/07/14  Intake/Output from previous day: 05/30 0701 - 05/31 0700 In: 2283 [I.V.:2233; IV Piggyback:50] Out: 625 [Urine:625]  Exam:  On exam she has some moderate lower abdominal tenderness primarily on the left side and suprapubic area with guarding but no rebound. She has some mild abdominal distention. She has hypoactive but present bowel sounds. She does not have any tenderness at all. She is moderately lethargic this morning. She does answer questions when aroused that appears to be appropriate for the most part.  Lab Results:  CBC  Recent Labs  09/07/14 1519 09/08/14 0042  WBC 17.4* 16.9*  HGB 14.3 13.2  HCT 43.5 40.5  PLT 247 234   CMP     Component Value Date/Time   NA 139 09/08/2014 0042   K 4.3 09/08/2014 0042   CL 108 09/08/2014 0042   CO2 23 09/08/2014 0042   GLUCOSE 177* 09/08/2014 0042   BUN 19 09/08/2014 0042   CREATININE 0.68 09/08/2014 0042   CALCIUM 8.5* 09/08/2014 0042   PROT 6.6 09/08/2014 0042   ALBUMIN 3.6 09/08/2014 0042   AST 28 09/08/2014 0042    ALT 22 09/08/2014 0042   ALKPHOS 49 09/08/2014 0042   BILITOT 0.8 09/08/2014 0042   GFRNONAA >60 09/08/2014 0042   GFRAA >60 09/08/2014 0042   PT/INR  Recent Labs  09/08/14 0042  LABPROT 13.5  INR 1.01    Studies/Results: Dg Pelvis 1-2 Views  09/07/2014   CLINICAL DATA:  Fall, right hip/groin pain  EXAM: PELVIS - 1-2 VIEW  COMPARISON:  None.  FINDINGS: No fracture or dislocation is seen.  Bilateral hip joint spaces are symmetric.  Visualized bony pelvis appears intact.  Mild degenerative changes the lower lumbar spine.  IMPRESSION: No fracture or dislocation is seen.   Electronically Signed   By: Julian Hy M.D.   On: 09/07/2014 14:52   Dg Abd 1 View  09/07/2014   CLINICAL DATA:  Bladder rupture  EXAM: ABDOMEN - 1 VIEW  COMPARISON:  09/07/2014 CT scan  FINDINGS: IV contrast is seen in the renal collecting systems bilaterally as well as within the bladder, with contrast outlining a Foley catheter. Consistent with findings on CT scan, there also appears to be small volume of contrast outside of the bladder.  IMPRESSION: Findings consistent with history.   Electronically Signed   By: Skipper Cliche M.D.   On: 09/07/2014 18:32   Ct Pelvis Wo Contrast  09/07/2014   CLINICAL DATA:  Possible bladder rupture on recent CT examination  EXAM: CT PELVIS WITHOUT CONTRAST  TECHNIQUE: Multidetector CT imaging of the pelvis was performed following the standard protocol without intravenous contrast.  COMPARISON:  CT from earlier in the same day  FINDINGS: Soft tissue changes are noted in the right buttock consistent with the recent trauma. Bony structures show degenerative change of the lumbar spine. No definitive pelvic fracture is seen.  The bladder is well distended with opacified urine following clamping of the Foley catheter. Air is noted within the bladder related to the recent instrumentation. This corresponds to the air seen previously. This was likely irregular due to the decompressed  nature of the bladder. Some free fluid is noted within the pelvis although no extravasation of contrast from the bladder is seen. No bladder wall irregularity is noted.  IMPRESSION: No evidence of bladder rupture. The air and fluid seen previously is felt to have been within the bladder but irregular due to the decompressed nature of the bladder.  Minimal free pelvic fluid remains. No other focal abnormality is seen.   Electronically Signed   By: Inez Catalina M.D.   On: 09/07/2014 19:16   Ct Abdomen Pelvis W Contrast  09/07/2014   ADDENDUM REPORT: 09/07/2014 17:23  ADDENDUM: Findings discussed with Dr. Karma Greaser by Dr. Nyoka Cowden at the time of dictation 09/07/14.   Electronically Signed   By: Conchita Paris M.D.   On: 09/07/2014 17:23   09/07/2014   CLINICAL DATA:  Fall, pelvic pain.  Prior hysterectomy.  EXAM: CT ABDOMEN AND PELVIS WITH CONTRAST  TECHNIQUE: Multidetector CT imaging of the abdomen and pelvis was performed using the standard protocol following bolus administration of intravenous contrast.  CONTRAST:  30mL OMNIPAQUE IOHEXOL 300 MG/ML  SOLN  COMPARISON:  10/02/2009  FINDINGS: Lower chest: Patchy dependent bibasilar airspace opacities are noted, most likely atelectasis in the absence of any pulmonary symptoms.  Hepatobiliary: 1.1 cm medial segment left hepatic lobe cyst reidentified. Hepatic hypodensity suggests steatosis. Gallbladder unremarkable.  Pancreas: Normal  Spleen: Normal  Adrenals/Urinary Tract: Adrenal glands appear normal. Bilateral renal cortical cysts are reidentified. No hydroureteronephrosis.  Stomach/Bowel: Although there are a few mid small bowel loops in the left mid abdomen with diameter at upper limits of normal and trace surrounding fluid, no wall thickening or focal abnormality is identified. There is a small amount of fluid tracking along the pericolic gutters, mesenteric, and the pelvis. The appendix appears normal.  Vascular/Lymphatic: Mild atheromatous aortic calcification  without aneurysm. No lymphadenopathy.  Reproductive: Ovaries appear normal.  Uterus surgically absent.  Other: Moderate free intraperitoneal fluid is identified as well as foci of fluid and gas at the bladder apex with adjacent air-fluid level within the bladder and a Foley catheter in place. Soft tissue stranding is noted at the inferomedial aspect of the right buttock. Incidental note is made of prolapse of the vagina and colon below the pubococcygeal line.  Musculoskeletal: Bones are osteopenic which could mask subtle fracture, but no acute fracture is visualized.  IMPRESSION: Intraperitoneal fluid as well as gas and fluid adjacent to the bladder apex with Foley catheter in place. Findings are compatible with intraperitoneal bladder rupture.  Bones are osteopenic, but no fracture is identified.  Vaginal and rectal prolapse below the pubococcygeal line.  Soft tissue injury to the right buttock.  Electronically Signed: By: Conchita Paris M.D. On: 09/07/2014 16:50    Assessment/Plan: The urology service recommended internal medicine consult for her oliguria and confusion. I have spoken with medical doctors. I do not think the patient's confusion his metabolic but more likely  related to her recent Phenergan dose. She was very appropriate last night on her follow-up examination. Persistent lower abdominal pelvic tenderness is worrisome. We will repeat her laboratory values this morning and I think we will repeat her CT scan. Output is low but I'm not convinced is due to any urinary insufficiency. Rather I think she is more likely to be behind on her fluids and a fluid bolus would be in order. The family is refusing any other pain medications for fear of reaction to the pain medicine. I've asked him to consider allowing [place] to give her some sort of narcotic medication and dealing with the side effects as necessary because she continues to have such significant pain. I discussed this plan with the patient with the  medical service and with the nursing service.

## 2014-09-09 NOTE — Anesthesia Postprocedure Evaluation (Signed)
  Anesthesia Post-op Note  Patient: Hannah [name]  Procedure(s) Performed: Procedure(s): EXPLORATORY LAPAROTOMY, small bowel resection (N/A) , bladder instilation CENTRAL LINE INSERTION (Right)  Anesthesia type:General  Patient location: PACU  Post pain: Pain level controlled  Post assessment: Post-op Vital signs reviewed, Patient's Cardiovascular Status Stable, Respiratory Function Stable, Patent Airway and No signs of Nausea or vomiting  Post vital signs: Reviewed and stable  Last Vitals:  Filed Vitals:   09/09/14 1451  BP: 126/57  Pulse: 117  Temp: 37.3 C  Resp: 18    Level of consciousness: awake, alert  and patient cooperative  Complications: No apparent anesthesia complications

## 2014-09-09 NOTE — Op Note (Signed)
09/07/2014 - 09/09/2014  7:28 PM  PATIENT:  Hannah [name]  74 y.o. female  PRE-OPERATIVE DIAGNOSIS:  Perforated bowel possible bladder  POST-OPERATIVE DIAGNOSIS:  perforated small bowel  PROCEDURE:  Procedure(s): EXPLORATORY LAPAROTOMY, small bowel resection (N/A) , bladder instilation CENTRAL LINE INSERTION (Right)  SURGEON:  Surgeon(s) and Role:    * Jeanie Cooks, MD - Primary    * Nestor Lewandowsky, MD - Assisting   ASSISTANTS: [place]   ANESTHESIA:   general  EBL:  Total I/O In: -  Out: 25 [Blood:25]   DRAINS: Penrose drain in the Abdominal wound   LOCAL MEDICATIONS USED:  NONE   DISPOSITION OF SPECIMEN:  PATHOLOGY   DICTATION: With the patient supine position infection appropriate general anesthesia the patient was prepped ChloraPrep and draped sterile towels. A lower midline incision was made from just above the umbilicus to the suprapubic area and carried down to the subcutaneous taste tissue Bovie electrocautery. Midline fascia was identified and opened the length skin incision peritoneum was then a final length skin incision.  Free fluid was found immediately and a foul odor emanated from the wound initially. There was significant fibrinous exudate over large portions small bowel. Bladder was easily identified by the Foley did not appear to be any bladder injury. Careful manipulation of the pelvis identified a large pelvic abscess and there was a rush of free air once the abscess was entered. This fluid was cultured. The small bowel was elevated and the incision and there was a 1 cm rupture in the antimesenteric border of the distal small bowel. This area was photographed and marked.  Attention was then turned the remainder the bowel which was run from the ligament of Treitz to the ileocecal valve. Other than some moderate fibrinous exudate no other significant changes. There is no evidence of ischemia or small bowel tumor. The colon was evaluated in the right colon  sigmoid with transverse was not visualized.  The bladder was then instilled with 250 cc of saline colored with methylene blue. The bladder was adequately distended and no bladder injuries were encountered. The bladder was then drained.  Because of the unknown pathology and unclear etiology for this probably elected to perform a small bowel resection. An area was chosen on each side of the perforation in the bowel stapled using the GIA 55 stapling device. Mesentery was taken down between clamps and ligated with 3-0 Vicryl. The specimen was passed off the table after being photographed again. The 2 loops valve placed side-by-side. Small enterotomy was made in each loop of bowel and the GIA 55 stapling device used to create a functional end to end anastomosis. There was no bleeding from the staple line. The enterotomy was closed with a single application of the TA 60 stapling device carrying a blue load. Mesenteric defect was closed with 3-0 Vicryl in a running fashion. There was clear several centimeter anastomosis. The bowel contents returned to anatomic position.  The abdomen was copiously irrigated with warm saline solution. Bowel contents returned to their anatomic position. A visceral retractor was used to protect the bowel. The midline fascia was closed with small figure-of-eight sutures of 0 Maxon. The visceral retractor was removed. A drain was placed in the basin incision and clips applied. Sterile dressings were applied.  A right internal jugular central line was placed using ultrasound guidance on a single pass under direct vision. A wire was passed and the great vessel system incision enlarged and a dilator placed dilator removed  and a triple-lumen catheter inserted over the wire. The catheter was flushed and aspirated and secured in place. Sterile dressing was applied. The patient was then returned recovery room having tolerated the procedure reasonably well. All counts were correct at the end of  the procedure.  PLAN OF CARE: Admit to inpatient   PATIENT DISPOSITION:  PACU - guarded condition.   Dia Crawford III, MD

## 2014-09-09 NOTE — Progress Notes (Addendum)
RN SPOKE WITH PA SHANNON  [place]. PT WITH BLADDER SPASMS. MD ORDERS PYRIDIUM 100MG  PO TID PRN

## 2014-09-09 NOTE — Anesthesia Procedure Notes (Signed)
Procedure Name: Intubation Date/Time: 09/09/2014 5:47 PM Performed by: Rolla Plate Pre-anesthesia Checklist: Patient identified, Emergency Drugs available, Suction available, Patient being monitored and Timeout performed Patient Re-evaluated:Patient Re-evaluated prior to inductionOxygen Delivery Method: Circle system utilized Preoxygenation: Pre-oxygenation with 100% oxygen Intubation Type: IV induction, Cricoid Pressure applied and Rapid sequence Laryngoscope Size: Miller and 2 Grade View: Grade I Tube type: Oral Number of attempts: 1 Placement Confirmation: ETT inserted through vocal cords under direct vision,  positive ETCO2 and breath sounds checked- equal and bilateral Secured at: 20 cm Tube secured with: Tape Dental Injury: Teeth and Oropharynx as per pre-operative assessment

## 2014-09-09 NOTE — Care Management (Addendum)
RNCM stopped by room and patient's husband immediately stated complaining about "patient receiving drugs that he asked/patient asked not to be given". I noticed that when I walked in the room patient was talking and smiling/kissing her husband. When I tried to talk to patient she had her eyes closed and would not answer me. Her husband kept saying "she can't talk cause they gave her that medicine I told them not to give".  Patient's daughter in law is also in the room and kept rolling her eyes/shaking her head to patient's husband's behavior it seemed. While I was asking husband about living situation he started talking about patient "working 3 jobs". 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 I asked the patient if she was able to talk now, and she again closed her eyes and muttered "I'll try". I told patient that she needed to get up to chair today. Her husband again started complaining and patient's daughter in law rolled her eyes and shook her head agreeing with patient getting out of bed.

## 2014-09-09 NOTE — Progress Notes (Signed)
RN RECEIVED CALL FROM RADIOLOGIST DR Radford Pax AT [phone]  WHO READ CT SCAN. MD STATES SCAN SHOWS BOWEL INJURY ,INCREASED FLUID /ABDSCESS IN ABDOMEN AND RECTAL WALL THICKENING. REPORTS WORSENING  HYDRONEPHROSIS. I/S RN TO HAVE DR ELY PAGE HER ASAP

## 2014-09-09 NOTE — Progress Notes (Signed)
Urology Consult Follow Up  Subjective: This Hannah [name] is a 74 year old white female who suffered a fall on 09/07/2014 from a standing position. Her pain continued to increase throughout the day and she is brought to the emergency department. In the emergency room, a CT scan with contrast was performed and there were findgs of a possible bladder rupture.  A follow-up dedicated CT urogram did not demonstrate any contrast extravasation. Her UA was negative for RBCs upon admission. Today upon entering the room, the husband is a concern and upset about the patient's change in mental status. He wants "something done about it right now". Will aspect to the patient she was able to voiced that she was still having discomfort in the lower abdomen she then began to speak about her nausea which she states has started after her surgery. She has not had surgery this admission. The nursing staff stated they had found her in the room naked standing in front of her bathroom door this morning a Foley catheter was placed last evening for acute urinary retention.   Anti-infectives: Anti-infectives    None      Current Facility-Administered Medications  Medication Dose Route Frequency Provider Last Rate Last Dose  . acetaminophen (TYLENOL) tablet 650 mg  650 mg Oral Q6H PRN Molly Maduro, MD   325 mg at 09/09/14 0106   Or  . acetaminophen (TYLENOL) suppository 650 mg  650 mg Rectal Q6H PRN Molly Maduro, MD      . dextrose 5 % and 0.45 % NaCl with KCl 20 mEq/L infusion   Intravenous Continuous Molly Maduro, MD 100 mL/hr at 09/08/14 2220    . diphenhydrAMINE (BENADRYL) injection 12.5 mg  12.5 mg Intravenous Q6H PRN Molly Maduro, MD       Or  . diphenhydrAMINE (BENADRYL) 12.5 MG/5ML elixir 12.5 mg  12.5 mg Oral Q6H PRN Molly Maduro, MD      . famotidine (PEPCID) IVPB 20 mg premix  20 mg Intravenous Q12H Molly Maduro, MD   20 mg at 09/08/14 2220  . morphine 2 MG/ML injection 2-8 mg  2-8 mg  Intravenous Q1H PRN Molly Maduro, MD   2 mg at 09/08/14 0459  . oxyCODONE (Oxy IR/ROXICODONE) immediate release tablet 10 mg  10 mg Oral Q4H PRN Dia Crawford III, MD      . promethazine (PHENERGAN) tablet 25 mg  25 mg Oral Q6H PRN Molly Maduro, MD   25 mg at 09/08/14 6759   Or  . promethazine (PHENERGAN) injection 25 mg  25 mg Intravenous Q6H PRN Molly Maduro, MD   25 mg at 09/09/14 0537     Objective: Vital signs in last 24 hours: Temp:  [99 F (37.2 C)-100.8 F (38.2 C)] 100.5 F (38.1 C) (05/31 0417) Pulse Rate:  [97-120] 120 (05/31 0417) Resp:  [16-18] 17 (05/31 0417) BP: (130-148)/(52-59) 142/56 mmHg (05/31 0417) SpO2:  [96 %-98 %] 96 % (05/31 0417)  Intake/Output from previous day: 05/30 0701 - 05/31 0700 In: 2283 [I.V.:2233; IV Piggyback:50] Out: 625 [Urine:625] Intake/Output this shift:     Physical Exam Heart: Tachycardic, S1, S2, without murmurs rubs or gallops Lungs: Clear to auscultation bilaterally Abdomen: Soft, tender in suprapubic region, nondistended, no guarding  GU: Foley in place, draining orange  tinged urine Lower extremities: Mild pedal edema, good dorsal pulses bilaterally, no calf tenderness  Lab Results:   Recent Labs  09/07/14 1519 09/08/14 0042  WBC 17.4* 16.9*  HGB 14.3 13.2  HCT 43.5 40.5  PLT 247  234   BMET  Recent Labs  09/07/14 1519 09/08/14 0042  NA 141 139  K 3.9 4.3  CL 107 108  CO2 25 23  GLUCOSE 133* 177*  BUN 22* 19  CREATININE 0.74 0.68  CALCIUM 8.9 8.5*   PT/INR  Recent Labs  09/08/14 0042  LABPROT 13.5  INR 1.01   ABG No results for input(s): PHART, HCO3 in the last 72 hours.  Invalid input(s): PCO2, PO2  Studies/Results: Dg Pelvis 1-2 Views  09/07/2014   CLINICAL DATA:  Fall, right hip/groin pain  EXAM: PELVIS - 1-2 VIEW  COMPARISON:  None.  FINDINGS: No fracture or dislocation is seen.  Bilateral hip joint spaces are symmetric.  Visualized bony pelvis appears intact.  Mild degenerative  changes the lower lumbar spine.  IMPRESSION: No fracture or dislocation is seen.   Electronically Signed   By: Julian Hy M.D.   On: 09/07/2014 14:52   Dg Abd 1 View  09/07/2014   CLINICAL DATA:  Bladder rupture  EXAM: ABDOMEN - 1 VIEW  COMPARISON:  09/07/2014 CT scan  FINDINGS: IV contrast is seen in the renal collecting systems bilaterally as well as within the bladder, with contrast outlining a Foley catheter. Consistent with findings on CT scan, there also appears to be small volume of contrast outside of the bladder.  IMPRESSION: Findings consistent with history.   Electronically Signed   By: Skipper Cliche M.D.   On: 09/07/2014 18:32   Ct Pelvis Wo Contrast  09/07/2014   CLINICAL DATA:  Possible bladder rupture on recent CT examination  EXAM: CT PELVIS WITHOUT CONTRAST  TECHNIQUE: Multidetector CT imaging of the pelvis was performed following the standard protocol without intravenous contrast.  COMPARISON:  CT from earlier in the same day  FINDINGS: Soft tissue changes are noted in the right buttock consistent with the recent trauma. Bony structures show degenerative change of the lumbar spine. No definitive pelvic fracture is seen.  The bladder is well distended with opacified urine following clamping of the Foley catheter. Air is noted within the bladder related to the recent instrumentation. This corresponds to the air seen previously. This was likely irregular due to the decompressed nature of the bladder. Some free fluid is noted within the pelvis although no extravasation of contrast from the bladder is seen. No bladder wall irregularity is noted.  IMPRESSION: No evidence of bladder rupture. The air and fluid seen previously is felt to have been within the bladder but irregular due to the decompressed nature of the bladder.  Minimal free pelvic fluid remains. No other focal abnormality is seen.   Electronically Signed   By: Inez Catalina M.D.   On: 09/07/2014 19:16   Ct Abdomen Pelvis W  Contrast  09/07/2014   ADDENDUM REPORT: 09/07/2014 17:23  ADDENDUM: Findings discussed with Dr. Karma Greaser by Dr. Nyoka Cowden at the time of dictation 09/07/14.   Electronically Signed   By: Conchita Paris M.D.   On: 09/07/2014 17:23   09/07/2014   CLINICAL DATA:  Fall, pelvic pain.  Prior hysterectomy.  EXAM: CT ABDOMEN AND PELVIS WITH CONTRAST  TECHNIQUE: Multidetector CT imaging of the abdomen and pelvis was performed using the standard protocol following bolus administration of intravenous contrast.  CONTRAST:  52mL OMNIPAQUE IOHEXOL 300 MG/ML  SOLN  COMPARISON:  10/02/2009  FINDINGS: Lower chest: Patchy dependent bibasilar airspace opacities are noted, most likely atelectasis in the absence of any pulmonary symptoms.  Hepatobiliary: 1.1 cm medial segment left hepatic lobe cyst  reidentified. Hepatic hypodensity suggests steatosis. Gallbladder unremarkable.  Pancreas: Normal  Spleen: Normal  Adrenals/Urinary Tract: Adrenal glands appear normal. Bilateral renal cortical cysts are reidentified. No hydroureteronephrosis.  Stomach/Bowel: Although there are a few mid small bowel loops in the left mid abdomen with diameter at upper limits of normal and trace surrounding fluid, no wall thickening or focal abnormality is identified. There is a small amount of fluid tracking along the pericolic gutters, mesenteric, and the pelvis. The appendix appears normal.  Vascular/Lymphatic: Mild atheromatous aortic calcification without aneurysm. No lymphadenopathy.  Reproductive: Ovaries appear normal.  Uterus surgically absent.  Other: Moderate free intraperitoneal fluid is identified as well as foci of fluid and gas at the bladder apex with adjacent air-fluid level within the bladder and a Foley catheter in place. Soft tissue stranding is noted at the inferomedial aspect of the right buttock. Incidental note is made of prolapse of the vagina and colon below the pubococcygeal line.  Musculoskeletal: Bones are osteopenic which could mask  subtle fracture, but no acute fracture is visualized.  IMPRESSION: Intraperitoneal fluid as well as gas and fluid adjacent to the bladder apex with Foley catheter in place. Findings are compatible with intraperitoneal bladder rupture.  Bones are osteopenic, but no fracture is identified.  Vaginal and rectal prolapse below the pubococcygeal line.  Soft tissue injury to the right buttock.  Electronically Signed: By: Conchita Paris M.D. On: 09/07/2014 16:50     Assessment:  74 year old white female who is admitted to the emergency room after suffering a fall from standing position. On initial CT on the findings worrisome for a possible bladder rupture follow-up CT urogram did not demonstrate extravasation of contrast from the bladder and no blood was seen on the initial UA.   She had an episode of urinary retention last evening with placement of Foley catheter. Foley will be remaining in place until time of discharge when she will be arranged for reach an outpatient setting.   Patient has had a change in mental status and husband is very upset and concerned about this, I will order a urine culture to rule out infection as a source of mental confusion. A medical consult is ordered today at the behest of the nursing staff due to patient's husband being very upset and wanting some action taken.  Will follow. Pt and family updated on plan.          LOS: 2 days    [facility] 09/09/2014

## 2014-09-09 NOTE — Progress Notes (Signed)
Free fluid in the pelvis strongly suggestive of an intestinal or bladder injury. Since there was some question of a potential bladder injury 48 hours ago on her initial CT scan I feel it is likely she has some sort of disruption of her bladder wall. However, we cannot rule out a bowel injury in this particular setting. She is lethargic with significant abdominal pain and distention mild tachycardia not responsive to beta blockers. I have discussed the situation with the family and patient in detail. I reviewed the CT scan with the radiologist. I feel that she needs laparotomy and repair of whatever injury his possible for this constellation of symptoms. The family is going to make a decision and we will do surgery in the near future.

## 2014-09-09 NOTE — Anesthesia Preprocedure Evaluation (Addendum)
Anesthesia Evaluation  Patient identified by MRN, date of birth, ID band Patient confused    Reviewed: Allergy & Precautions, NPO status , Patient's Chart, lab work & pertinent test results  History of Anesthesia Complications (+) PONV  Airway Mallampati: I  TM Distance: >3 FB Neck ROM: Full    Dental  (+) Edentulous Upper, Edentulous Lower   Pulmonary          Cardiovascular hypertension, Pt. on medications and Pt. on home beta blockers + dysrhythmias Supra Ventricular Tachycardia + Valvular Problems/Murmurs MVP     Neuro/Psych    GI/Hepatic   Endo/Other    Renal/GU      Musculoskeletal   Abdominal   Peds  Hematology   Anesthesia Other Findings   Reproductive/Obstetrics                           Anesthesia Physical Anesthesia Plan  ASA: II and emergent  Anesthesia Plan: General   Post-op Pain Management:    Induction: Intravenous  Airway Management Planned: Oral ETT  Additional Equipment:   Intra-op Plan:   Post-operative Plan: Possible Post-op intubation/ventilation and Extubation in OR  Informed Consent: I have reviewed the patients History and Physical, chart, labs and discussed the procedure including the risks, benefits and alternatives for the proposed anesthesia with the patient or authorized representative who has indicated his/her understanding and acceptance.     Plan Discussed with:   Anesthesia Plan Comments:        Anesthesia Quick Evaluation

## 2014-09-09 NOTE — Progress Notes (Signed)
HEArt rate remaisn above 120. rn spoke with dr Vianne Bulls , md orders toprol 50mg  po once. Pt experieincing pain but spouse refuses tylenol 650mg  po wants 1 tylenol . md orders 325mg  po once tylenol

## 2014-09-10 ENCOUNTER — Encounter: Payer: Self-pay | Admitting: Surgery

## 2014-09-10 LAB — COMPREHENSIVE METABOLIC PANEL
ALBUMIN: 2.3 g/dL — AB (ref 3.5–5.0)
ALT: 11 U/L — ABNORMAL LOW (ref 14–54)
ANION GAP: 4 — AB (ref 5–15)
AST: 14 U/L — AB (ref 15–41)
Alkaline Phosphatase: 44 U/L (ref 38–126)
BILIRUBIN TOTAL: 2.1 mg/dL — AB (ref 0.3–1.2)
BUN: 12 mg/dL (ref 6–20)
CALCIUM: 7.7 mg/dL — AB (ref 8.9–10.3)
CO2: 24 mmol/L (ref 22–32)
CREATININE: 0.54 mg/dL (ref 0.44–1.00)
Chloride: 104 mmol/L (ref 101–111)
GFR calc Af Amer: >60 mL/min (ref >60)
GFR calc non Af Amer: >60 mL/min (ref >60)
Glucose, Bld: 152 mg/dL — ABNORMAL HIGH (ref 65–99)
Potassium: 4 mmol/L (ref 3.5–5.1)
SODIUM: 132 mmol/L — AB (ref 135–145)
Total Protein: 5.2 g/dL — ABNORMAL LOW (ref 6.5–8.1)

## 2014-09-10 LAB — CBC
HCT: 36.3 % (ref 35.0–47.0)
Hemoglobin: 12.1 g/dL (ref 12.0–16.0)
MCH: 29.7 pg (ref 26.0–34.0)
MCHC: 33.2 g/dL (ref 32.0–36.0)
MCV: 89.3 fL (ref 80.0–100.0)
Platelets: 211 10*3/uL (ref 150–440)
RBC: 4.06 MIL/uL (ref 3.80–5.20)
RDW: 14.6 % — ABNORMAL HIGH (ref 11.5–14.5)
WBC: 13.1 10*3/uL — ABNORMAL HIGH (ref 3.6–11.0)

## 2014-09-10 MED ORDER — METOPROLOL TARTRATE 50 MG PO TABS
50.0000 mg | ORAL_TABLET | Freq: Two times a day (BID) | ORAL | Status: DC
  Administered 2014-09-10 (×2): 50 mg via ORAL
  Filled 2014-09-10 (×2): qty 1

## 2014-09-10 MED ORDER — AMLODIPINE BESYLATE 5 MG PO TABS
5.0000 mg | ORAL_TABLET | Freq: Every day | ORAL | Status: DC
  Administered 2014-09-10: 5 mg via ORAL
  Filled 2014-09-10: qty 1

## 2014-09-10 MED ORDER — DEXTROSE-NACL 5-0.45 % IV SOLN
INTRAVENOUS | Status: DC
  Administered 2014-09-10 – 2014-09-12 (×4): via INTRAVENOUS

## 2014-09-10 MED ORDER — FENTANYL 50 MCG/HR TD PT72
50.0000 ug | MEDICATED_PATCH | TRANSDERMAL | Status: DC
  Administered 2014-09-10: 50 ug via TRANSDERMAL
  Filled 2014-09-10: qty 1

## 2014-09-10 MED ORDER — TRAMADOL HCL 50 MG PO TABS
100.0000 mg | ORAL_TABLET | Freq: Four times a day (QID) | ORAL | Status: DC
  Administered 2014-09-10 – 2014-09-15 (×14): 100 mg via ORAL
  Filled 2014-09-10 (×15): qty 2

## 2014-09-10 MED ORDER — METOPROLOL TARTRATE 1 MG/ML IV SOLN: 5.0000 mg | Freq: Three times a day (TID) | INTRAVENOUS | Status: DC

## 2014-09-10 MED ORDER — TRAMADOL HCL 50 MG PO TABS
50.0000 mg | ORAL_TABLET | Freq: Four times a day (QID) | ORAL | Status: DC | PRN
  Administered 2014-09-12: 50 mg via ORAL
  Filled 2014-09-10 (×4): qty 1

## 2014-09-10 NOTE — Consult Note (Signed)
Discuss case with Dr. Pat Patrick yesterday per his following call.  He informed me that he did not think consultation was necessary from Cardiology as the tachycardia was secondary to her surgical urgency.  Reconsultation should further cardiovascular   Input be desired.

## 2014-09-10 NOTE — Progress Notes (Signed)
Urology Consult Follow Up  Subjective: POD 1 s/p ex lap found to have small bowel injury/ pelvic abscess, no bladder injury.  Reports feeling better this AM.  CT scan showed bilateral hydronephrosis despite Foley yesterday.    Anti-infectives: Anti-infectives    Start     Dose/Rate Route Frequency Ordered Stop   09/09/14 2000  metroNIDAZOLE (FLAGYL) IVPB 500 mg     500 mg 100 mL/hr over 60 Minutes Intravenous Every 8 hours 09/09/14 1948     09/09/14 1000  cefTRIAXone (ROCEPHIN) 1 g in dextrose 5 % 50 mL IVPB - Premix     1 g 100 mL/hr over 30 Minutes Intravenous Every 24 hours 09/09/14 0942        Current Facility-Administered Medications  Medication Dose Route Frequency Provider Last Rate Last Dose  . acetaminophen (TYLENOL) tablet 650 mg  650 mg Oral Q6H PRN Molly Maduro, MD   650 mg at 09/10/14 0516   Or  . acetaminophen (TYLENOL) suppository 650 mg  650 mg Rectal Q6H PRN Molly Maduro, MD      . amLODipine (NORVASC) tablet 5 mg  5 mg Oral Daily Dia Crawford III, MD   5 mg at 09/10/14 0941  . cefTRIAXone (ROCEPHIN) 1 g in dextrose 5 % 50 mL IVPB - Premix  1 g Intravenous Q24H Epifanio Lesches, MD   1 g at 09/10/14 1146  . dextrose 5 %-0.45 % sodium chloride infusion   Intravenous Continuous Dia Crawford III, MD 150 mL/hr at 09/10/14 [phone]    . diphenhydrAMINE (BENADRYL) injection 25 mg  25 mg Intravenous Q6H PRN Dia Crawford III, MD      . famotidine (PEPCID) IVPB 20 mg premix  20 mg Intravenous Q12H Molly Maduro, MD   20 mg at 09/10/14 1229  . fentaNYL (DURAGESIC - dosed mcg/hr) 50 mcg  50 mcg Transdermal Q72H Dia Crawford III, MD   50 mcg at 09/10/14 [phone]  . fentaNYL (SUBLIMAZE) injection 25 mcg  25 mcg Intravenous Q5 min PRN Gunnar Fusi, MD      . HYDROmorphone (DILAUDID) injection 1 mg  1 mg Intravenous Q2H PRN Dia Crawford III, MD      . metoprolol (LOPRESSOR) tablet 50 mg  50 mg Oral BID Dia Crawford III, MD   50 mg at 09/10/14 0941  . metroNIDAZOLE (FLAGYL) IVPB 500 mg  500  mg Intravenous Q8H Dia Crawford III, MD   500 mg at 09/10/14 1010  . ondansetron (ZOFRAN) injection 4 mg  4 mg Intravenous Q6H PRN Epifanio Lesches, MD   4 mg at 09/09/14 1229  . phenazopyridine (PYRIDIUM) tablet 100 mg  100 mg Oral TID WC Epifanio Lesches, MD   100 mg at 09/10/14 1247  . traMADol (ULTRAM) tablet 100 mg  100 mg Oral 4 times per day Dia Crawford III, MD   100 mg at 09/10/14 1200  . traMADol (ULTRAM) tablet 50 mg  50 mg Oral Q6H PRN Marlyce Huge, MD         Objective: Vital signs in last 24 hours: Temp:  [98.4 F (36.9 C)-99.3 F (37.4 C)] 98.6 F (37 C) (06/01 1241) Pulse Rate:  [73-133] 122 (06/01 1241) Resp:  [16-26] 22 (06/01 1241) BP: (101-151)/(49-81) 143/68 mmHg (06/01 1241) SpO2:  [88 %-97 %] 95 % (06/01 1241) Weight:  [151 lb 8 oz (68.72 kg)] 151 lb 8 oz (68.72 kg) (05/31 2054)  Intake/Output from previous day: 05/31 0701 - 06/01 0700 In: 1966.7 [P.O.:120; I.V.:1846.7] Out: 1790 [Urine:1525;  Blood:25] Intake/Output this shift: Total I/O In: 480 [I.V.:380; IV Piggyback:100] Out: 950 [Urine:950]   Physical Exam  Constitutional: She is oriented to person, place, and time and well-developed, well-nourished, and in no distress.  HENT:  Head: Normocephalic and atraumatic.  Neck: Normal range of motion. Neck supple.  Abdominal: Soft. There is tenderness. There is no rebound and no guarding.  Lower abdominal dressing, c/d/i without strike through  Genitourinary:  Foley draining concentrated appearing urine  Neurological: She is alert and oriented to person, place, and time.    Lab Results:   Recent Labs  09/09/14 1048 09/10/14 0512  WBC 13.4* 13.1*  HGB 13.3 12.1  HCT 40.5 36.3  PLT 237 211   BMET  Recent Labs  09/09/14 1048 09/10/14 0512  NA 134* 132*  K 4.3 4.0  CL 101 104  CO2 24 24  GLUCOSE 161* 152*  BUN 15 12  CREATININE 0.61 0.54  CALCIUM 8.4* 7.7*   PT/INR  Recent Labs  09/08/14 0042  LABPROT 13.5  INR 1.01    ABG No results for input(s): PHART, HCO3 in the last 72 hours.  Invalid input(s): PCO2, PO2  Studies/Results: Dg Chest 1 View  09/09/2014   CLINICAL DATA:  Bedside central venous catheter placement.  EXAM: CHEST  1 VIEW  COMPARISON:  CT chest 12/10/2013, 04/09/2013. Two-view chest x-ray 01/15/2010.  FINDINGS: Portable AP erect examination. Markedly suboptimal inspiration accounts for atelectasis at the lung bases. Right jugular central venous catheter tip projects at or near the cavoatrial junction. No evidence of pneumothorax or mediastinal hematoma. Heart size normal for technique and degree of inspiration. Lungs otherwise clear.  IMPRESSION: 1. Right jugular central venous catheter tip projects at or near the cavoatrial junction. No acute complicating features. 2. Markedly suboptimal inspiration accounts for bibasilar atelectasis. No acute cardiopulmonary disease otherwise.   Electronically Signed   By: Evangeline Dakin M.D.   On: 09/09/2014 20:00   Ct Abdomen Pelvis W Contrast  09/09/2014   CLINICAL DATA:  Patient unable to urinate for 24 hours with abdominal pain, nausea vomiting. The patient rib recently felt on a when rock and head pelvic pain, prompting her to seek treatment on 09/07/2014. On that day, they were CT findings in the pelvis suggestive of a possible urinary bladder injury. A pelvic CT and a cystoscopy have since been performed, showing no evidence of urinary bladder injury. The patient's nurse and Dr. Pat Patrick report to me that the patient has had a decrease in urine output.  EXAM: CT ABDOMEN AND PELVIS WITH CONTRAST  TECHNIQUE: Multidetector CT imaging of the abdomen and pelvis was performed using the standard protocol following bolus administration of intravenous contrast.  CONTRAST:  157mL OMNIPAQUE IOHEXOL 300 MG/ML  SOLN  COMPARISON:  CT pelvis without contrast 09/07/2014, CT abdomen pelvis 09/07/2014  FINDINGS: Lung bases: Small simple appearing left pleural effusion posteriorly.  There significant atelectasis in the medial lower lobes bilaterally.  Normal adrenal glands, spleen, and pancreas. The gallbladder contains some high density within its lumen that is new compared to prior CT, most consistent with excretion of previous intravenous contrast into the bile. There is a stable 10 mm hypodensity in the right hepatic lobe, likely reflecting a benign cyst. No intrahepatic biliary ductal dilatation.  There is bilateral moderate hydroureteronephrosis; this is new since the CT of 09/07/2014. Both ureters are dilated to at least the level of the iliac crests, and become difficult to follow lower in the pelvis. Delayed imaging at 10 minutes  after contrast injection is performed, and there is contrast excretion into the dilated renal collecting systems bilaterally, but no excretion is seen in the ureters on the delayed imaging.  There are multiple locules of free intraperitoneal gas seen in the upper abdomen, a new finding. The superior most locules of gas are seen at the level of the liver, just beneath the right hemidiaphragm. Pneumoperitoneum is scattered anteriorly throughout the mid abdomen.  There are scattered areas of mild intra-abdominal ascites and or peritoneal stranding, most prominent in the right abdomen, for instance image number 48. Small volume of ascites is also seen in the mid abdomen anterior to the inferior vena cava. This intra-abdominal ascites is new/increasing.  A pelvic fluid and air collection has significantly each increased in size since the CT of 09/07/2014 and measures 10.3 cm maximum AP diameter 9.8 cm maximum transverse diameter. This fluid measures approximately 10 Hounsfield units and is centered anterior to the distal sigmoid colon. There is mild peritoneal enhancement at the level of this intraperitoneal fluid and air collection. There is new mucosal enhancement of small bowel loops in the deep pelvis, adjacent to this fluid collection.  Urinary bladder is  completely decompressed the urinary bladder contour is best visualized on the sagittal images, where some intraluminal gas is seen near the bladder dome. A Foley catheter is present and appears to be within the urinary bladder, best seen on the sagittal images.  There is new subcutaneous stranding and/or fluid beneath the rectus abdominis musculature, and contiguous with the expected anterior bladder wall. This suprapubic stranding is seen on images 80 through 84.  There is presacral stranding on the images 70 to through 84.  There is evidence of pelvic floor laxity. Rectal prolapse is noted. There is evidence a rectal wall thickening on image number [id number]. Patient status post hysterectomy.  No pelvic fracture is identified. Lumbar spine vertebral bodies are normal in height and alignment.  IMPRESSION:  .: IMPRESSION:  . 1. New bilateral moderate hydroureteronephrosis in the setting of an enlarging intraperitoneal air and fluid collection and increasing upper abdominal pneumoperitoneum. Additionally, there is progressive suprapubic stranding/fluid. In discussion with the patient's clinical scenario with Dr. Pat Patrick, findings are most likely to be related to urinary bladder injury with resulting intraperitoneal urinary ascites and bilateral urinary tract obstruction. Bowel injury as a cause of an enlarging intraperitoneal air and fluid collection and pneumoperitoneum must be considered, but is clinically felt to be less likely. 2. Mucosal enhancement of bowel loops in the lower pelvis could be due to secondary irritation from the fluid collection in the pelvis. A primary cause of bowel wall inflammation is a less likely consideration. 3. Apparent rectal wall thickening, of uncertain etiology, and new versus progressive compared to recent prior CT. Question if there is any clinical son or history of a rectal injury. 4. No acute pelvic fracture is identified in this patient status post recent fall. If there is clinical  concern for an occult fracture, pelvic MRI could be considered, if indicated. 5. Small right pleural effusion and significant bilateral lower lobe atelectasis.   Electronically Signed   By: Curlene Dolphin M.D.   On: 09/09/2014 16:27     Assessment:  74 yo F status post mechanical fall with abdominal ascites, free air and worsening clinical status was taken yesterday to the operating room by Dr. Pat Patrick for exploratory laparoscopy. She is found to have a small bowel injury but no evidence of bladder injury. On CT scan yesterday, she  did have progressive bilateral hydronephrosis with delayed excretion presumably related to extrinsic compression from the pelvic abscess. Her urine output today remains excellent and her overall creatinine is normal.  Plan: 1) Bilateral hydroureteronephrosis- presumably related to extrinsic compression. Expected improvement with surgical decompression. Recommend follow-up renal ultrasound tomorrow to assess for resolution.  2) Urinary retention- difficulty voiding with catheter replacement. Recommend Foley removal per primary team once her pain is controlled and she is ambulating.    LOS: 3 days    Hollice Espy 09/10/2014

## 2014-09-10 NOTE — Progress Notes (Signed)
1 Day Post-Op   Subjective:  She is away complaining of moderate abdominal discomfort with no nausea or vomiting. She did not take any pain medicine overnight and has been moderately tachycardic all morning. I suspect her tachycardia is due to her poor pain control. She is appropriate and oriented with some significant change from last evening. No unsteady changes are noted and her subjective evaluation  Vital signs in last 24 hours: Temp:  [98 F (36.7 C)-99.3 F (37.4 C)] 99.3 F (37.4 C) (06/01 0658) Pulse Rate:  [73-136] 128 (06/01 0658) Resp:  [16-26] 20 (06/01 0658) BP: (101-149)/(49-81) 149/64 mmHg (06/01 0658) SpO2:  [88 %-97 %] 95 % (06/01 0658) Weight:  [68.72 kg (151 lb 8 oz)] 68.72 kg (151 lb 8 oz) (05/31 2054) Last BM Date: 09/06/14  Intake/Output from previous day: 05/31 0701 - 06/01 0700 In: 1966.7 [P.O.:120; I.V.:1846.7] Out: 1790 [Urine:1525; Blood:25]  GI: Her abdomen is moderately distended  Lab Results:  CBC  Recent Labs  09/09/14 1048 09/10/14 0512  WBC 13.4* 13.1*  HGB 13.3 12.1  HCT 40.5 36.3  PLT 237 211   CMP     Component Value Date/Time   NA 132* 09/10/2014 0512   K 4.0 09/10/2014 0512   CL 104 09/10/2014 0512   CO2 24 09/10/2014 0512   GLUCOSE 152* 09/10/2014 0512   BUN 12 09/10/2014 0512   CREATININE 0.54 09/10/2014 0512   CALCIUM 7.7* 09/10/2014 0512   PROT 5.2* 09/10/2014 0512   ALBUMIN 2.3* 09/10/2014 0512   AST 14* 09/10/2014 0512   ALT 11* 09/10/2014 0512   ALKPHOS 44 09/10/2014 0512   BILITOT 2.1* 09/10/2014 0512   GFRNONAA >60 09/10/2014 0512   GFRAA >60 09/10/2014 0512   PT/INR  Recent Labs  09/08/14 0042  LABPROT 13.5  INR 1.01    Studies/Results: Dg Chest 1 View  09/09/2014   CLINICAL DATA:  Bedside central venous catheter placement.  EXAM: CHEST  1 VIEW  COMPARISON:  CT chest 12/10/2013, 04/09/2013. Two-view chest x-ray 01/15/2010.  FINDINGS: Portable AP erect examination. Markedly suboptimal inspiration  accounts for atelectasis at the lung bases. Right jugular central venous catheter tip projects at or near the cavoatrial junction. No evidence of pneumothorax or mediastinal hematoma. Heart size normal for technique and degree of inspiration. Lungs otherwise clear.  IMPRESSION: 1. Right jugular central venous catheter tip projects at or near the cavoatrial junction. No acute complicating features. 2. Markedly suboptimal inspiration accounts for bibasilar atelectasis. No acute cardiopulmonary disease otherwise.   Electronically Signed   By: Evangeline Dakin M.D.   On: 09/09/2014 20:00   Ct Abdomen Pelvis W Contrast  09/09/2014   CLINICAL DATA:  Patient unable to urinate for 24 hours with abdominal pain, nausea vomiting. The patient rib recently felt on a when rock and head pelvic pain, prompting her to seek treatment on 09/07/2014. On that day, they were CT findings in the pelvis suggestive of a possible urinary bladder injury. A pelvic CT and a cystoscopy have since been performed, showing no evidence of urinary bladder injury. The patient's nurse and Dr. Pat Patrick report to me that the patient has had a decrease in urine output.  EXAM: CT ABDOMEN AND PELVIS WITH CONTRAST  TECHNIQUE: Multidetector CT imaging of the abdomen and pelvis was performed using the standard protocol following bolus administration of intravenous contrast.  CONTRAST:  123mL OMNIPAQUE IOHEXOL 300 MG/ML  SOLN  COMPARISON:  CT pelvis without contrast 09/07/2014, CT abdomen pelvis 09/07/2014  FINDINGS:  Lung bases: Small simple appearing left pleural effusion posteriorly. There significant atelectasis in the medial lower lobes bilaterally.  Normal adrenal glands, spleen, and pancreas. The gallbladder contains some high density within its lumen that is new compared to prior CT, most consistent with excretion of previous intravenous contrast into the bile. There is a stable 10 mm hypodensity in the right hepatic lobe, likely reflecting a benign cyst.  No intrahepatic biliary ductal dilatation.  There is bilateral moderate hydroureteronephrosis; this is new since the CT of 09/07/2014. Both ureters are dilated to at least the level of the iliac crests, and become difficult to follow lower in the pelvis. Delayed imaging at 10 minutes after contrast injection is performed, and there is contrast excretion into the dilated renal collecting systems bilaterally, but no excretion is seen in the ureters on the delayed imaging.  There are multiple locules of free intraperitoneal gas seen in the upper abdomen, a new finding. The superior most locules of gas are seen at the level of the liver, just beneath the right hemidiaphragm. Pneumoperitoneum is scattered anteriorly throughout the mid abdomen.  There are scattered areas of mild intra-abdominal ascites and or peritoneal stranding, most prominent in the right abdomen, for instance image number 48. Small volume of ascites is also seen in the mid abdomen anterior to the inferior vena cava. This intra-abdominal ascites is new/increasing.  A pelvic fluid and air collection has significantly each increased in size since the CT of 09/07/2014 and measures 10.3 cm maximum AP diameter 9.8 cm maximum transverse diameter. This fluid measures approximately 10 Hounsfield units and is centered anterior to the distal sigmoid colon. There is mild peritoneal enhancement at the level of this intraperitoneal fluid and air collection. There is new mucosal enhancement of small bowel loops in the deep pelvis, adjacent to this fluid collection.  Urinary bladder is completely decompressed the urinary bladder contour is best visualized on the sagittal images, where some intraluminal gas is seen near the bladder dome. A Foley catheter is present and appears to be within the urinary bladder, best seen on the sagittal images.  There is new subcutaneous stranding and/or fluid beneath the rectus abdominis musculature, and contiguous with the expected  anterior bladder wall. This suprapubic stranding is seen on images 80 through 84.  There is presacral stranding on the images 70 to through 84.  There is evidence of pelvic floor laxity. Rectal prolapse is noted. There is evidence a rectal wall thickening on image number [id number]. Patient status post hysterectomy.  No pelvic fracture is identified. Lumbar spine vertebral bodies are normal in height and alignment.  IMPRESSION:  .: IMPRESSION:  . 1. New bilateral moderate hydroureteronephrosis in the setting of an enlarging intraperitoneal air and fluid collection and increasing upper abdominal pneumoperitoneum. Additionally, there is progressive suprapubic stranding/fluid. In discussion with the patient's clinical scenario with Dr. Pat Patrick, findings are most likely to be related to urinary bladder injury with resulting intraperitoneal urinary ascites and bilateral urinary tract obstruction. Bowel injury as a cause of an enlarging intraperitoneal air and fluid collection and pneumoperitoneum must be considered, but is clinically felt to be less likely. 2. Mucosal enhancement of bowel loops in the lower pelvis could be due to secondary irritation from the fluid collection in the pelvis. A primary cause of bowel wall inflammation is a less likely consideration. 3. Apparent rectal wall thickening, of uncertain etiology, and new versus progressive compared to recent prior CT. Question if there is any clinical son or history of  a rectal injury. 4. No acute pelvic fracture is identified in this patient status post recent fall. If there is clinical concern for an occult fracture, pelvic MRI could be considered, if indicated. 5. Small right pleural effusion and significant bilateral lower lobe atelectasis.   Electronically Signed   By: Curlene Dolphin M.D.   On: 09/09/2014 16:27    Assessment/Plan: Overall she is markedly improved from preoperatively. We will change her dressings tomorrow. We will work aggressively on pain control  today starting with fentanyl patch and adding tramadol. I would like her to consider IV Dilaudid or fentanyl with Benadryl back up for her allergies. At the present time she will not consider taking that medication. I discussed situation with her family in detail. White blood cell count is slightly elevated and she is on 2 antibiotics present time. We will restart her home medications. She remains critically ill but is improved.

## 2014-09-10 NOTE — Progress Notes (Signed)
Discontinue metropolol order IV push per Dr. Sabra Heck

## 2014-09-10 NOTE — Progress Notes (Signed)
Hannah [name] is a 74 y.o. female   SUBJECTIVE:  Patient admitted with severe abdominal pain, went to the OR yesterday, finding perforated small bowel with pelvic abscess. Patient notes no significant pain this morning. Sinus tachycardia noted, no dyspnea, no chest pain.  ______________________________________________________________________  ROS: Review of systems is unremarkable for any active cardiac,respiratory, GI, GU, hematologic, neurologic or psychiatric systems, 10 systems reviewed.  . cefTRIAXone (ROCEPHIN)  IV  1 g Intravenous Q24H  . famotidine (PEPCID) IV  20 mg Intravenous Q12H  . metoprolol  5 mg Intravenous 3 times per day  . metronidazole  500 mg Intravenous Q8H  . phenazopyridine  100 mg Oral TID WC   acetaminophen **OR** acetaminophen, diphenhydrAMINE, fentaNYL (SUBLIMAZE) injection, HYDROmorphone (DILAUDID) injection, ondansetron (ZOFRAN) IV, traMADol   Past Medical History  Diagnosis Date  . Hypertension   . Hyperlipidemia   . Mitral valve prolapse     Past Surgical History  Procedure Laterality Date  . Back surgery    . Abdominal hysterectomy      PHYSICAL EXAM:  BP 149/64 mmHg  Pulse 128  Temp(Src) 99.3 F (37.4 C) (Oral)  Resp 20  Ht 5\' 6"  (1.676 m)  Wt 68.72 kg (151 lb 8 oz)  BMI 24.46 kg/m2  SpO2 95%  Wt Readings from Last 3 Encounters:  09/09/14 68.72 kg (151 lb 8 oz)           BP Readings from Last 3 Encounters:  09/10/14 149/64    Constitutional: NAD Neck: supple, no thyromegaly Respiratory: CTA, no rales or wheezes Cardiovascular: RRR, no murmur, no gallop, tachycardia Abdomen: Moderately distended, no bowel sounds, mild diffuse tenderness Extremities: no edema Neuro: alert and oriented, no focal motor or sensory deficits  ASSESSMENT/PLAN:  Labs and imaging studies were reviewed  Sinus tachycardia-IV metoprolol as she currently has an ileus Hypertension-IV metoprolol Perforated small bowel-postop, surgical care,  bilirubin 2.1

## 2014-09-10 NOTE — Progress Notes (Addendum)
Attempted to notify Dr. Pat Patrick about pt experiencing chest pain. Called multiple times and was not able to reach him. VSS.Called CCU for Tele reading, pt sinus tachy with occasional PVCs. Pt reported that chest pain resolved shortly. Pt had EKG done last night. Will continue to monitor.

## 2014-09-11 ENCOUNTER — Encounter: Payer: Self-pay | Admitting: Surgery

## 2014-09-11 LAB — CBC WITH DIFFERENTIAL/PLATELET
BASOS PCT: 0 %
Basophils Absolute: 0 10*3/uL (ref 0–0.1)
Eosinophils Absolute: 0 10*3/uL (ref 0–0.7)
Eosinophils Relative: 0 %
HCT: 30 % — ABNORMAL LOW (ref 35.0–47.0)
Hemoglobin: 9.8 g/dL — ABNORMAL LOW (ref 12.0–16.0)
LYMPHS ABS: 0.7 10*3/uL — AB (ref 1.0–3.6)
LYMPHS PCT: 5 %
MCH: 29.1 pg (ref 26.0–34.0)
MCHC: 32.8 g/dL (ref 32.0–36.0)
MCV: 88.7 fL (ref 80.0–100.0)
MONOS PCT: 8 %
Monocytes Absolute: 1.2 10*3/uL — ABNORMAL HIGH (ref 0.2–0.9)
NEUTROS ABS: 13.3 10*3/uL — AB (ref 1.4–6.5)
Neutrophils Relative %: 87 %
Platelets: 236 10*3/uL (ref 150–440)
RBC: 3.38 MIL/uL — AB (ref 3.80–5.20)
RDW: 14.5 % (ref 11.5–14.5)
WBC: 15.3 10*3/uL — AB (ref 3.6–11.0)

## 2014-09-11 LAB — GLUCOSE, CAPILLARY
GLUCOSE-CAPILLARY: 146 mg/dL — AB (ref 65–99)
GLUCOSE-CAPILLARY: 131 mg/dL — AB (ref 65–99)
Glucose-Capillary: 137 mg/dL — ABNORMAL HIGH (ref 65–99)
Glucose-Capillary: 146 mg/dL — ABNORMAL HIGH (ref 65–99)

## 2014-09-11 LAB — COMPREHENSIVE METABOLIC PANEL
ALBUMIN: 2 g/dL — AB (ref 3.5–5.0)
ALT: 17 U/L (ref 14–54)
ANION GAP: 4 — AB (ref 5–15)
AST: 20 U/L (ref 15–41)
Alkaline Phosphatase: 49 U/L (ref 38–126)
BUN: 13 mg/dL (ref 6–20)
CALCIUM: 7.5 mg/dL — AB (ref 8.9–10.3)
CO2: 26 mmol/L (ref 22–32)
Chloride: 103 mmol/L (ref 101–111)
Creatinine, Ser: 0.54 mg/dL (ref 0.44–1.00)
Glucose, Bld: 167 mg/dL — ABNORMAL HIGH (ref 65–99)
Potassium: 3.8 mmol/L (ref 3.5–5.1)
Sodium: 133 mmol/L — ABNORMAL LOW (ref 135–145)
TOTAL PROTEIN: 5.1 g/dL — AB (ref 6.5–8.1)
Total Bilirubin: 1.1 mg/dL (ref 0.3–1.2)

## 2014-09-11 LAB — URINE CULTURE: CULTURE: NO GROWTH

## 2014-09-11 MED ORDER — FENTANYL 25 MCG/HR TD PT72: 25.0000 ug | MEDICATED_PATCH | TRANSDERMAL | Status: DC

## 2014-09-11 MED ORDER — METOPROLOL TARTRATE 25 MG PO TABS
25.0000 mg | ORAL_TABLET | Freq: Two times a day (BID) | ORAL | Status: DC
  Administered 2014-09-11 – 2014-09-19 (×17): 25 mg via ORAL
  Filled 2014-09-11 (×17): qty 1

## 2014-09-11 MED ORDER — INSULIN ASPART 100 UNIT/ML ~~LOC~~ SOLN
0.0000 [IU] | Freq: Three times a day (TID) | SUBCUTANEOUS | Status: DC
  Administered 2014-09-11 – 2014-09-17 (×13): 1 [IU] via SUBCUTANEOUS
  Administered 2014-09-18: 2 [IU] via SUBCUTANEOUS
  Administered 2014-09-19: 1 [IU] via SUBCUTANEOUS
  Filled 2014-09-11 (×14): qty 1
  Filled 2014-09-11: qty 2
  Filled 2014-09-11: qty 1

## 2014-09-11 NOTE — Progress Notes (Signed)
Patient ID: Hannah [name], female   DOB: [birth year], 74 y.o.   MRN: [id number] Hannah [name] is a 74 y.o. female   SUBJECTIVE:  Patient admitted with severe abdominal pain, went to the OR 5/31, finding perforated small bowel with pelvic abscess. Patient's pain is better. Feels better. Mild hypotension earlier this morning treated with IV fluids. ______________________________________________________________________  ROS: Review of systems is unremarkable for any active cardiac,respiratory, GI, GU, hematologic, neurologic or psychiatric systems, 10 systems reviewed.  . cefTRIAXone (ROCEPHIN)  IV  1 g Intravenous Q24H  . famotidine (PEPCID) IV  20 mg Intravenous Q12H  . fentaNYL  50 mcg Transdermal Q72H  . metoprolol tartrate  25 mg Oral BID  . metronidazole  500 mg Intravenous Q8H  . phenazopyridine  100 mg Oral TID WC  . traMADol  100 mg Oral 4 times per day   acetaminophen **OR** acetaminophen, diphenhydrAMINE, HYDROmorphone (DILAUDID) injection, ondansetron (ZOFRAN) IV, traMADol   Past Medical History  Diagnosis Date  . Hypertension   . Hyperlipidemia   . Mitral valve prolapse     Past Surgical History  Procedure Laterality Date  . Back surgery    . Abdominal hysterectomy    . Laparotomy N/A 09/09/2014    Procedure: EXPLORATORY LAPAROTOMY, small bowel resection;  Surgeon: Dia Crawford III, MD;  Location: [facility];  Service: General;  Laterality: N/A;  . Bowel resection  09/09/2014    Procedure: , bladder instilation;  Surgeon: Dia Crawford III, MD;  Location: [facility];  Service: General;;  . Cardiac catheterization Right 09/09/2014    Procedure: CENTRAL LINE INSERTION;  Surgeon: Dia Crawford III, MD;  Location: [facility];  Service: General;  Laterality: Right;    PHYSICAL EXAM:  BP 98/47 mmHg  Pulse 70  Temp(Src) 98.1 F (36.7 C) (Oral)  Resp 16  Ht 5\' 6"  (1.676 m)  Wt 68.72 kg (151 lb 8 oz)  BMI 24.46 kg/m2  SpO2 90%  Wt Readings from Last 3 Encounters:  09/09/14  68.72 kg (151 lb 8 oz)           BP Readings from Last 3 Encounters:  09/11/14 98/47    Constitutional: NAD Neck: supple, no thyromegaly Respiratory: CTA, no rales or wheezes Cardiovascular: RRR, no murmur, no gallop, tachycardia Abdomen: Moderately distended, slight bowel sounds, mild diffuse tenderness Extremities: no edema Neuro: alert and oriented, no focal motor or sensory deficits  ASSESSMENT/PLAN:  Labs and imaging studies were reviewed  Sinus tachycardia-lower dose metoprolol Hypotension-resolved post IV fluids Perforated small bowel-postop, surgical care, bilirubin better 1.1, hemoglobin 9.8

## 2014-09-11 NOTE — Progress Notes (Signed)
2 Days Post-Op   Subjective: She is a bit lethargic and sedated. She denies any nausea or vomiting. Her pain is under pretty good control. She's not been out of bed as yet.  Vital signs in last 24 hours: Temp:  [97.5 F (36.4 C)-99 F (37.2 C)] 98 F (36.7 C) (06/02 1537) Pulse Rate:  [65-113] 77 (06/02 1537) Resp:  [16-20] 18 (06/02 1537) BP: (98-137)/(47-85) 130/53 mmHg (06/02 1537) SpO2:  [90 %-98 %] 91 % (06/02 1537) Last BM Date: 09/07/14  Intake/Output from previous day: 06/01 0701 - 06/02 0700 In: 1712 [I.V.:1612; IV Piggyback:100] Out: 2550 [Urine:2550]  GI: Her abdomen is soft with active bowel sounds but significant incisional tenderness. She does have less distention.  Lab Results:  CBC  Recent Labs  09/10/14 0512 09/11/14 0511  WBC 13.1* 15.3*  HGB 12.1 9.8*  HCT 36.3 30.0*  PLT 211 236   CMP     Component Value Date/Time   NA 133* 09/11/2014 0511   K 3.8 09/11/2014 0511   CL 103 09/11/2014 0511   CO2 26 09/11/2014 0511   GLUCOSE 167* 09/11/2014 0511   BUN 13 09/11/2014 0511   CREATININE 0.54 09/11/2014 0511   CALCIUM 7.5* 09/11/2014 0511   PROT 5.1* 09/11/2014 0511   ALBUMIN 2.0* 09/11/2014 0511   AST 20 09/11/2014 0511   ALT 17 09/11/2014 0511   ALKPHOS 49 09/11/2014 0511   BILITOT 1.1 09/11/2014 0511   GFRNONAA >60 09/11/2014 0511   GFRAA >60 09/11/2014 0511   PT/INR No results for input(s): LABPROT, INR in the last 72 hours.  Studies/Results: Dg Chest 1 View  09/09/2014   CLINICAL DATA:  Bedside central venous catheter placement.  EXAM: CHEST  1 VIEW  COMPARISON:  CT chest 12/10/2013, 04/09/2013. Two-view chest x-ray 01/15/2010.  FINDINGS: Portable AP erect examination. Markedly suboptimal inspiration accounts for atelectasis at the lung bases. Right jugular central venous catheter tip projects at or near the cavoatrial junction. No evidence of pneumothorax or mediastinal hematoma. Heart size normal for technique and degree of inspiration.  Lungs otherwise clear.  IMPRESSION: 1. Right jugular central venous catheter tip projects at or near the cavoatrial junction. No acute complicating features. 2. Markedly suboptimal inspiration accounts for bibasilar atelectasis. No acute cardiopulmonary disease otherwise.   Electronically Signed   By: Evangeline Dakin M.D.   On: 09/09/2014 20:00    Assessment/Plan: I think her confusion is likely related to her overmedication with fentanyl. I will back off on her fentanyl dosage. However, she is reluctant to take any IV medication so I'm going to continue a lower level of fentanyl at this point. Start her on some clear liquids and see how she does tolerating a diet. We'll try to get her out of bed tonight to sit in a chair. I discussed this plan with the family in detail.

## 2014-09-12 LAB — COMPREHENSIVE METABOLIC PANEL
ALT: 17 U/L (ref 14–54)
AST: 16 U/L (ref 15–41)
Albumin: 2.2 g/dL — ABNORMAL LOW (ref 3.5–5.0)
Alkaline Phosphatase: 58 U/L (ref 38–126)
Anion gap: 4 — ABNORMAL LOW (ref 5–15)
BUN: 10 mg/dL (ref 6–20)
CALCIUM: 7.8 mg/dL — AB (ref 8.9–10.3)
CHLORIDE: 104 mmol/L (ref 101–111)
CO2: 28 mmol/L (ref 22–32)
Creatinine, Ser: 0.43 mg/dL — ABNORMAL LOW (ref 0.44–1.00)
GFR calc Af Amer: >60 mL/min (ref >60)
GFR calc non Af Amer: >60 mL/min (ref >60)
Glucose, Bld: 142 mg/dL — ABNORMAL HIGH (ref 65–99)
POTASSIUM: 3.1 mmol/L — AB (ref 3.5–5.1)
SODIUM: 136 mmol/L (ref 135–145)
TOTAL PROTEIN: 5.3 g/dL — AB (ref 6.5–8.1)
Total Bilirubin: 0.8 mg/dL (ref 0.3–1.2)

## 2014-09-12 LAB — CBC WITH DIFFERENTIAL/PLATELET
BASOS PCT: 0 %
Basophils Absolute: 0 10*3/uL (ref 0–0.1)
EOS PCT: 2 %
Eosinophils Absolute: 0.3 10*3/uL (ref 0–0.7)
HEMATOCRIT: 31.5 % — AB (ref 35.0–47.0)
Hemoglobin: 10.3 g/dL — ABNORMAL LOW (ref 12.0–16.0)
Lymphocytes Relative: 10 %
Lymphs Abs: 1.2 10*3/uL (ref 1.0–3.6)
MCH: 29 pg (ref 26.0–34.0)
MCHC: 32.8 g/dL (ref 32.0–36.0)
MCV: 88.6 fL (ref 80.0–100.0)
MONO ABS: 1.2 10*3/uL — AB (ref 0.2–0.9)
Monocytes Relative: 10 %
Neutro Abs: 9.3 10*3/uL — ABNORMAL HIGH (ref 1.4–6.5)
Neutrophils Relative %: 78 %
PLATELETS: 287 10*3/uL (ref 150–440)
RBC: 3.55 MIL/uL — ABNORMAL LOW (ref 3.80–5.20)
RDW: 14.5 % (ref 11.5–14.5)
WBC: 12 10*3/uL — AB (ref 3.6–11.0)

## 2014-09-12 LAB — GLUCOSE, CAPILLARY
Glucose-Capillary: 143 mg/dL — ABNORMAL HIGH (ref 65–99)
Glucose-Capillary: 124 mg/dL — ABNORMAL HIGH (ref 65–99)
Glucose-Capillary: 146 mg/dL — ABNORMAL HIGH (ref 65–99)
Glucose-Capillary: 163 mg/dL — ABNORMAL HIGH (ref 65–99)

## 2014-09-12 LAB — SURGICAL PATHOLOGY

## 2014-09-12 MED ORDER — CLINDAMYCIN PHOSPHATE 600 MG/50ML IV SOLN
600.0000 mg | Freq: Three times a day (TID) | INTRAVENOUS | Status: DC
  Administered 2014-09-12 – 2014-09-17 (×14): 600 mg via INTRAVENOUS
  Filled 2014-09-12 (×19): qty 50

## 2014-09-12 MED ORDER — DIPHENHYDRAMINE HCL 50 MG/ML IJ SOLN: 25.0000 mg | Freq: Three times a day (TID) | INTRAMUSCULAR | Status: DC | PRN

## 2014-09-12 MED ORDER — FENTANYL 25 MCG/HR TD PT72
25.0000 ug | MEDICATED_PATCH | TRANSDERMAL | Status: DC
  Administered 2014-09-12: 25 ug via TRANSDERMAL
  Filled 2014-09-12: qty 1

## 2014-09-12 MED ORDER — ACETAMINOPHEN 10 MG/ML IV SOLN
1000.0000 mg | Freq: Once | INTRAVENOUS | Status: AC
  Administered 2014-09-12: 1000 mg via INTRAVENOUS
  Filled 2014-09-12: qty 100

## 2014-09-12 MED ORDER — PHENOL 1.4 % MT LIQD
1.0000 | OROMUCOSAL | Status: DC | PRN
  Filled 2014-09-12: qty 177

## 2014-09-12 MED ORDER — ACETAMINOPHEN 10 MG/ML IV SOLN
1000.0000 mg | Freq: Four times a day (QID) | INTRAVENOUS | Status: AC
  Administered 2014-09-12 – 2014-09-13 (×2): 1000 mg via INTRAVENOUS
  Filled 2014-09-12 (×3): qty 100

## 2014-09-12 MED ORDER — POTASSIUM CHLORIDE CRYS ER 10 MEQ PO TBCR
10.0000 meq | EXTENDED_RELEASE_TABLET | Freq: Two times a day (BID) | ORAL | Status: DC
  Administered 2014-09-12: 10 meq via ORAL
  Filled 2014-09-12: qty 1

## 2014-09-12 MED ORDER — KCL IN DEXTROSE-NACL 20-5-0.45 MEQ/L-%-% IV SOLN
INTRAVENOUS | Status: DC
  Administered 2014-09-12 – 2014-09-13 (×3): via INTRAVENOUS
  Filled 2014-09-12 (×6): qty 1000

## 2014-09-12 NOTE — Progress Notes (Signed)
3 Days Post-Op   Subjective:  She is in considerable pain with mild nausea. She did not get any pain medicine over the course of the evening other than tramadol. The patient family are elected to try any other medication but she did well with fentanyl. I suspect dose was too high as she was significantly lethargic. We had attempted to lower the dose but the patient had refused the medication.  Vital signs in last 24 hours: Temp:  [97.8 F (36.6 C)-98.1 F (36.7 C)] 98.1 F (36.7 C) (06/03 0743) Pulse Rate:  [71-85] 84 (06/03 0743) Resp:  [18-20] 20 (06/03 0743) BP: (109-145)/(47-85) 145/53 mmHg (06/03 0743) SpO2:  [91 %-96 %] 96 % (06/03 0743) Last BM Date: 09/07/14  Intake/Output from previous day: 06/02 0701 - 06/03 0700 In: 3135 [P.O.:60; I.V.:2788; IV Piggyback:287] Out: 1850 [Urine:1850]  GI: She is mildly distended tympanitic. The wound looks good with minimal drainage. The Penrose is are in place. Dressings were reapplied.  Lab Results:  CBC  Recent Labs  09/11/14 0511 09/12/14 0545  WBC 15.3* 12.0*  HGB 9.8* 10.3*  HCT 30.0* 31.5*  PLT 236 287   CMP     Component Value Date/Time   NA 136 09/12/2014 0545   K 3.1* 09/12/2014 0545   CL 104 09/12/2014 0545   CO2 28 09/12/2014 0545   GLUCOSE 142* 09/12/2014 0545   BUN 10 09/12/2014 0545   CREATININE 0.43* 09/12/2014 0545   CALCIUM 7.8* 09/12/2014 0545   PROT 5.3* 09/12/2014 0545   ALBUMIN 2.2* 09/12/2014 0545   AST 16 09/12/2014 0545   ALT 17 09/12/2014 0545   ALKPHOS 58 09/12/2014 0545   BILITOT 0.8 09/12/2014 0545   GFRNONAA >60 09/12/2014 0545   GFRAA >60 09/12/2014 0545   PT/INR No results for input(s): LABPROT, INR in the last 72 hours.  Studies/Results: No results found.  Assessment/Plan: She is more uncomfortable today. She is mildly distended and I suspect has no ileus. Cultures are still pending. We'll continue the antibiotic therapy IV rehydration recheck her labs and see if we can get her  pain under control.

## 2014-09-12 NOTE — Progress Notes (Signed)
Patient ID: Hannah [name], female   DOB: October 13, 1940, 74 y.o.   MRN: [id number] Patient ID: Hannah [name], female   DOB: [birth year], 74 y.o.   MRN: [id number] Hannah [name] is a 74 y.o. female   SUBJECTIVE:  Patient admitted with severe abdominal pain, went to the OR 5/31, finding perforated small bowel with pelvic abscess. Tolerating liquid diet, minimal pain ______________________________________________________________________  ROS: Review of systems is unremarkable for any active cardiac,respiratory, GI, GU, hematologic, neurologic or psychiatric systems, 10 systems reviewed.  . cefTRIAXone (ROCEPHIN)  IV  1 g Intravenous Q24H  . famotidine (PEPCID) IV  20 mg Intravenous Q12H  . [START ON 09/13/2014] fentaNYL  25 mcg Transdermal Q72H  . insulin aspart  0-9 Units Subcutaneous TID WC  . metoprolol tartrate  25 mg Oral BID  . metronidazole  500 mg Intravenous Q8H  . phenazopyridine  100 mg Oral TID WC  . potassium chloride  10 mEq Oral BID  . traMADol  100 mg Oral 4 times per day   acetaminophen **OR** acetaminophen, diphenhydrAMINE, HYDROmorphone (DILAUDID) injection, ondansetron (ZOFRAN) IV, traMADol   Past Medical History  Diagnosis Date  . Hypertension   . Hyperlipidemia   . Mitral valve prolapse     Past Surgical History  Procedure Laterality Date  . Back surgery    . Abdominal hysterectomy    . Laparotomy N/A 09/09/2014    Procedure: EXPLORATORY LAPAROTOMY, small bowel resection;  Surgeon: Dia Crawford III, MD;  Location: [facility];  Service: General;  Laterality: N/A;  . Bowel resection  09/09/2014    Procedure: , bladder instilation;  Surgeon: Dia Crawford III, MD;  Location: [facility];  Service: General;;  . Cardiac catheterization Right 09/09/2014    Procedure: CENTRAL LINE INSERTION;  Surgeon: Dia Crawford III, MD;  Location: [facility];  Service: General;  Laterality: Right;    PHYSICAL EXAM:  BP 109/47 mmHg  Pulse 85  Temp(Src) 98 F (36.7 C) (Oral)  Resp 18   Ht 5\' 6"  (1.676 m)  Wt 68.72 kg (151 lb 8 oz)  BMI 24.46 kg/m2  SpO2 92%  Wt Readings from Last 3 Encounters:  09/09/14 68.72 kg (151 lb 8 oz)           BP Readings from Last 3 Encounters:  09/11/14 109/47    Constitutional: NAD Neck: supple, no thyromegaly Respiratory: CTA, no rales or wheezes Cardiovascular: RRR, no murmur, no gallop, tachycardia Abdomen: Mildly distended, bowel sounds present, mild diffuse tenderness Extremities: no edema Neuro: alert and oriented, no focal motor or sensory deficits  ASSESSMENT/PLAN:  Labs and imaging studies were reviewed  Sinus tachycardia-resolved with usual dose metoprolol Hypotension-resolved post IV fluids Perforated small bowel-postop, surgical care, bilirubin better 1.1, hemoglobin 10 Using incentive spirometry

## 2014-09-12 NOTE — Progress Notes (Signed)
Initial Nutrition Assessment  DOCUMENTATION CODES:     INTERVENTION: Medical Food Supplement Therapy: will recommend Boost Breeze when diet order able to be advanced to CL or Ensure if advanced to FL (pt likes vanilla or strawberry)   NUTRITION DIAGNOSIS:  Inadequate oral intake related to inability to eat as evidenced by NPO status.  GOAL:   (Goal for diet advancement and tolerance as medically able)  MONITOR:   (Energy Intake, Electrolyte and Renal Profile, Digestive System)  REASON FOR ASSESSMENT:   (RD Screen, Length of Stay)    ASSESSMENT:  Pt admitted s/p fall with a perforated small bowel, POD3 small bowel resection. PMHx: Past Medical History  Diagnosis Date  . Hypertension   . Hyperlipidemia   . Mitral valve prolapse     PO Intake: pt taking sips of liquids this am including apple juice and some tea yesterday. NPO/CL since admission. Nutrition PTA: Pt reports appetite was good PTA eating 3 meals a day.  Medications: Novolog, Flagyl, D5 0.45%NS with KCl at 129mL/hr (providing 408kcals in 24 hours) Labs: Electrolyte and Renal Profile:  Recent Labs Lab 09/10/14 0512 09/11/14 0511 09/12/14 0545  BUN 12 13 10   CREATININE 0.54 0.54 0.43*  NA 132* 133* 136  K 4.0 3.8 3.1*   Glucose Profile:  Recent Labs  09/11/14 2113 09/12/14 0741 09/12/14 1209  GLUCAP 146* 143* 124*   Protein Profile:  Recent Labs Lab 09/10/14 0512 09/11/14 0511 09/12/14 0545  ALBUMIN 2.3* 2.0* 2.2*   Pt reports weight of 135-136lbs UBW.  Filed Weights   09/07/14 1217 09/09/14 2054  Weight: 135 lb (61.236 kg) 151 lb 8 oz (68.72 kg)    Height:  Ht Readings from Last 1 Encounters:  09/07/14 5\' 6"  (1.676 m)    Weight:  Wt Readings from Last 1 Encounters:  09/09/14 151 lb 8 oz (68.72 kg)    Ideal Body Weight:     Wt Readings from Last 10 Encounters:  09/09/14 151 lb 8 oz (68.72 kg)    BMI:  Body mass index is 24.46 kg/(m^2).   Nutrition-Focused physical  exam completed. Findings are WDL for fat depletion, muscle depletion, and edema.   Estimated Nutritional Needs:  Kcal:  1589-1879kcals, BEE: 1204kcals, TEE: (IF 1.1-1.3)(AF 1.2)   Protein:  69-82g protein (1.0-1.2g/kg)  Fluid:  1718-2035mL of fluid (25-77mL/kg)  Diet Order:   NPO, per MD Pat Patrick ok to have sips of juice/tea from floor.  EDUCATION NEEDS:  Education needs no appropriate at this time   Intake/Output Summary (Last 24 hours) at 09/12/14 1321 Last data filed at 09/12/14 1210  Gross per 24 hour  Intake   2290 ml  Output   2450 ml  Net   -160 ml    Last BM:  5/29   MODERATE Care Level  Dwyane Luo, RD, LDN Pager [phone]

## 2014-09-13 LAB — BASIC METABOLIC PANEL
ANION GAP: 5 (ref 5–15)
BUN: <5 mg/dL — ABNORMAL LOW (ref 6–20)
CHLORIDE: 100 mmol/L — AB (ref 101–111)
CO2: 29 mmol/L (ref 22–32)
Calcium: 7.4 mg/dL — ABNORMAL LOW (ref 8.9–10.3)
Creatinine, Ser: 0.44 mg/dL (ref 0.44–1.00)
Glucose, Bld: 160 mg/dL — ABNORMAL HIGH (ref 65–99)
Potassium: 3.1 mmol/L — ABNORMAL LOW (ref 3.5–5.1)
SODIUM: 134 mmol/L — AB (ref 135–145)

## 2014-09-13 LAB — CBC WITH DIFFERENTIAL/PLATELET
BASOS ABS: 0 10*3/uL (ref 0–0.1)
Basophils Relative: 0 %
EOS PCT: 2 %
Eosinophils Absolute: 0.3 10*3/uL (ref 0–0.7)
HCT: 33.9 % — ABNORMAL LOW (ref 35.0–47.0)
Hemoglobin: 11.1 g/dL — ABNORMAL LOW (ref 12.0–16.0)
LYMPHS ABS: 0.9 10*3/uL — AB (ref 1.0–3.6)
LYMPHS PCT: 7 %
MCH: 28.7 pg (ref 26.0–34.0)
MCHC: 32.6 g/dL (ref 32.0–36.0)
MCV: 88.1 fL (ref 80.0–100.0)
MONOS PCT: 9 %
Monocytes Absolute: 1.2 10*3/uL — ABNORMAL HIGH (ref 0.2–0.9)
NEUTROS PCT: 82 %
Neutro Abs: 10.3 10*3/uL — ABNORMAL HIGH (ref 1.4–6.5)
Platelets: 313 10*3/uL (ref 150–440)
RBC: 3.84 MIL/uL (ref 3.80–5.20)
RDW: 14.3 % (ref 11.5–14.5)
WBC: 12.7 10*3/uL — AB (ref 3.6–11.0)

## 2014-09-13 LAB — GLUCOSE, CAPILLARY
GLUCOSE-CAPILLARY: 133 mg/dL — AB (ref 65–99)
Glucose-Capillary: 138 mg/dL — ABNORMAL HIGH (ref 65–99)
Glucose-Capillary: 128 mg/dL — ABNORMAL HIGH (ref 65–99)
Glucose-Capillary: 154 mg/dL — ABNORMAL HIGH (ref 65–99)

## 2014-09-13 LAB — BODY FLUID CULTURE

## 2014-09-13 MED ORDER — ACETAMINOPHEN 10 MG/ML IV SOLN
1000.0000 mg | Freq: Four times a day (QID) | INTRAVENOUS | Status: AC
  Administered 2014-09-13 – 2014-09-14 (×3): 1000 mg via INTRAVENOUS
  Filled 2014-09-13 (×3): qty 100

## 2014-09-13 MED ORDER — MENTHOL 3 MG MT LOZG
1.0000 | LOZENGE | OROMUCOSAL | Status: DC | PRN
  Administered 2014-09-14 – 2014-09-17 (×2): 3 mg via ORAL
  Filled 2014-09-13: qty 9

## 2014-09-13 MED ORDER — KCL IN DEXTROSE-NACL 40-5-0.45 MEQ/L-%-% IV SOLN
INTRAVENOUS | Status: DC
  Administered 2014-09-13 – 2014-09-17 (×9): via INTRAVENOUS
  Filled 2014-09-13 (×14): qty 1000

## 2014-09-13 NOTE — Progress Notes (Signed)
Patient ID: Hannah [name], female   DOB: [birth year], 74 y.o.   MRN: [id number] Patient ID: Hannah [name], female   DOB: [birth year], 74 y.o.   MRN: [id number] Patient ID: Hannah [name], female   DOB: [birth year], 74 y.o.   MRN: [id number] Hannah [name] is a 74 y.o. female   SUBJECTIVE:  Patient admitted with severe abdominal pain, went to the OR 5/31, finding perforated small bowel with pelvic abscess. Developed N&V last night. NG was placed. ______________________________________________________________________  ROS: Review of systems is unremarkable for any active cardiac,respiratory,  GU, hematologic, neurologic or psychiatric systems.  . cefTRIAXone (ROCEPHIN)  IV  1 g Intravenous Q24H  . clindamycin (CLEOCIN) IV  600 mg Intravenous 3 times per day  . famotidine (PEPCID) IV  20 mg Intravenous Q12H  . insulin aspart  0-9 Units Subcutaneous TID WC  . metoprolol tartrate  25 mg Oral BID  . phenazopyridine  100 mg Oral TID WC  . traMADol  100 mg Oral 4 times per day   diphenhydrAMINE, ondansetron (ZOFRAN) IV, phenol, traMADol   Past Medical History  Diagnosis Date  . Hypertension   . Hyperlipidemia   . Mitral valve prolapse     Past Surgical History  Procedure Laterality Date  . Back surgery    . Abdominal hysterectomy    . Laparotomy N/A 09/09/2014    Procedure: EXPLORATORY LAPAROTOMY, small bowel resection;  Surgeon: Dia Crawford III, MD;  Location: [facility];  Service: General;  Laterality: N/A;  . Bowel resection  09/09/2014    Procedure: , bladder instilation;  Surgeon: Dia Crawford III, MD;  Location: [facility];  Service: General;;  . Cardiac catheterization Right 09/09/2014    Procedure: CENTRAL LINE INSERTION;  Surgeon: Dia Crawford III, MD;  Location: [facility];  Service: General;  Laterality: Right;    PHYSICAL EXAM:  BP 154/62 mmHg  Pulse 106  Temp(Src) 99.2 F (37.3 C) (Oral)  Resp 16  Ht 5\' 6"  (1.676 m)  Wt 68.72 kg (151 lb 8 oz)  BMI 24.46 kg/m2   SpO2 96%  Wt Readings from Last 3 Encounters:  09/09/14 68.72 kg (151 lb 8 oz)           BP Readings from Last 3 Encounters:  09/13/14 154/62    Constitutional: NAD Neck: supple, no thyromegaly Respiratory: CTA, no rales or wheezes Cardiovascular: RRR, no murmur, no gallop, tachycardia Abdomen: Mildly distended, mild diffuse tenderness, NG  In place. Extremities: no edema Neuro: alert and oriented, no focal motor or sensory deficits  ASSESSMENT/PLAN:  Labs and imaging studies were reviewed  Sinus tachycardia-resolved with usual dose metoprolol Hypotension-resolved post IV fluids Perforated small bowel-postop, surgical care, discussed with Dr. Pat Patrick. Using incentive spirometry

## 2014-09-13 NOTE — Progress Notes (Signed)
Patient is A/O, verbal is minimal resulted to NG. Pain has been at a 2. Administered med via tube, clamped for 30 mins after medication administration. Administered insulin coverage, see E-Mar. Family at bedside. Staff will continue to monitor and meet needs.

## 2014-09-13 NOTE — Progress Notes (Signed)
4 Days Post-Op   Subjective:  She is feeling better this morning. There is no further nausea after her nasogastric tube was placed last night. She's not having any significant pain this morning. She feels some bowel gas but has not passed any gas as yet. Her laboratory values are improved but her potassium is down to 3.1. She has good urinary output. She is overall improved.  Vital signs in last 24 hours: Temp:  [98.4 F (36.9 C)-99.2 F (37.3 C)] 99.2 F (37.3 C) (06/04 0732) Pulse Rate:  [92-106] 106 (06/04 0732) Resp:  [16-20] 16 (06/04 0732) BP: (133-155)/(62-67) 154/62 mmHg (06/04 0732) SpO2:  [93 %-96 %] 96 % (06/04 0732) Last BM Date: 09/07/14  Intake/Output from previous day: 06/03 0701 - 06/04 0700 In: 3319 [I.V.:3319] Out: 2700 [Urine:2550]  GI: Her abdomen is soft with reasonable bowel sounds and incisional tenderness. There is no significant distention and minimal drainage on her dressing.  Lab Results:  CBC  Recent Labs  09/12/14 0545 09/13/14 0812  WBC 12.0* 12.7*  HGB 10.3* 11.1*  HCT 31.5* 33.9*  PLT 287 313   CMP     Component Value Date/Time   NA 134* 09/13/2014 0812   K 3.1* 09/13/2014 0812   CL 100* 09/13/2014 0812   CO2 29 09/13/2014 0812   GLUCOSE 160* 09/13/2014 0812   BUN <5* 09/13/2014 0812   CREATININE 0.44 09/13/2014 0812   CALCIUM 7.4* 09/13/2014 0812   PROT 5.3* 09/12/2014 0545   ALBUMIN 2.2* 09/12/2014 0545   AST 16 09/12/2014 0545   ALT 17 09/12/2014 0545   ALKPHOS 58 09/12/2014 0545   BILITOT 0.8 09/12/2014 0545   GFRNONAA >60 09/13/2014 0812   GFRAA >60 09/13/2014 0812   PT/INR No results for input(s): LABPROT, INR in the last 72 hours.  Studies/Results: No results found.  Assessment/Plan: She has improved since last evening. Her family is on board with the current plan. We will continue nasogastric suction another 24 hours. Hopefully we will see some bowel function shortly. As she is improves I will not repeat her x-rays  today. We will increase the potassium in her IV fluid. Plan with the patient and her son.

## 2014-09-14 LAB — GLUCOSE, CAPILLARY
GLUCOSE-CAPILLARY: 130 mg/dL — AB (ref 65–99)
GLUCOSE-CAPILLARY: 126 mg/dL — AB (ref 65–99)
Glucose-Capillary: 138 mg/dL — ABNORMAL HIGH (ref 65–99)
Glucose-Capillary: 124 mg/dL — ABNORMAL HIGH (ref 65–99)

## 2014-09-14 LAB — CBC
HCT: 33.1 % — ABNORMAL LOW (ref 35.0–47.0)
Hemoglobin: 11.1 g/dL — ABNORMAL LOW (ref 12.0–16.0)
MCH: 29.5 pg (ref 26.0–34.0)
MCHC: 33.4 g/dL (ref 32.0–36.0)
MCV: 88.2 fL (ref 80.0–100.0)
PLATELETS: 341 10*3/uL (ref 150–440)
RBC: 3.75 MIL/uL — ABNORMAL LOW (ref 3.80–5.20)
RDW: 14.5 % (ref 11.5–14.5)
WBC: 12.2 10*3/uL — ABNORMAL HIGH (ref 3.6–11.0)

## 2014-09-14 LAB — BASIC METABOLIC PANEL
Anion gap: 4 — ABNORMAL LOW (ref 5–15)
BUN: <5 mg/dL — ABNORMAL LOW (ref 6–20)
CO2: 29 mmol/L (ref 22–32)
Calcium: 7.5 mg/dL — ABNORMAL LOW (ref 8.9–10.3)
Chloride: 102 mmol/L (ref 101–111)
Creatinine, Ser: 0.41 mg/dL — ABNORMAL LOW (ref 0.44–1.00)
GFR calc non Af Amer: >60 mL/min (ref >60)
Glucose, Bld: 143 mg/dL — ABNORMAL HIGH (ref 65–99)
Potassium: 3.7 mmol/L (ref 3.5–5.1)
SODIUM: 135 mmol/L (ref 135–145)

## 2014-09-14 MED ORDER — ACETAMINOPHEN 10 MG/ML IV SOLN
1000.0000 mg | Freq: Three times a day (TID) | INTRAVENOUS | Status: DC
  Administered 2014-09-14 – 2014-09-15 (×2): 1000 mg via INTRAVENOUS
  Filled 2014-09-14 (×3): qty 100

## 2014-09-14 MED ORDER — BISACODYL 10 MG RE SUPP
10.0000 mg | Freq: Once | RECTAL | Status: AC
  Administered 2014-09-14: 10 mg via RECTAL
  Filled 2014-09-14: qty 1

## 2014-09-14 NOTE — Progress Notes (Signed)
Patient ID: Hannah [name], female   DOB: 05-22-1940, 74 y.o.   MRN: [id number] Patient ID: Hannah [name], female   DOB: [birth year], 74 y.o.   MRN: [id number] Patient ID: Hannah [name], female   DOB: 07-13-1940, 74 y.o.   MRN: [id number] Patient ID: Hannah [name], female   DOB: [birth year], 74 y.o.   MRN: [id number] Hannah [name] is a 74 y.o. female   SUBJECTIVE:  Patient admitted with severe abdominal pain, went to the OR 5/31, finding perforated small bowel with pelvic abscess. Feels much better this AM. Hypokalemia has been corrected. ______________________________________________________________________  ROS: Review of systems is unremarkable for any active cardiac,respiratory,  GU, hematologic, neurologic or psychiatric systems.  . cefTRIAXone (ROCEPHIN)  IV  1 g Intravenous Q24H  . clindamycin (CLEOCIN) IV  600 mg Intravenous 3 times per day  . famotidine (PEPCID) IV  20 mg Intravenous Q12H  . insulin aspart  0-9 Units Subcutaneous TID WC  . metoprolol tartrate  25 mg Oral BID  . phenazopyridine  100 mg Oral TID WC  . traMADol  100 mg Oral 4 times per day   menthol-cetylpyridinium, ondansetron (ZOFRAN) IV, phenol, traMADol   Past Medical History  Diagnosis Date  . Hypertension   . Hyperlipidemia   . Mitral valve prolapse     Past Surgical History  Procedure Laterality Date  . Back surgery    . Abdominal hysterectomy    . Laparotomy N/A 09/09/2014    Procedure: EXPLORATORY LAPAROTOMY, small bowel resection;  Surgeon: Dia Crawford III, MD;  Location: [facility];  Service: General;  Laterality: N/A;  . Bowel resection  09/09/2014    Procedure: , bladder instilation;  Surgeon: Dia Crawford III, MD;  Location: [facility];  Service: General;;  . Cardiac catheterization Right 09/09/2014    Procedure: CENTRAL LINE INSERTION;  Surgeon: Dia Crawford III, MD;  Location: [facility];  Service: General;  Laterality: Right;    PHYSICAL EXAM:  BP 138/61 mmHg  Pulse 101   Temp(Src) 98.7 F (37.1 C) (Oral)  Resp 16  Ht 5\' 6"  (1.676 m)  Wt 60.283 kg (132 lb 14.4 oz)  BMI 21.46 kg/m2  SpO2 95%  Wt Readings from Last 3 Encounters:  09/14/14 60.283 kg (132 lb 14.4 oz)           BP Readings from Last 3 Encounters:  09/14/14 138/61    Constitutional: NAD Neck: supple, no thyromegaly Respiratory: CTA, no rales or wheezes Cardiovascular: RRR, no murmur, no gallop, short runs of bigeminy noted. Abdomen: Mildly distended, mild diffuse tenderness, NG  In place. Extremities: no edema Neuro: alert and oriented, no focal motor or sensory deficits  ASSESSMENT/PLAN:  Labs and imaging studies were reviewed  Sinus tachycardia-resolved ; bigeminy noted Hypotension-resolved post IV fluids Perforated small bowel-postop, surgical care, discussed husband Using incentive spirometry

## 2014-09-14 NOTE — Progress Notes (Signed)
Subjective:   She is improving over the last 24 hours. She continues to have mild gas pain but no further nausea or vomiting. She does have moderate nasogastric drainage all which is bilious. She is breathing comfortably and able to get out of bed with assistance. Her pain control is moderate but she refuses any more pain medication. She continues on IV antibiotics.  Vital signs in last 24 hours: Temp:  [98.1 F (36.7 C)-98.8 F (37.1 C)] 98.7 F (37.1 C) (06/05 0742) Pulse Rate:  [71-101] 101 (06/05 0742) Resp:  [16-18] 16 (06/05 0742) BP: (138-158)/(61-68) 138/61 mmHg (06/05 0742) SpO2:  [95 %-96 %] 95 % (06/05 0742) Weight:  [60.283 kg (132 lb 14.4 oz)] 60.283 kg (132 lb 14.4 oz) (06/05 0500) Last BM Date: 09/07/14  Intake/Output from previous day: 06/04 0701 - 06/05 0700 In: 2811 [I.V.:2081; NG/GT:180; IV Piggyback:200] Out: 6283 [Urine:4600; Emesis/NG output:325]  Exam:  Her abdomen is soft with no significant tenderness other than incisional tenderness. She has active bowel sounds but is mildly tympanitic.  Lab Results:  CBC  Recent Labs  09/13/14 0812 09/14/14 0410  WBC 12.7* 12.2*  HGB 11.1* 11.1*  HCT 33.9* 33.1*  PLT 313 341   CMP     Component Value Date/Time   NA 135 09/14/2014 0410   K 3.7 09/14/2014 0410   CL 102 09/14/2014 0410   CO2 29 09/14/2014 0410   GLUCOSE 143* 09/14/2014 0410   BUN <5* 09/14/2014 0410   CREATININE 0.41* 09/14/2014 0410   CALCIUM 7.5* 09/14/2014 0410   PROT 5.3* 09/12/2014 0545   ALBUMIN 2.2* 09/12/2014 0545   AST 16 09/12/2014 0545   ALT 17 09/12/2014 0545   ALKPHOS 58 09/12/2014 0545   BILITOT 0.8 09/12/2014 0545   GFRNONAA >60 09/14/2014 0410   GFRAA >60 09/14/2014 0410   PT/INR No results for input(s): LABPROT, INR in the last 72 hours.  Studies/Results: No results found.  Assessment/Plan: She continues to improve. Pathology did not reveal any malignancy or evidence for infection. The injury appears to be  traumatic. Were going to try to give her some Dulcolax today to stimulate some bowel function so we can discontinue her nasogastric suction. I still emphasized need for pain control but she and her family refuse any narcotic medications. Her laboratory values are still stable. Overall she's doing quite well. We'll increase her activity level as possible and try to remove her nasogastric tube when she begins to pass gas. This plan was discussed with the family in detail and they were all in agreement.

## 2014-09-15 LAB — GLUCOSE, CAPILLARY
GLUCOSE-CAPILLARY: 138 mg/dL — AB (ref 65–99)
GLUCOSE-CAPILLARY: 121 mg/dL — AB (ref 65–99)
GLUCOSE-CAPILLARY: 116 mg/dL — AB (ref 65–99)
Glucose-Capillary: 143 mg/dL — ABNORMAL HIGH (ref 65–99)

## 2014-09-15 MED ORDER — DIPHENHYDRAMINE HCL 50 MG/ML IJ SOLN: 25.0000 mg | Freq: Four times a day (QID) | INTRAMUSCULAR | Status: DC | PRN

## 2014-09-15 MED ORDER — PROMETHAZINE HCL 25 MG/ML IJ SOLN: 25.0000 mg | Freq: Four times a day (QID) | INTRAMUSCULAR | Status: DC | PRN

## 2014-09-15 MED ORDER — ALVIMOPAN 12 MG PO CAPS
12.0000 mg | ORAL_CAPSULE | Freq: Two times a day (BID) | ORAL | Status: DC
  Administered 2014-09-16 – 2014-09-17 (×3): 12 mg via ORAL
  Filled 2014-09-15 (×4): qty 1

## 2014-09-15 MED ORDER — PROMETHAZINE HCL 25 MG PO TABS: 25.0000 mg | ORAL_TABLET | Freq: Four times a day (QID) | ORAL | Status: DC | PRN

## 2014-09-15 MED ORDER — KETOROLAC TROMETHAMINE 15 MG/ML IJ SOLN
15.0000 mg | Freq: Four times a day (QID) | INTRAMUSCULAR | Status: DC
  Administered 2014-09-15 – 2014-09-19 (×15): 15 mg via INTRAVENOUS
  Filled 2014-09-15 (×15): qty 1

## 2014-09-15 NOTE — Progress Notes (Signed)
Patient ID: Hannah [name], female   DOB: Sep 19, 1940, 74 y.o.   MRN: [id number] Patient ID: Hannah [name], female   DOB: [birth year], 74 y.o.   MRN: [id number] Patient ID: Hannah [name], female   DOB: [birth year], 74 y.o.   MRN: [id number] Hannah [name] is a 74 y.o. female   SUBJECTIVE:  Patient admitted with severe abdominal pain, went to the OR 5/31, finding perforated small bowel with pelvic abscess. NG tube in post nausea vomiting, feels some better today ______________________________________________________________________  ROS: Review of systems is unremarkable for any active cardiac,respiratory, GI, GU, hematologic, neurologic or psychiatric systems, 10 systems reviewed.  Marland Kitchen acetaminophen  1,000 mg Intravenous 3 times per day  . cefTRIAXone (ROCEPHIN)  IV  1 g Intravenous Q24H  . clindamycin (CLEOCIN) IV  600 mg Intravenous 3 times per day  . famotidine (PEPCID) IV  20 mg Intravenous Q12H  . insulin aspart  0-9 Units Subcutaneous TID WC  . metoprolol tartrate  25 mg Oral BID  . phenazopyridine  100 mg Oral TID WC  . traMADol  100 mg Oral 4 times per day   menthol-cetylpyridinium, ondansetron (ZOFRAN) IV, phenol, traMADol   Past Medical History  Diagnosis Date  . Hypertension   . Hyperlipidemia   . Mitral valve prolapse     Past Surgical History  Procedure Laterality Date  . Back surgery    . Abdominal hysterectomy    . Laparotomy N/A 09/09/2014    Procedure: EXPLORATORY LAPAROTOMY, small bowel resection;  Surgeon: Dia Crawford III, MD;  Location: [facility];  Service: General;  Laterality: N/A;  . Bowel resection  09/09/2014    Procedure: , bladder instilation;  Surgeon: Dia Crawford III, MD;  Location: [facility];  Service: General;;  . Cardiac catheterization Right 09/09/2014    Procedure: CENTRAL LINE INSERTION;  Surgeon: Dia Crawford III, MD;  Location: [facility];  Service: General;  Laterality: Right;    PHYSICAL EXAM:  BP 122/51 mmHg  Pulse 78  Temp(Src)  98.4 F (36.9 C) (Oral)  Resp 14  Ht 5\' 6"  (1.676 m)  Wt 60.283 kg (132 lb 14.4 oz)  BMI 21.46 kg/m2  SpO2 98%  Wt Readings from Last 3 Encounters:  09/14/14 60.283 kg (132 lb 14.4 oz)           BP Readings from Last 3 Encounters:  09/15/14 122/51    Constitutional: NAD Neck: supple, no thyromegaly Respiratory: CTA, no rales or wheezes Cardiovascular: RRR, no murmur, no gallop, tachycardia Abdomen: Mildly distended, bowel sounds present, mild diffuse tenderness Extremities: no edema Neuro: alert and oriented, no focal motor or sensory deficits  ASSESSMENT/PLAN:  Labs and imaging studies were reviewed  Sinus tachycardia-resolved with usual dose metoprolol Hypotension-resolved post IV fluids Perforated small bowel-postop, surgical care, using incentive spirometry, hopefully can get NG tube out and Foley today

## 2014-09-15 NOTE — Progress Notes (Signed)
6 Days Post-Op   Subjective:  Passing small amounts of flatus. No bowel movement. Not burping very much. No nausea. She says that she does not like taking the Toradol. Overall feeling slightly better.  Vital signs in last 24 hours: Temp:  [98.4 F (36.9 C)-100.6 F (38.1 C)] 98.8 F (37.1 C) (06/06 0757) Pulse Rate:  [78-102] 92 (06/06 0757) Resp:  [14-16] 14 (06/06 0046) BP: (122-160)/(51-66) 160/63 mmHg (06/06 0757) SpO2:  [95 %-98 %] 96 % (06/06 0757) Last BM Date: 09/07/14  Intake/Output from previous day: 06/05 0701 - 06/06 0700 In: 2582.9 [I.V.:2382.9; IV Piggyback:200] Out: 3150 [Urine:3000; Emesis/NG output:150]  GI: soft, non-tender; bowel sounds normal; no masses,  no organomegaly  Lab Results:  CBC  Recent Labs  09/13/14 0812 09/14/14 0410  WBC 12.7* 12.2*  HGB 11.1* 11.1*  HCT 33.9* 33.1*  PLT 313 341   CMP     Component Value Date/Time   NA 135 09/14/2014 0410   K 3.7 09/14/2014 0410   CL 102 09/14/2014 0410   CO2 29 09/14/2014 0410   GLUCOSE 143* 09/14/2014 0410   BUN <5* 09/14/2014 0410   CREATININE 0.41* 09/14/2014 0410   CALCIUM 7.5* 09/14/2014 0410   PROT 5.3* 09/12/2014 0545   ALBUMIN 2.2* 09/12/2014 0545   AST 16 09/12/2014 0545   ALT 17 09/12/2014 0545   ALKPHOS 58 09/12/2014 0545   BILITOT 0.8 09/12/2014 0545   GFRNONAA >60 09/14/2014 0410   GFRAA >60 09/14/2014 0410   PT/INR No results for input(s): LABPROT, INR in the last 72 hours.  Studies/Results: No results found.  Assessment/Plan: Slightly improved with a bilious but low volume nasogastric output and passing some flatus. I discontinued her Pyridium as it is an anti-cholinergic. I will discontinue her nasogastric tube and keep her on limited sips and ice chips. Entereg, Benadryl, Toradol, Phenergan.

## 2014-09-15 NOTE — Care Management (Signed)
Spoke with patient and daughter. Patient was independent from home with husband and was driving self prior to admission. Dr Sabra Heck is PCP. Uses no DME at home.  Has had [facility] in the past for PT. Continue to follow for discharge needs.

## 2014-09-15 NOTE — Progress Notes (Signed)
Nutrition Follow-up  NUTRITION DIAGNOSIS:  Inadequate oral intake related to inability to eat as evidenced by NPO status, ongoing  GOAL:  Goal for diet advancement as medically able within 1-3 days  MONITOR:   (Energy Intake, Electrolyte and Renal Profile, Digestive System)  REASON FOR ASSESSMENT:   (Follow-Up)    ASSESSMENT:  Per MD note, NG tube to be removed today.  No BM, +flatus, no nausea thus far today. Last BM: 5/29  Current Nutrition: pt taking sips of liquids, and ice chips. Per RN Alecia over the weekend, pt was allowed sips of liquids from the floor but pt was having increasing nausea and therefore d/c sips with NG tube placement. Per MD note pt allowed 292mL q8 hours of water or ice chips.  Anthropometrics: Filed Weights   09/07/14 1217 09/09/14 2054 09/14/14 0500  Weight: 135 lb (61.236 kg) 151 lb 8 oz (68.72 kg) 132 lb 14.4 oz (60.283 kg)   Medications: D5 0.45%NS with KCl at 171mL/hr (providing 408kcals in 24 hours) Labs: Electrolyte and Renal Profile:  Recent Labs Lab 09/12/14 0545 09/13/14 0812 09/14/14 0410  BUN 10 <5* <5*  CREATININE 0.43* 0.44 0.41*  NA 136 134* 135  K 3.1* 3.1* 3.7   Glucose Profile:  Recent Labs  09/14/14 2132 09/15/14 0828 09/15/14 1237  GLUCAP 124* 138* 121*   Protein Profile:  Recent Labs Lab 09/10/14 0512 09/11/14 0511 09/12/14 0545  ALBUMIN 2.3* 2.0* 2.2*    Estimated Nutritional Needs:  Kcal:  1589-1879kcals, BEE: 1204kcals, TEE: (IF 1.1-1.3)(AF 1.2)   Protein:  69-82g protein (1.0-1.2g/kg)  Fluid:  1718-209mL of fluid (25-74mL/kg)  Diet Order:  Diet NPO time specified  EDUCATION NEEDS:  Education needs no appropriate at this time   Intake/Output Summary (Last 24 hours) at 09/15/14 1449 Last data filed at 09/15/14 0810  Gross per 24 hour  Intake   3249 ml  Output   2100 ml  Net   1149 ml    HIGH Care Level  Dwyane Luo, RD, LDN Pager [phone]

## 2014-09-16 LAB — GLUCOSE, CAPILLARY
GLUCOSE-CAPILLARY: 138 mg/dL — AB (ref 65–99)
GLUCOSE-CAPILLARY: 141 mg/dL — AB (ref 65–99)
Glucose-Capillary: 129 mg/dL — ABNORMAL HIGH (ref 65–99)
Glucose-Capillary: 122 mg/dL — ABNORMAL HIGH (ref 65–99)

## 2014-09-16 NOTE — Care Management (Signed)
Spoke with the nurse Beth who stated that plan was to dc foley today and ambulate patient. If patient has difficulaty will request for PT evaluation.  More to follow.

## 2014-09-16 NOTE — Progress Notes (Signed)
7 Days Post-Op   Subjective:  No nausea or vomiting. Minimal burping. Passing lots of flatus. No bowel movements.  Vital signs in last 24 hours: Temp:  [98.1 F (36.7 C)-98.8 F (37.1 C)] 98.8 F (37.1 C) (06/07 0900) Pulse Rate:  [83-89] 89 (06/07 0900) Resp:  [16-18] 18 (06/07 0900) BP: (126-145)/(41-59) 145/58 mmHg (06/07 0900) SpO2:  [96 %-97 %] 97 % (06/07 0900) Last BM Date: 09/15/14  Intake/Output from previous day: 06/06 0701 - 06/07 0700 In: 5598.3 [I.V.:2623.3] Out: 1950 [Urine:1850; Emesis/NG output:100]  GI: soft, non-tender; bowel sounds normal; no masses,  no organomegaly  Lab Results:  CBC  Recent Labs  09/14/14 0410  WBC 12.2*  HGB 11.1*  HCT 33.1*  PLT 341   CMP     Component Value Date/Time   NA 135 09/14/2014 0410   K 3.7 09/14/2014 0410   CL 102 09/14/2014 0410   CO2 29 09/14/2014 0410   GLUCOSE 143* 09/14/2014 0410   BUN <5* 09/14/2014 0410   CREATININE 0.41* 09/14/2014 0410   CALCIUM 7.5* 09/14/2014 0410   PROT 5.3* 09/12/2014 0545   ALBUMIN 2.2* 09/12/2014 0545   AST 16 09/12/2014 0545   ALT 17 09/12/2014 0545   ALKPHOS 58 09/12/2014 0545   BILITOT 0.8 09/12/2014 0545   GFRNONAA >60 09/14/2014 0410   GFRAA >60 09/14/2014 0410   PT/INR No results for input(s): LABPROT, INR in the last 72 hours.  Studies/Results: No results found.  Assessment/Plan: Improving bowel function Clear liquid diet Coffee per patient request

## 2014-09-16 NOTE — Progress Notes (Signed)
Contacted Dr. Leanora Cover - foley removed around noon today; patient has only been able to void miniscule amounts since and is complaining of severe pain.  Bladder scan reveals 400 ml.  Orders received to reinsert foley.

## 2014-09-16 NOTE — Progress Notes (Signed)
Patient ID: Hannah [name], female   DOB: [birth year], 74 y.o.   MRN: [id number] Patient ID: Hannah [name], female   DOB: 07/30/1940, 74 y.o.   MRN: [id number] Patient ID: Hannah [name], female   DOB: Jun 03, 1940, 74 y.o.   MRN: [id number] Patient ID: Hannah [name], female   DOB: 12-22-1940, 74 y.o.   MRN: [id number] Hannah [name] is a 74 y.o. female   SUBJECTIVE:  Patient admitted with severe abdominal pain, went to the OR 5/31, finding perforated small bowel with pelvic abscess. NG tube is out, feels somewhat better, very hungry ______________________________________________________________________  ROS: Review of systems is unremarkable for any active cardiac,respiratory, GI, GU, hematologic, neurologic or psychiatric systems, 10 systems reviewed.  Marland Kitchen alvimopan  12 mg Oral BID  . cefTRIAXone (ROCEPHIN)  IV  1 g Intravenous Q24H  . clindamycin (CLEOCIN) IV  600 mg Intravenous 3 times per day  . famotidine (PEPCID) IV  20 mg Intravenous Q12H  . insulin aspart  0-9 Units Subcutaneous TID WC  . ketorolac  15 mg Intravenous 4 times per day  . metoprolol tartrate  25 mg Oral BID   diphenhydrAMINE, menthol-cetylpyridinium, ondansetron (ZOFRAN) IV, phenol, promethazine **OR** promethazine, traMADol   Past Medical History  Diagnosis Date  . Hypertension   . Hyperlipidemia   . Mitral valve prolapse     Past Surgical History  Procedure Laterality Date  . Back surgery    . Abdominal hysterectomy    . Laparotomy N/A 09/09/2014    Procedure: EXPLORATORY LAPAROTOMY, small bowel resection;  Surgeon: Dia Crawford III, MD;  Location: [facility];  Service: General;  Laterality: N/A;  . Bowel resection  09/09/2014    Procedure: , bladder instilation;  Surgeon: Dia Crawford III, MD;  Location: [facility];  Service: General;;  . Cardiac catheterization Right 09/09/2014    Procedure: CENTRAL LINE INSERTION;  Surgeon: Dia Crawford III, MD;  Location: [facility];  Service: General;  Laterality:  Right;    PHYSICAL EXAM:  BP 126/41 mmHg  Pulse 83  Temp(Src) 98.1 F (36.7 C) (Oral)  Resp 16  Ht 5\' 6"  (1.676 m)  Wt 60.283 kg (132 lb 14.4 oz)  BMI 21.46 kg/m2  SpO2 96%  Wt Readings from Last 3 Encounters:  09/14/14 60.283 kg (132 lb 14.4 oz)           BP Readings from Last 3 Encounters:  09/16/14 126/41    Constitutional: NAD Neck: supple, no thyromegaly Respiratory: CTA, no rales or wheezes Cardiovascular: RRR, no murmur, no gallop, tachycardia Abdomen: Mildly distended, bowel sounds present, mild diffuse tenderness Extremities: no edema Neuro: alert and oriented, no focal motor or sensory deficits  ASSESSMENT/PLAN:  Labs and imaging studies were reviewed  Sinus tachycardia-resolved with usual dose metoprolol Hypotension-resolved post IV fluids Perforated small bowel-postop, surgical care, hopefully can begin eating today

## 2014-09-17 LAB — GLUCOSE, CAPILLARY
GLUCOSE-CAPILLARY: 104 mg/dL — AB (ref 65–99)
Glucose-Capillary: 143 mg/dL — ABNORMAL HIGH (ref 65–99)
Glucose-Capillary: 148 mg/dL — ABNORMAL HIGH (ref 65–99)
Glucose-Capillary: 116 mg/dL — ABNORMAL HIGH (ref 65–99)

## 2014-09-17 MED ORDER — FAMOTIDINE 20 MG PO TABS
20.0000 mg | ORAL_TABLET | Freq: Every day | ORAL | Status: DC
  Administered 2014-09-17 – 2014-09-19 (×3): 20 mg via ORAL
  Filled 2014-09-17 (×3): qty 1

## 2014-09-17 MED ORDER — FLUCONAZOLE 100 MG PO TABS
100.0000 mg | ORAL_TABLET | Freq: Two times a day (BID) | ORAL | Status: DC
  Administered 2014-09-18 – 2014-09-19 (×3): 100 mg via ORAL
  Filled 2014-09-17 (×4): qty 1

## 2014-09-17 MED ORDER — FLUCONAZOLE 200 MG PO TABS
200.0000 mg | ORAL_TABLET | Freq: Once | ORAL | Status: AC
  Administered 2014-09-17: 200 mg via ORAL
  Filled 2014-09-17: qty 1

## 2014-09-17 MED ORDER — CLINDAMYCIN HCL 150 MG PO CAPS
300.0000 mg | ORAL_CAPSULE | Freq: Three times a day (TID) | ORAL | Status: DC
  Administered 2014-09-17 – 2014-09-19 (×7): 300 mg via ORAL
  Filled 2014-09-17 (×7): qty 2

## 2014-09-17 MED ORDER — NYSTATIN 100000 UNIT/ML MT SUSP
5.0000 mL | Freq: Four times a day (QID) | OROMUCOSAL | Status: DC
  Administered 2014-09-17 – 2014-09-18 (×5): 500000 [IU] via ORAL
  Filled 2014-09-17 (×6): qty 5

## 2014-09-17 MED ORDER — CEPHALEXIN 500 MG PO CAPS
500.0000 mg | ORAL_CAPSULE | Freq: Three times a day (TID) | ORAL | Status: DC
  Administered 2014-09-17 – 2014-09-19 (×7): 500 mg via ORAL
  Filled 2014-09-17 (×7): qty 1

## 2014-09-17 NOTE — Progress Notes (Signed)
Nutrition Follow-up  INTERVENTION: Meals and Snacks: Cater to patient preferences Medical Food Supplement Therapy: will recommend sending Mighty Shakes TID for added nutrition (300kcals and 9g protein in each shake)    NUTRITION DIAGNOSIS:  Inadequate oral intake related to inability to eat as evidenced by NPO status; improved with diet advancement this am  GOAL:  Patient will meet greater than or equal to 90% of their needs  MONITOR:   (Energy Intake, Electrolyte and Renal Profile, Digestive System)  REASON FOR ASSESSMENT:   (Follow-Up)    ASSESSMENT:  Pt now having bowel movements, diet advanced this am   Last BM:  6/8 2 soft BMs this am  Current Nutrition: pt drank 50% of broth this am and bites of jello; pt ordered french toast for breakfast this am on visit.  Medications:  D5 0.45%NS with KCl at 48mL/hr (providing 204kcals in 24 hours), Pepcid, Novolog  BMI:  Body mass index is 21.46 kg/(m^2).  Estimated Nutritional Needs:  Kcal:  1589-1879kcals, BEE: 1204kcals, TEE: (IF 1.1-1.3)(AF 1.2)   Protein:  69-82g protein (1.0-1.2g/kg)  Fluid:  1718-2041mL of fluid (25-55mL/kg)  Diet Order:  DIET SOFT Room service appropriate?: Yes; Fluid consistency:: Thin  EDUCATION NEEDS:  Education needs no appropriate at this time   Intake/Output Summary (Last 24 hours) at 09/17/14 1004 Last data filed at 09/17/14 0734  Gross per 24 hour  Intake   2289 ml  Output   4590 ml  Net  -2301 ml     MODERATE Care Level  Dwyane Luo, RD, LDN Pager [phone]

## 2014-09-17 NOTE — Progress Notes (Signed)
Subjective:   She is feeling better this morning. She had a bowel movement last night and again this morning. She is tolerating a liquid diet with no further abdominal pain. Her pain control appears to be much better over the last 48 hours.  Vital signs in last 24 hours: Temp:  [98.8 F (37.1 C)-99.4 F (37.4 C)] 98.9 F (37.2 C) (06/08 0737) Pulse Rate:  [86-99] 86 (06/08 0737) Resp:  [16-18] 16 (06/08 0737) BP: (129-157)/(54-80) 157/54 mmHg (06/08 0737) SpO2:  [92 %-97 %] 97 % (06/08 0737) Last BM Date: 09/15/14  Intake/Output from previous day: 06/07 0701 - 06/08 0700 In: 2453 [P.O.:236; I.V.:2167; IV Piggyback:50] Out: 8850 [Urine:5015]  Exam:  Her wounds look good. There is no sign of any infection. Her Penrose drain was removed. Her dressing was changed. Her abdomen is otherwise soft no significant abdominal tenderness  Lab Results:  CBC No results for input(s): WBC, HGB, HCT, PLT in the last 72 hours. CMP     Component Value Date/Time   NA 135 09/14/2014 0410   K 3.7 09/14/2014 0410   CL 102 09/14/2014 0410   CO2 29 09/14/2014 0410   GLUCOSE 143* 09/14/2014 0410   BUN <5* 09/14/2014 0410   CREATININE 0.41* 09/14/2014 0410   CALCIUM 7.5* 09/14/2014 0410   PROT 5.3* 09/12/2014 0545   ALBUMIN 2.2* 09/12/2014 0545   AST 16 09/12/2014 0545   ALT 17 09/12/2014 0545   ALKPHOS 58 09/12/2014 0545   BILITOT 0.8 09/12/2014 0545   GFRNONAA >60 09/14/2014 0410   GFRAA >60 09/14/2014 0410   PT/INR No results for input(s): LABPROT, INR in the last 72 hours.  Studies/Results: No results found.  Assessment/Plan: She continues to improve. She is set up to have her labs rechecked for tomorrow. I will switch her to by mouth antibiotics now that we are on a week since her surgery. We will advance her diet and increase her activity. Overall she's doing quite well.

## 2014-09-17 NOTE — Progress Notes (Signed)
Pt got up to [facility] several times throughout the night to try and have a BM, finally successful around 0600. Reported by the nurse tech, Stanton Kidney.

## 2014-09-17 NOTE — Progress Notes (Signed)
Patient ID: Hannah [name], female   DOB: Jul 19, 1940, 74 y.o.   MRN: [id number] Patient ID: Hannah [name], female   DOB: 04-05-1941, 74 y.o.   MRN: [id number] Patient ID: Hannah [name], female   DOB: [birth year], 74 y.o.   MRN: [id number] Patient ID: Hannah [name], female   DOB: 1940-06-12, 74 y.o.   MRN: [id number] Patient ID: [name], female   DOB: 1940-04-13, 74 y.o.   MRN: [id number] Hannah [name] is a 74 y.o. female   SUBJECTIVE:  Patient admitted with severe abdominal pain, went to the OR 5/31, finding perforated small bowel with pelvic abscess. NG tube is out, eating some, moving bowels ______________________________________________________________________  ROS: Review of systems is unremarkable for any active cardiac,respiratory, GI, GU, hematologic, neurologic or psychiatric systems, 10 systems reviewed.  Marland Kitchen alvimopan  12 mg Oral BID  . cefTRIAXone (ROCEPHIN)  IV  1 g Intravenous Q24H  . clindamycin (CLEOCIN) IV  600 mg Intravenous 3 times per day  . famotidine (PEPCID) IV  20 mg Intravenous Q12H  . insulin aspart  0-9 Units Subcutaneous TID WC  . ketorolac  15 mg Intravenous 4 times per day  . metoprolol tartrate  25 mg Oral BID   diphenhydrAMINE, menthol-cetylpyridinium, ondansetron (ZOFRAN) IV, phenol, promethazine **OR** promethazine, traMADol   Past Medical History  Diagnosis Date  . Hypertension   . Hyperlipidemia   . Mitral valve prolapse     Past Surgical History  Procedure Laterality Date  . Back surgery    . Abdominal hysterectomy    . Laparotomy N/A 09/09/2014    Procedure: EXPLORATORY LAPAROTOMY, small bowel resection;  Surgeon: Dia Crawford III, MD;  Location: [facility];  Service: General;  Laterality: N/A;  . Bowel resection  09/09/2014    Procedure: , bladder instilation;  Surgeon: Dia Crawford III, MD;  Location: [facility];  Service: General;;  . Cardiac catheterization Right 09/09/2014    Procedure: CENTRAL LINE INSERTION;   Surgeon: Dia Crawford III, MD;  Location: [facility];  Service: General;  Laterality: Right;    PHYSICAL EXAM:  BP 157/54 mmHg  Pulse 86  Temp(Src) 98.9 F (37.2 C) (Oral)  Resp 16  Ht 5\' 6"  (1.676 m)  Wt 60.283 kg (132 lb 14.4 oz)  BMI 21.46 kg/m2  SpO2 97%  Wt Readings from Last 3 Encounters:  09/14/14 60.283 kg (132 lb 14.4 oz)           BP Readings from Last 3 Encounters:  09/17/14 157/54    Constitutional: NAD Neck: supple, no thyromegaly Respiratory: CTA, no rales or wheezes Cardiovascular: RRR, no murmur, no gallop, tachycardia Abdomen: Mildly distended, bowel sounds present, mild diffuse tenderness Extremities: no edema Neuro: alert and oriented, no focal motor or sensory deficits  ASSESSMENT/PLAN:  Labs and imaging studies were reviewed  Sinus tachycardia-resolved with usual dose metoprolol Hypotension-resolved post IV fluids Perforated small bowel-postop, surgical care, making slow but steady progress

## 2014-09-17 NOTE — Progress Notes (Signed)
8 Days Post-Op   Subjective:  Bowel movement last night and another bowel movement this morning. She is hungry. She had to have a Foley catheter replaced yesterday for urinary retention. This occurred during a significant spontaneous diuresis.  Vital signs in last 24 hours: Temp:  [98.8 F (37.1 C)-99.4 F (37.4 C)] 98.9 F (37.2 C) (06/08 0737) Pulse Rate:  [86-99] 86 (06/08 0737) Resp:  [16-18] 16 (06/08 0737) BP: (129-157)/(54-80) 157/54 mmHg (06/08 0737) SpO2:  [92 %-97 %] 97 % (06/08 0737) Last BM Date: 09/15/14  Intake/Output from previous day: 06/07 0701 - 06/08 0700 In: 2453 [P.O.:236; I.V.:2167; IV Piggyback:50] Out: 9774 [Urine:5015]  GI: soft, non-tender; bowel sounds normal; no masses,  no organomegaly  Lab Results:  CBC No results for input(s): WBC, HGB, HCT, PLT in the last 72 hours. CMP     Component Value Date/Time   NA 135 09/14/2014 0410   K 3.7 09/14/2014 0410   CL 102 09/14/2014 0410   CO2 29 09/14/2014 0410   GLUCOSE 143* 09/14/2014 0410   BUN <5* 09/14/2014 0410   CREATININE 0.41* 09/14/2014 0410   CALCIUM 7.5* 09/14/2014 0410   PROT 5.3* 09/12/2014 0545   ALBUMIN 2.2* 09/12/2014 0545   AST 16 09/12/2014 0545   ALT 17 09/12/2014 0545   ALKPHOS 58 09/12/2014 0545   BILITOT 0.8 09/12/2014 0545   GFRNONAA >60 09/14/2014 0410   GFRAA >60 09/14/2014 0410   PT/INR No results for input(s): LABPROT, INR in the last 72 hours.  Studies/Results: No results found.  Assessment/Plan: Improving. Soft diet, Hep-Lock IV fluids, remove Foley catheter in the morning.

## 2014-09-18 LAB — CBC WITH DIFFERENTIAL/PLATELET
BASOS PCT: 1 %
Basophils Absolute: 0.1 10*3/uL (ref 0–0.1)
EOS ABS: 0.3 10*3/uL (ref 0–0.7)
Eosinophils Relative: 2 %
HCT: 31.9 % — ABNORMAL LOW (ref 35.0–47.0)
Hemoglobin: 10.7 g/dL — ABNORMAL LOW (ref 12.0–16.0)
LYMPHS ABS: 1.2 10*3/uL (ref 1.0–3.6)
Lymphocytes Relative: 9 %
MCH: 29.4 pg (ref 26.0–34.0)
MCHC: 33.6 g/dL (ref 32.0–36.0)
MCV: 87.6 fL (ref 80.0–100.0)
Monocytes Absolute: 1.1 10*3/uL — ABNORMAL HIGH (ref 0.2–0.9)
Monocytes Relative: 9 %
NEUTROS PCT: 79 %
Neutro Abs: 9.8 10*3/uL — ABNORMAL HIGH (ref 1.4–6.5)
Platelets: 427 10*3/uL (ref 150–440)
RBC: 3.64 MIL/uL — ABNORMAL LOW (ref 3.80–5.20)
RDW: 14.5 % (ref 11.5–14.5)
WBC: 12.4 10*3/uL — AB (ref 3.6–11.0)

## 2014-09-18 LAB — COMPREHENSIVE METABOLIC PANEL
ALBUMIN: 2.7 g/dL — AB (ref 3.5–5.0)
ALK PHOS: 142 U/L — AB (ref 38–126)
ALT: 24 U/L (ref 14–54)
ANION GAP: 9 (ref 5–15)
AST: 21 U/L (ref 15–41)
BUN: 6 mg/dL (ref 6–20)
CALCIUM: 8.4 mg/dL — AB (ref 8.9–10.3)
CO2: 25 mmol/L (ref 22–32)
Chloride: 103 mmol/L (ref 101–111)
Creatinine, Ser: 0.52 mg/dL (ref 0.44–1.00)
GFR calc Af Amer: >60 mL/min (ref >60)
GFR calc non Af Amer: >60 mL/min (ref >60)
Glucose, Bld: 123 mg/dL — ABNORMAL HIGH (ref 65–99)
POTASSIUM: 3.9 mmol/L (ref 3.5–5.1)
SODIUM: 137 mmol/L (ref 135–145)
Total Bilirubin: 0.9 mg/dL (ref 0.3–1.2)
Total Protein: 6.2 g/dL — ABNORMAL LOW (ref 6.5–8.1)

## 2014-09-18 LAB — GLUCOSE, CAPILLARY
GLUCOSE-CAPILLARY: 116 mg/dL — AB (ref 65–99)
GLUCOSE-CAPILLARY: 119 mg/dL — AB (ref 65–99)
GLUCOSE-CAPILLARY: 161 mg/dL — AB (ref 65–99)
Glucose-Capillary: 175 mg/dL — ABNORMAL HIGH (ref 65–99)

## 2014-09-18 MED ORDER — SIMETHICONE 80 MG PO CHEW
80.0000 mg | CHEWABLE_TABLET | ORAL | Status: DC | PRN
  Administered 2014-09-18: 80 mg via ORAL
  Filled 2014-09-18: qty 1

## 2014-09-18 MED ORDER — SIMETHICONE 80 MG PO CHEW
80.0000 mg | CHEWABLE_TABLET | ORAL | Status: DC
  Administered 2014-09-18: 80 mg via ORAL
  Filled 2014-09-18: qty 1

## 2014-09-18 MED ORDER — AMLODIPINE BESYLATE 5 MG PO TABS
5.0000 mg | ORAL_TABLET | Freq: Every day | ORAL | Status: DC
  Administered 2014-09-18 – 2014-09-19 (×2): 5 mg via ORAL
  Filled 2014-09-18 (×2): qty 1

## 2014-09-18 NOTE — Progress Notes (Signed)
9 Days Post-Op   Subjective:  She feels a little diaphoretic this morning with no nausea or vomiting. She's not demonstrate any fever over the course of the evening. Vital signs remained stable. She's been up in a chair and has been ambulating around the room some. She required replacement of Foley catheter. Her white blood cell count is up to 12,000 but otherwise her laboratory values look good. She is tolerating a diet.  Vital signs in last 24 hours: Temp:  [98.2 F (36.8 C)-99 F (37.2 C)] 98.2 F (36.8 C) (06/09 0806) Pulse Rate:  [81-100] 89 (06/09 0806) Resp:  [16-20] 20 (06/09 0806) BP: (163-168)/(66-73) 163/72 mmHg (06/09 0806) SpO2:  [93 %-96 %] 96 % (06/09 0806) Last BM Date: 08/17/14  Intake/Output from previous day: 06/08 0701 - 06/09 0700 In: 240 [P.O.:240] Out: 3550 [Urine:3550]  GI: Abdomen is soft with minimal drainage from her Penrose sites. She has active bowel sounds. She does not have any significant abdominal tenderness.  Lab Results:  CBC  Recent Labs  09/18/14 0545  WBC 12.4*  HGB 10.7*  HCT 31.9*  PLT 427   CMP     Component Value Date/Time   NA 137 09/18/2014 0545   K 3.9 09/18/2014 0545   CL 103 09/18/2014 0545   CO2 25 09/18/2014 0545   GLUCOSE 123* 09/18/2014 0545   BUN 6 09/18/2014 0545   CREATININE 0.52 09/18/2014 0545   CALCIUM 8.4* 09/18/2014 0545   PROT 6.2* 09/18/2014 0545   ALBUMIN 2.7* 09/18/2014 0545   AST 21 09/18/2014 0545   ALT 24 09/18/2014 0545   ALKPHOS 142* 09/18/2014 0545   BILITOT 0.9 09/18/2014 0545   GFRNONAA >60 09/18/2014 0545   GFRAA >60 09/18/2014 0545   PT/INR No results for input(s): LABPROT, INR in the last 72 hours.  Studies/Results: No results found.  Assessment/Plan: We will continue to monitor her through today. She feels as though the enteroenteric has given her more abdominal distention and bloating so we will stop that since she has had a bowel movement. She did not have any significant  abdominal pain. We will increase her activity and see how she does over the next couple of days. We began to discuss discharge planning with her.

## 2014-09-19 LAB — GLUCOSE, CAPILLARY
Glucose-Capillary: 121 mg/dL — ABNORMAL HIGH (ref 65–99)
Glucose-Capillary: 173 mg/dL — ABNORMAL HIGH (ref 65–99)

## 2014-09-19 MED ORDER — FLUCONAZOLE 100 MG PO TABS
100.0000 mg | ORAL_TABLET | Freq: Two times a day (BID) | ORAL | Status: DC
Start: 2014-09-19 — End: 2014-10-01

## 2014-09-19 MED ORDER — ENSURE ENLIVE PO LIQD
237.0000 mL | Freq: Two times a day (BID) | ORAL | Status: DC
  Administered 2014-09-19: 237 mL via ORAL

## 2014-09-19 MED ORDER — TRAMADOL HCL 50 MG PO TABS: 50.0000 mg | ORAL_TABLET | Freq: Four times a day (QID) | ORAL | Status: DC | PRN

## 2014-09-19 NOTE — Care Management Note (Signed)
Case Management Note  Patient Details  Name: Hannah [name] MRN: [id number] Date of Birth: 08/16/1940  Subjective/Objective:     No discharge needs identified. Spoke with pt and she has good support system, ambulating and independent with adls.                Action/Plan:   Expected Discharge Date:  09/09/14               Expected Discharge Plan:     In-House Referral:  Clinical Social Work  Discharge planning Services  CM Consult  Post Acute Care Choice:  Home Health, Durable Medical Equipment Choice offered to:  Patient, Adult Children  DME Arranged:    DME Agency:  [place]:    [place] Agency:     Status of Service:  Completed, signed off  Medicare Important Message Given:  Yes Date Medicare IM Given:  09/08/14 Medicare IM give by:  Marshell Garfinkel Date Additional Medicare IM Given:  09/19/14 Additional Medicare Important Message give by:  Orvan July  If discussed at Long Length of Stay Meetings, dates discussed:    Additional Comments:  Jolly Mango, RN 09/19/2014, 10:26 AM

## 2014-09-19 NOTE — Progress Notes (Signed)
Patient ID: Porschia Willbanks, female   DOB: September 30, 1940, 74 y.o.   MRN: [id number] Patient ID: Nusaiba Guallpa, female   DOB: 1941/01/19, 74 y.o.   MRN: [id number] Patient ID: Nikitia Asbill, female   DOB: 1940/07/01, 74 y.o.   MRN: [id number] Patient ID: Marquita Lias, female   DOB: 11-19-1940, 74 y.o.   MRN: [id number] Patient ID: Samyukta Cura, female   DOB: 1940-09-02, 74 y.o.   MRN: [id number] Patient ID: Soundra Lampley, female   DOB: 01-11-1941, 74 y.o.   MRN: [id number] Geriann Lafont is a 74 y.o. female   SUBJECTIVE:  Patient admitted with severe abdominal pain, went to the OR 5/31, finding perforated small bowel with pelvic abscess. NG and Foley out, walking. Not eating much ______________________________________________________________________  ROS: Review of systems is unremarkable for any active cardiac,respiratory, GI, GU, hematologic, neurologic or psychiatric systems, 10 systems reviewed.  Marland Kitchen amLODipine  5 mg Oral Daily  . cephALEXin  500 mg Oral 3 times per day  . clindamycin  300 mg Oral 3 times per day  . famotidine  20 mg Oral Daily  . feeding supplement (ENSURE ENLIVE)  237 mL Oral BID BM  . fluconazole  100 mg Oral BID  . insulin aspart  0-9 Units Subcutaneous TID WC  . ketorolac  15 mg Intravenous 4 times per day  . metoprolol tartrate  25 mg Oral BID  . nystatin  5 mL Oral QID   diphenhydrAMINE, menthol-cetylpyridinium, ondansetron (ZOFRAN) IV, phenol, promethazine **OR** promethazine, simethicone, traMADol   Past Medical History  Diagnosis Date  . Hypertension   . Hyperlipidemia   . Mitral valve prolapse     Past Surgical History  Procedure Laterality Date  . Back surgery    . Abdominal hysterectomy    . Laparotomy N/A 09/09/2014    Procedure: EXPLORATORY LAPAROTOMY, small bowel resection;  Surgeon: Dia Crawford III, MD;  Location: [facility] ORS;  Service: General;  Laterality: N/A;  . Bowel resection  09/09/2014    Procedure: , bladder  instilation;  Surgeon: Dia Crawford III, MD;  Location: [facility] ORS;  Service: General;;  . Cardiac catheterization Right 09/09/2014    Procedure: CENTRAL LINE INSERTION;  Surgeon: Dia Crawford III, MD;  Location: [facility] ORS;  Service: General;  Laterality: Right;    PHYSICAL EXAM:  BP 158/72 mmHg  Pulse 88  Temp(Src) 98.3 F (36.8 C) (Oral)  Resp 19  Ht 5\' 6"  (1.676 m)  Wt 60.283 kg (132 lb 14.4 oz)  BMI 21.46 kg/m2  SpO2 97%  Wt Readings from Last 3 Encounters:  09/14/14 60.283 kg (132 lb 14.4 oz)           BP Readings from Last 3 Encounters:  09/18/14 158/72    Constitutional: NAD Neck: supple, no thyromegaly Respiratory: CTA, no rales or wheezes Cardiovascular: RRR, no murmur, no gallop, tachycardia Abdomen: Mildly distended, bowel sounds present, mild diffuse tenderness Extremities: no edema Neuro: alert and oriented, no focal motor or sensory deficits  ASSESSMENT/PLAN:  Labs and imaging studies were reviewed  Hypertension-stable on metoprolol and Norvasc Postop-surgical care, slow improvement Poor nutrition-add ensure twice a day

## 2014-09-19 NOTE — Progress Notes (Signed)
10 Days Post-Op   Subjective:  Sitting up in chair, eating fairly well, ambulating in the halls, having bowel movements. She feels much better, and wishes to go home.  Vital signs in last 24 hours: Temp:  [98.3 F (36.8 C)-98.4 F (36.9 C)] 98.3 F (36.8 C) (06/09 2323) Pulse Rate:  [88-108] 88 (06/09 2323) Resp:  [19] 19 (06/09 2323) BP: (137-158)/(61-111) 158/72 mmHg (06/09 2332) SpO2:  [97 %] 97 % (06/09 2323) Last BM Date: 09/18/14  Intake/Output from previous day: 06/09 0701 - 06/10 0700 In: 780 [P.O.:780] Out: 1425 [Urine:1425]  GI: soft, non-tender; bowel sounds normal; no masses,  no organomegaly  Lab Results:  CBC  Recent Labs  09/18/14 0545  WBC 12.4*  HGB 10.7*  HCT 31.9*  PLT 427   CMP     Component Value Date/Time   NA 137 09/18/2014 0545   K 3.9 09/18/2014 0545   CL 103 09/18/2014 0545   CO2 25 09/18/2014 0545   GLUCOSE 123* 09/18/2014 0545   BUN 6 09/18/2014 0545   CREATININE 0.52 09/18/2014 0545   CALCIUM 8.4* 09/18/2014 0545   PROT 6.2* 09/18/2014 0545   ALBUMIN 2.7* 09/18/2014 0545   AST 21 09/18/2014 0545   ALT 24 09/18/2014 0545   ALKPHOS 142* 09/18/2014 0545   BILITOT 0.9 09/18/2014 0545   GFRNONAA >60 09/18/2014 0545   GFRAA >60 09/18/2014 0545   PT/INR No results for input(s): LABPROT, INR in the last 72 hours.  Studies/Results: No results found.  Assessment/Plan: Satisfactory recovery. Discharge home. All of her questions and all of the questions from her family were answered.

## 2014-09-22 NOTE — Discharge Summary (Signed)
Patient ID: Guenevere Roorda MRN: [id number] DOB/AGE: [birth year] 74 y.o.  Admit date: 09/07/2014 Discharge date: 09/22/2014  Discharge Diagnoses:  Traumatic small bowel perforation  Procedures Performed: Exploratory laparotomy and small bowel resection  Discharged Condition: fair  Hospital Course: She presented emergency room after a fall suffered on a slippery stone patio. She suffered significant left lower quadrant left flank pain. Workup in the emergency room initially was CT scan suggested possible ruptured bladder. A CT cystogram was performed which did not demonstrate any evidence of bladder injury. She was admitted to the hospital control her pain. She initially did well post admission but after approximately 36 hours her abdominal pain began to worsen she has some mild fever and significant peritoneal signs on examination.  Repeat CT scan demonstrated what appeared to be pelvic fluid and air consistent with possible perforated viscus. She was taken urgently to surgery that evening where she is found to have a small rupture of her distal small bowel approximately 2 feet from the ileocecal valve. The injury appeared to be traumatic likely from a previous adhesion during her fall.  She had very slow improvement of her bowel function over the next several days. She had persistent abdominal pain and refused to take any narcotic medications because of her long-standing allergies. She had no significant wound problems. She continued to improve a very slow rate was eventually able to take a solid diet and have near normal bowel function. She denied any urinary symptoms. She was discharged with plan to follow her up in the office in 7-10 days time. Bathing activity and driving instructions were given the patient and her family.  Discharge Orders: Discharge Instructions    Remove staples    Complete by:  As directed   Remove every other staple, benzoin and full length Steri-Strips in between  the remaining staples.           Disposition: 01-Home or Self Care  Discharge Medications: No current facility-administered medications for this encounter.  Current outpatient prescriptions:  .  amLODipine (NORVASC) 5 MG tablet, Take 5 mg by mouth daily., Disp: , Rfl:  .  metoprolol (LOPRESSOR) 50 MG tablet, Take 50 mg by mouth 2 (two) times daily., Disp: , Rfl:  .  fluconazole (DIFLUCAN) 100 MG tablet, Take 1 tablet (100 mg total) by mouth 2 (two) times daily at 10 AM and 5 PM., Disp: 14 tablet, Rfl: 0 .  traMADol (ULTRAM) 50 MG tablet, Take 1 tablet (50 mg total) by mouth every 6 (six) hours as needed for moderate pain., Disp: 30 tablet, Rfl: 0  Follwup: Follow-up Information    Follow up with Sherri Rad, MD. Go on 10/01/2014.   Specialties:  Surgery, Radiology   Why:  at 4:15pm for hospital follow-up, in the [facility] office (please bring insurance card and drivers license)   Contact information:   [place] [address] [phone]       Signed: Dia Crawford III 09/22/2014, 8:45 PM

## 2014-09-26 LAB — ANAEROBIC CULTURE

## 2014-10-01 ENCOUNTER — Encounter: Payer: Self-pay | Admitting: Surgery

## 2014-10-01 ENCOUNTER — Ambulatory Visit (INDEPENDENT_AMBULATORY_CARE_PROVIDER_SITE_OTHER): Payer: Medicare Other | Admitting: Surgery

## 2014-10-01 VITALS — BP 151/69 | HR 83 | Temp 97.9°F | Ht 66.0 in | Wt 128.2 lb

## 2014-10-01 DIAGNOSIS — K631 Perforation of intestine (nontraumatic): Secondary | ICD-10-CM

## 2014-10-01 NOTE — Patient Instructions (Signed)
Follow-up as needed. Please call our office with any questions or concerns. 

## 2014-10-06 ENCOUNTER — Other Ambulatory Visit: Payer: Self-pay

## 2014-10-06 DIAGNOSIS — K631 Perforation of intestine (nontraumatic): Secondary | ICD-10-CM | POA: Insufficient documentation

## 2014-10-06 NOTE — Progress Notes (Signed)
Patient ID: Hannah [name], female   DOB: 1941/01/06, 75 y.o.   MRN: [id number]   First post op follow up S/p ex lap and repair of small bowel injury with small bowel resection  Prolonged hospital stay and ileus  Today feels well, no pain reported.  Her abdomen is soft and nontender on examination and staples were removed. There is no sign of infection or incisional hernia present.  Impression overall doing well status post small bowel resection with small bowel injury.  Land activity as tolerated. We will see the patient back in the office as needed.

## 2014-10-07 ENCOUNTER — Encounter: Payer: Self-pay | Admitting: Physical Therapy

## 2014-10-07 ENCOUNTER — Ambulatory Visit: Payer: Medicare Other | Attending: Internal Medicine | Admitting: Physical Therapy

## 2014-10-07 DIAGNOSIS — R262 Difficulty in walking, not elsewhere classified: Secondary | ICD-10-CM

## 2014-10-07 DIAGNOSIS — M545 Low back pain: Secondary | ICD-10-CM | POA: Diagnosis present

## 2014-10-07 DIAGNOSIS — M25552 Pain in left hip: Secondary | ICD-10-CM

## 2014-10-07 NOTE — Therapy (Signed)
[place] MAIN [facility] SERVICES [address] [place], [place], [address] Phone: [phone]   Fax:  [phone]  Physical Therapy Evaluation  Patient Details  Name: Hannah [name] MRN: [id number] Date of Birth: 06/15/1940 Referring Provider:  [name], MD  Encounter Date: 10/07/2014      PT End of Session - 10/07/14 1501    Visit Number 1   Number of Visits 13   Date for PT Re-Evaluation 11/18/14   PT Start Time 1350   PT Stop Time 1455   PT Time Calculation (min) 65 min   Activity Tolerance Patient tolerated treatment well;Patient limited by pain   Behavior During Therapy [facility] for tasks assessed/performed      Past Medical History  Diagnosis Date  . Hypertension   . Hyperlipidemia   . Mitral valve prolapse     Past Surgical History  Procedure Laterality Date  . Back surgery  35 years  . Abdominal hysterectomy    . Laparotomy N/A 09/09/2014    Procedure: EXPLORATORY LAPAROTOMY, small bowel resection;  Surgeon: Dia Crawford III, MD;  Location: [facility] ORS;  Service: General;  Laterality: N/A;  . Bowel resection  09/09/2014    Procedure: , bladder instilation;  Surgeon: Dia Crawford III, MD;  Location: [facility] ORS;  Service: General;;  . Cardiac catheterization Right 09/09/2014    Procedure: CENTRAL LINE INSERTION;  Surgeon: Dia Crawford III, MD;  Location: [facility] ORS;  Service: General;  Laterality: Right;    There were no vitals filed for this visit.  Visit Diagnosis:  Left hip pain - Plan: PT plan of care cert/re-cert  Difficulty walking - Plan: PT plan of care cert/re-cert      Subjective Assessment - 10/07/14 1400    Subjective Pt reports that she has had low back pain/ hip pain that started in gluteals and runs down leg. Pt reports that she had a fall in 5/29 requiring hospitalization for preforated bowels. Patient noticed back pain while at hospital during 13 day stay. Pt states that the she has occassional pain in distal  posterior thigh and some pain in the calf. No pain at the time of eval.  Pt states that she has been experiencing deep pain in posterior pelvis with attempt to have bowel movement(instructed to let physician know if this pain persists.)   Pertinent History Bowel resection, back surgery (37years) , steriod injection 10/01/14   Limitations Standing;Sitting;Walking;House hold activities   How long can you sit comfortably? 15 minutes    How long can you stand comfortably? 10-15 minutes    How long can you walk comfortably? 40-50 ft within the house    Diagnostic tests none reported - xrays were negative for fx.    Patient Stated Goals decrease pain,    Currently in Pain? No/denies            [facility] PT Assessment - 10/07/14 0001    Assessment   Medical Diagnosis Sciatica    Onset Date/Surgical Date 10/01/14   Hand Dominance Right   Next MD Visit 11/10/2014   Prior Therapy Yes, knee replacement 2012, back surgery 1972   Precautions   Precautions None   Precaution Comments none   Balance Screen   Has the patient fallen in the past 6 months Yes   How many times? 1   Has the patient had a decrease in activity level because of a fear of falling?  Yes   Is the patient reluctant to leave  their home because of a fear of falling?  Yes   [place] Private residence   Living Arrangements Spouse/significant other   Available Help at Discharge Family   Type of [place] to enter   Entrance Stairs-Number of Steps 4   Entrance Stairs-Rails None   Home Layout Two level   Alternate Level Stairs-Number of Steps 14   Alternate Level Stairs-Rails Left   Home Equipment None   Additional Comments Masterbed room is upstairs, Pt would like to start using bedroom for sleeping again .    Prior Function   Level of Independence Independent   Vocation Full time employment  Not working currently, intends to go back to work.    Vocation Requirements sitting at  desk, interviewing clients administration of staff    Leisure go to the [employer]   Overall Cognitive Status Within Functional Limits for tasks assessed   Observation/Other Assessments   Modified Oswertry 22%.   Moderate disability   Sensation   Light Touch Appears Intact   Additional Comments no N/T noted in the L LE.    Posture/Postural Control   Posture Comments Pt presesnts with slight foward head posture and decreased lumbar lordosis.    AROM   Overall AROM  Within functional limits for tasks performed   Strength   Overall Strength Deficits   Overall Strength Comments R LE 4+/5 except hip flexion(4-/5). L LE 4-/5 throughout    Palpation   Palpation comment slight hypomobility in L2-L5, Pt tender to palpation in L piriformis. No numbness tingling noted with SLR or piriformis stretch.    Slump test   Findings Positive   Side Left   Straight Leg Raise   Findings Positive   Side  Left   Comment pain from gluteal fold into proximal calf.    Pelvic Compression   Findings Negative   Sacral thrust    Findings Negative   Piriformis Test   Findings Positive   Side  Right;Left   Comments pt reports increase in pain bilaterally     Ambulation/Gait   Gait Comments Pt ambulate with reciprocal gait pattern with slight decrease in foot clearance on L compared to right.    Standardized Balance Assessment   Five times sit to stand comments  20.39   >15 seconds indicates increased fall risk    10 Meter Walk 0.66m/s  <1.0 m/s indicates greater risk of falls.    Berg Balance Test   Sit to Stand Able to stand without using hands and stabilize independently   Standing Unsupported Able to stand safely 2 minutes   Sitting with Back Unsupported but Feet Supported on Floor or Stool Able to sit safely and securely 2 minutes   Stand to Sit Sits safely with minimal use of hands   Transfers Able to transfer safely, minor use of hands   Standing Unsupported with Eyes Closed Able to stand  10 seconds with supervision   Standing Ubsupported with Feet Together Able to place feet together independently and stand for 1 minute with supervision   From Standing, Reach Forward with Outstretched Arm Can reach confidently >25 cm (10")   From Standing Position, Pick up Object from Floor Able to pick up shoe safely and easily   From Standing Position, Turn to Look Behind Over each Shoulder Looks behind one side only/other side shows less weight shift   Turn 360 Degrees Able to turn 360  degrees safely one side only in 4 seconds or less   Standing Unsupported, Alternately Place Feet on Step/Stool Able to stand independently and complete 8 steps >20 seconds   Standing Unsupported, One Foot in Front Needs help to step but can hold 15 seconds   Standing on One Leg Able to lift leg independently and hold equal to or more than 3 seconds   Total Score 46                           PT Education - 10/07/14 1545    Education provided Yes   Education Details cause of sciatica, neural tension, plan of care   Person(s) Educated Patient   Methods Explanation;Demonstration;Tactile cues;Verbal cues   Comprehension Verbalized understanding;Returned demonstration             PT Long Term Goals - 10/07/14 1538    PT LONG TERM GOAL #1   Title Pt will be independent in HEP by 11/18/2014   Time 6   Period Weeks   Status New   PT LONG TERM GOAL #2   Title Patient will reduce Oswestry score to <15% to demosntrates minimal disability/pain with ADLs 11/18/2014    Baseline 4/10   Time 6   Period Weeks   Status New   PT LONG TERM GOAL #3   Title Pt will be able to stand for greater that 15 minutes without pain in order to increase ability to stand in line at the store by 11/18/2014   Baseline 5 minutes   Time 6   Period Weeks   Status New   PT LONG TERM GOAL #4   Title Pt will be able to walk up and down 1 flight of stairs with only minor increase in pain to allow access to bedroom in  house by 11/18/2014   Time 6   Period Weeks   Status New   PT LONG TERM GOAL #5   Title Patient will increase 10 meter walk to >1.0 m/s without AD to return to community ambulator by 11/18/14   Time 6   Period Weeks   Status New               Plan - 10/07/14 1504    Clinical Impression Statement Pt is a pleasent 74 year old female with complaints of low back/hip/posterior leg pain secondary to fall and subsequent hospitalization in May 2016. pt demonstates tenderness to palpation of L piriformis, as well as slight hypomobility in Lumbar spine. Pt tested positive for slump test, piriformis test, SLR indicating increased neural tension. Negative SI joint provocation tests. Pt demonstrates decreased strength in bilateral hip flexors and overall decreased L LE strength compared to R.  Upon PT eval, balance problems were also noted through the 5xSTS. BERG Balance scale, and 10 meter walk test; pt demonstrates significant difficulty with narrow BOS balance tasks. Pt would benefit from skilled PT in order to increase stregnth  and balance, as well as to reduce pain to improve function with ADLs.    Pt will benefit from skilled therapeutic intervention in order to improve on the following deficits Pain;Abnormal gait;Decreased activity tolerance;Decreased balance;Decreased mobility;Decreased strength;Difficulty walking;Hypomobility;Impaired flexibility   Rehab Potential Good   Clinical Impairments Affecting Rehab Potential Positive: good family support, motivated, recent onset of pain. Negative: recent hospitalization   PT Frequency 2x / week   PT Duration 6 weeks   PT Treatment/Interventions ADLs/Self Care Home Management;Cryotherapy;Electrical Stimulation;Moist Heat;Ultrasound;Gait  training;Stair training;Functional mobility training;Therapeutic activities;Therapeutic exercise;Balance training;Neuromuscular re-education;Patient/family education;Manual techniques;Passive range of motion   PT Next  Visit Plan Teach HEP for sciatic stretch and neural flossing, LE strengthening.    PT Home Exercise Plan will address next visit.    Consulted and Agree with Plan of Care Patient          G-Codes - Oct 27, 2014 1618    Functional Assessment Tool Used Berg Balance, 10 meter, 5 times sit<>stand, clinical judgement   Functional Limitation Mobility: Walking and moving around   Mobility: Walking and Moving Around Current Status [phone]) At least 20 percent but less than 40 percent impaired, limited or restricted   Mobility: Walking and Moving Around Goal Status [phone]) At least 1 percent but less than 20 percent impaired, limited or restricted       Problem List Patient Active Problem List   Diagnosis Date Noted  . Perforated bowel 10/06/2014  Barrie Folk, SPT This entire session was performed under direct supervision and direction of a licensed therapist. I have personally read, edited and approve of the note as written.   Hopkins,Margaret, PT, DPT October 27, 2014, 4:20 PM  [place] MAIN [facility] SERVICES [address] [place], [place], [address] Phone: [phone]   Fax:  [phone]

## 2014-10-21 ENCOUNTER — Encounter: Payer: Medicare Other | Admitting: Physical Therapy

## 2014-12-16 ENCOUNTER — Other Ambulatory Visit: Payer: Self-pay | Admitting: Internal Medicine

## 2014-12-16 DIAGNOSIS — Z1231 Encounter for screening mammogram for malignant neoplasm of breast: Secondary | ICD-10-CM

## 2015-01-12 ENCOUNTER — Ambulatory Visit: Payer: Medicare Other | Attending: Internal Medicine

## 2015-04-15 ENCOUNTER — Ambulatory Visit
Admission: RE | Admit: 2015-04-15 | Discharge: 2015-04-15 | Disposition: A | Discharge status: Home / Self Care | Payer: PPO | Source: Ambulatory Visit | Attending: Internal Medicine | Admitting: Internal Medicine

## 2015-04-15 DIAGNOSIS — Z1231 Encounter for screening mammogram for malignant neoplasm of breast: Secondary | ICD-10-CM | POA: Diagnosis not present

## 2015-04-20 ENCOUNTER — Other Ambulatory Visit: Payer: Self-pay | Admitting: Internal Medicine

## 2015-04-20 DIAGNOSIS — R928 Other abnormal and inconclusive findings on diagnostic imaging of breast: Secondary | ICD-10-CM

## 2015-04-21 DIAGNOSIS — D0461 Carcinoma in situ of skin of right upper limb, including shoulder: Secondary | ICD-10-CM | POA: Diagnosis not present

## 2015-04-21 DIAGNOSIS — C44622 Squamous cell carcinoma of skin of right upper limb, including shoulder: Secondary | ICD-10-CM | POA: Diagnosis not present

## 2015-05-05 ENCOUNTER — Ambulatory Visit
Admission: RE | Admit: 2015-05-05 | Discharge: 2015-05-05 | Disposition: A | Discharge status: Home / Self Care | Payer: PPO | Source: Ambulatory Visit | Attending: Internal Medicine | Admitting: Internal Medicine

## 2015-05-05 DIAGNOSIS — R928 Other abnormal and inconclusive findings on diagnostic imaging of breast: Secondary | ICD-10-CM | POA: Diagnosis not present

## 2015-05-06 DIAGNOSIS — J40 Bronchitis, not specified as acute or chronic: Secondary | ICD-10-CM | POA: Diagnosis not present

## 2015-05-07 DIAGNOSIS — H2512 Age-related nuclear cataract, left eye: Secondary | ICD-10-CM | POA: Diagnosis not present

## 2015-06-01 ENCOUNTER — Ambulatory Visit: Admit: 2015-06-01 | Payer: PPO | Admitting: Ophthalmology

## 2015-06-01 DIAGNOSIS — E782 Mixed hyperlipidemia: Secondary | ICD-10-CM | POA: Diagnosis not present

## 2015-06-01 DIAGNOSIS — Z0000 Encounter for general adult medical examination without abnormal findings: Secondary | ICD-10-CM | POA: Diagnosis not present

## 2015-06-01 SURGERY — PHACOEMULSIFICATION, CATARACT, WITH IOL INSERTION
Anesthesia: Choice | Laterality: Left

## 2015-06-15 DIAGNOSIS — I10 Essential (primary) hypertension: Secondary | ICD-10-CM | POA: Diagnosis not present

## 2015-06-15 DIAGNOSIS — J984 Other disorders of lung: Secondary | ICD-10-CM | POA: Diagnosis not present

## 2015-06-15 DIAGNOSIS — R0789 Other chest pain: Secondary | ICD-10-CM | POA: Diagnosis not present

## 2015-07-06 DIAGNOSIS — J0101 Acute recurrent maxillary sinusitis: Secondary | ICD-10-CM | POA: Diagnosis not present

## 2015-07-06 DIAGNOSIS — J4530 Mild persistent asthma, uncomplicated: Secondary | ICD-10-CM | POA: Diagnosis not present

## 2015-07-06 DIAGNOSIS — R911 Solitary pulmonary nodule: Secondary | ICD-10-CM | POA: Diagnosis not present

## 2015-07-07 ENCOUNTER — Other Ambulatory Visit: Payer: Self-pay | Admitting: Physician Assistant

## 2015-07-07 DIAGNOSIS — R911 Solitary pulmonary nodule: Secondary | ICD-10-CM

## 2015-07-15 ENCOUNTER — Ambulatory Visit: Payer: PPO

## 2015-07-23 ENCOUNTER — Ambulatory Visit
Admission: RE | Admit: 2015-07-23 | Discharge: 2015-07-23 | Disposition: A | Discharge status: Home / Self Care | Payer: PPO | Source: Ambulatory Visit | Attending: Physician Assistant | Admitting: Physician Assistant

## 2015-07-23 DIAGNOSIS — R911 Solitary pulmonary nodule: Secondary | ICD-10-CM

## 2015-08-11 DIAGNOSIS — J320 Chronic maxillary sinusitis: Secondary | ICD-10-CM | POA: Diagnosis not present

## 2015-08-11 DIAGNOSIS — R911 Solitary pulmonary nodule: Secondary | ICD-10-CM | POA: Diagnosis not present

## 2015-08-11 DIAGNOSIS — J4530 Mild persistent asthma, uncomplicated: Secondary | ICD-10-CM | POA: Diagnosis not present

## 2015-09-01 DIAGNOSIS — N3090 Cystitis, unspecified without hematuria: Secondary | ICD-10-CM | POA: Diagnosis not present

## 2015-09-01 DIAGNOSIS — R300 Dysuria: Secondary | ICD-10-CM | POA: Diagnosis not present

## 2015-09-18 DIAGNOSIS — I10 Essential (primary) hypertension: Secondary | ICD-10-CM | POA: Diagnosis not present

## 2015-09-18 DIAGNOSIS — J209 Acute bronchitis, unspecified: Secondary | ICD-10-CM | POA: Diagnosis not present

## 2015-09-22 DIAGNOSIS — N3946 Mixed incontinence: Secondary | ICD-10-CM | POA: Diagnosis not present

## 2015-09-22 DIAGNOSIS — N3010 Interstitial cystitis (chronic) without hematuria: Secondary | ICD-10-CM | POA: Diagnosis not present

## 2015-09-22 DIAGNOSIS — R339 Retention of urine, unspecified: Secondary | ICD-10-CM | POA: Diagnosis not present

## 2015-09-22 DIAGNOSIS — N3020 Other chronic cystitis without hematuria: Secondary | ICD-10-CM | POA: Diagnosis not present

## 2015-10-21 DIAGNOSIS — N3090 Cystitis, unspecified without hematuria: Secondary | ICD-10-CM | POA: Diagnosis not present

## 2015-10-21 DIAGNOSIS — K210 Gastro-esophageal reflux disease with esophagitis: Secondary | ICD-10-CM | POA: Diagnosis not present

## 2015-10-21 DIAGNOSIS — M5414 Radiculopathy, thoracic region: Secondary | ICD-10-CM | POA: Diagnosis not present

## 2015-11-13 DIAGNOSIS — M1811 Unilateral primary osteoarthritis of first carpometacarpal joint, right hand: Secondary | ICD-10-CM | POA: Diagnosis not present

## 2015-11-13 DIAGNOSIS — M25541 Pain in joints of right hand: Secondary | ICD-10-CM | POA: Diagnosis not present

## 2015-12-02 DIAGNOSIS — R3915 Urgency of urination: Secondary | ICD-10-CM | POA: Diagnosis not present

## 2015-12-02 DIAGNOSIS — R339 Retention of urine, unspecified: Secondary | ICD-10-CM | POA: Diagnosis not present

## 2015-12-02 DIAGNOSIS — R350 Frequency of micturition: Secondary | ICD-10-CM | POA: Diagnosis not present

## 2015-12-02 DIAGNOSIS — R32 Unspecified urinary incontinence: Secondary | ICD-10-CM | POA: Diagnosis not present

## 2015-12-02 DIAGNOSIS — R1032 Left lower quadrant pain: Secondary | ICD-10-CM | POA: Diagnosis not present

## 2015-12-02 DIAGNOSIS — N3020 Other chronic cystitis without hematuria: Secondary | ICD-10-CM | POA: Diagnosis not present

## 2015-12-02 DIAGNOSIS — R300 Dysuria: Secondary | ICD-10-CM | POA: Diagnosis not present

## 2015-12-18 DIAGNOSIS — J984 Other disorders of lung: Secondary | ICD-10-CM | POA: Diagnosis not present

## 2015-12-22 DIAGNOSIS — E782 Mixed hyperlipidemia: Secondary | ICD-10-CM | POA: Insufficient documentation

## 2015-12-22 DIAGNOSIS — Z23 Encounter for immunization: Secondary | ICD-10-CM | POA: Diagnosis not present

## 2015-12-22 DIAGNOSIS — Z0000 Encounter for general adult medical examination without abnormal findings: Secondary | ICD-10-CM | POA: Diagnosis not present

## 2015-12-30 DIAGNOSIS — R339 Retention of urine, unspecified: Secondary | ICD-10-CM | POA: Diagnosis not present

## 2015-12-30 DIAGNOSIS — N3020 Other chronic cystitis without hematuria: Secondary | ICD-10-CM | POA: Diagnosis not present

## 2016-01-20 DIAGNOSIS — M542 Cervicalgia: Secondary | ICD-10-CM | POA: Diagnosis not present

## 2016-02-13 DIAGNOSIS — R51 Headache: Secondary | ICD-10-CM | POA: Diagnosis not present

## 2016-02-13 DIAGNOSIS — J0110 Acute frontal sinusitis, unspecified: Secondary | ICD-10-CM | POA: Diagnosis not present

## 2016-02-24 DIAGNOSIS — H2512 Age-related nuclear cataract, left eye: Secondary | ICD-10-CM | POA: Diagnosis not present

## 2016-04-18 DIAGNOSIS — H2512 Age-related nuclear cataract, left eye: Secondary | ICD-10-CM | POA: Diagnosis not present

## 2016-04-25 DIAGNOSIS — J40 Bronchitis, not specified as acute or chronic: Secondary | ICD-10-CM | POA: Diagnosis not present

## 2016-04-25 DIAGNOSIS — J329 Chronic sinusitis, unspecified: Secondary | ICD-10-CM | POA: Diagnosis not present

## 2016-05-10 NOTE — H&P (Signed)
See scanned note.

## 2016-05-11 ENCOUNTER — Encounter
Admission: RE | Disposition: A | Discharge status: Home / Self Care | Payer: Self-pay | Source: Ambulatory Visit | Attending: Ophthalmology

## 2016-05-11 ENCOUNTER — Ambulatory Visit: Payer: PPO | Admitting: Anesthesiology

## 2016-05-11 ENCOUNTER — Ambulatory Visit
Admission: RE | Admit: 2016-05-11 | Discharge: 2016-05-11 | Disposition: A | Discharge status: Home / Self Care | Payer: PPO | Source: Ambulatory Visit | Attending: Ophthalmology | Admitting: Ophthalmology

## 2016-05-11 DIAGNOSIS — H2512 Age-related nuclear cataract, left eye: Secondary | ICD-10-CM | POA: Diagnosis not present

## 2016-05-11 DIAGNOSIS — R51 Headache: Secondary | ICD-10-CM | POA: Insufficient documentation

## 2016-05-11 DIAGNOSIS — I4891 Unspecified atrial fibrillation: Secondary | ICD-10-CM | POA: Insufficient documentation

## 2016-05-11 DIAGNOSIS — E1136 Type 2 diabetes mellitus with diabetic cataract: Secondary | ICD-10-CM | POA: Insufficient documentation

## 2016-05-11 DIAGNOSIS — I10 Essential (primary) hypertension: Secondary | ICD-10-CM | POA: Insufficient documentation

## 2016-05-11 DIAGNOSIS — Z79899 Other long term (current) drug therapy: Secondary | ICD-10-CM | POA: Insufficient documentation

## 2016-05-11 DIAGNOSIS — K219 Gastro-esophageal reflux disease without esophagitis: Secondary | ICD-10-CM | POA: Diagnosis not present

## 2016-05-11 DIAGNOSIS — E785 Hyperlipidemia, unspecified: Secondary | ICD-10-CM | POA: Diagnosis not present

## 2016-05-11 HISTORY — DX: Headache, unspecified: R51.9

## 2016-05-11 HISTORY — DX: Headache: R51

## 2016-05-11 HISTORY — DX: Gastro-esophageal reflux disease without esophagitis: K21.9

## 2016-05-11 HISTORY — DX: Other specified postprocedural states: Z98.890

## 2016-05-11 HISTORY — DX: Encounter for sterilization: T41.45XA

## 2016-05-11 HISTORY — DX: Nausea with vomiting, unspecified: R11.2

## 2016-05-11 HISTORY — PX: CATARACT EXTRACTION W/PHACO: SHX586

## 2016-05-11 HISTORY — DX: Cardiac murmur, unspecified: R01.1

## 2016-05-11 HISTORY — DX: Malignant (primary) neoplasm, unspecified: C80.1

## 2016-05-11 HISTORY — DX: Encounter for sterilization: Z30.2

## 2016-05-11 HISTORY — DX: Cardiac arrhythmia, unspecified: I49.9

## 2016-05-11 SURGERY — PHACOEMULSIFICATION, CATARACT, WITH IOL INSERTION
Anesthesia: Monitor Anesthesia Care | Site: Eye | Laterality: Left | Wound class: Clean

## 2016-05-11 MED ORDER — PROPOFOL 10 MG/ML IV BOLUS
INTRAVENOUS | Status: AC
  Filled 2016-05-11: qty 20

## 2016-05-11 MED ORDER — LIDOCAINE HCL (PF) 4 % IJ SOLN
INTRAMUSCULAR | Status: AC
  Filled 2016-05-11: qty 5

## 2016-05-11 MED ORDER — HYALURONIDASE HUMAN 150 UNIT/ML IJ SOLN
INTRAMUSCULAR | Status: AC
  Filled 2016-05-11: qty 1

## 2016-05-11 MED ORDER — NA CHONDROIT SULF-NA HYALURON 40-17 MG/ML IO SOLN
INTRAOCULAR | Status: DC | PRN
  Administered 2016-05-11: 1 mL via INTRAOCULAR

## 2016-05-11 MED ORDER — CEFUROXIME OPHTHALMIC INJECTION 1 MG/0.1 ML
INJECTION | OPHTHALMIC | Status: AC
  Filled 2016-05-11: qty 0.1

## 2016-05-11 MED ORDER — SODIUM CHLORIDE 0.9 % IV SOLN
INTRAVENOUS | Status: DC
  Administered 2016-05-11 (×2): via INTRAVENOUS

## 2016-05-11 MED ORDER — CYCLOPENTOLATE HCL 2 % OP SOLN
1.0000 [drp] | OPHTHALMIC | Status: AC | PRN
  Administered 2016-05-11 (×4): 1 [drp] via OPHTHALMIC

## 2016-05-11 MED ORDER — CEFUROXIME OPHTHALMIC INJECTION 1 MG/0.1 ML
INJECTION | OPHTHALMIC | Status: DC | PRN
  Administered 2016-05-11: 0.1 mL via INTRACAMERAL

## 2016-05-11 MED ORDER — CARBACHOL 0.01 % IO SOLN
INTRAOCULAR | Status: DC | PRN
  Administered 2016-05-11: 0.5 mL via INTRAOCULAR

## 2016-05-11 MED ORDER — PHENYLEPHRINE HCL 10 % OP SOLN
1.0000 [drp] | OPHTHALMIC | Status: AC | PRN
  Administered 2016-05-11 (×4): 1 [drp] via OPHTHALMIC

## 2016-05-11 MED ORDER — ALFENTANIL 500 MCG/ML IJ INJ
INJECTION | INTRAMUSCULAR | Status: DC | PRN
  Administered 2016-05-11: 750 ug via INTRAVENOUS

## 2016-05-11 MED ORDER — POVIDONE-IODINE 5 % OP SOLN
OPHTHALMIC | Status: AC
  Filled 2016-05-11: qty 30

## 2016-05-11 MED ORDER — MOXIFLOXACIN HCL 0.5 % OP SOLN
OPHTHALMIC | Status: DC | PRN
  Administered 2016-05-11: 1 [drp] via OPHTHALMIC

## 2016-05-11 MED ORDER — MIDAZOLAM HCL 2 MG/2ML IJ SOLN
INTRAMUSCULAR | Status: AC
  Filled 2016-05-11: qty 2

## 2016-05-11 MED ORDER — EPINEPHRINE PF 1 MG/ML IJ SOLN
INTRAMUSCULAR | Status: AC
  Filled 2016-05-11: qty 2

## 2016-05-11 MED ORDER — NA CHONDROIT SULF-NA HYALURON 40-17 MG/ML IO SOLN
INTRAOCULAR | Status: AC
  Filled 2016-05-11: qty 1

## 2016-05-11 MED ORDER — POVIDONE-IODINE 5 % OP SOLN
OPHTHALMIC | Status: DC | PRN
  Administered 2016-05-11: 1 via OPHTHALMIC

## 2016-05-11 MED ORDER — MIDAZOLAM HCL 2 MG/2ML IJ SOLN
INTRAMUSCULAR | Status: DC | PRN
  Administered 2016-05-11: 1 mg via INTRAVENOUS

## 2016-05-11 MED ORDER — MOXIFLOXACIN HCL 0.5 % OP SOLN
1.0000 [drp] | OPHTHALMIC | Status: AC | PRN
  Administered 2016-05-11 (×3): 1 [drp] via OPHTHALMIC

## 2016-05-11 MED ORDER — LIDOCAINE HCL (PF) 4 % IJ SOLN
INTRAMUSCULAR | Status: DC | PRN
  Administered 2016-05-11: 9 mL via OPHTHALMIC

## 2016-05-11 MED ORDER — BUPIVACAINE HCL (PF) 0.75 % IJ SOLN
INTRAMUSCULAR | Status: AC
  Filled 2016-05-11: qty 10

## 2016-05-11 MED ORDER — EPINEPHRINE PF 1 MG/ML IJ SOLN
INTRAOCULAR | Status: DC | PRN
  Administered 2016-05-11: 1 mL via OPHTHALMIC

## 2016-05-11 MED ORDER — TETRACAINE HCL 0.5 % OP SOLN
OPHTHALMIC | Status: AC
  Filled 2016-05-11: qty 2

## 2016-05-11 MED ORDER — TETRACAINE HCL 0.5 % OP SOLN
OPHTHALMIC | Status: DC | PRN
  Administered 2016-05-11: 1 [drp] via OPHTHALMIC

## 2016-05-11 MED ORDER — ACETYLCHOLINE CHLORIDE 20 MG IO SOLR: INTRAOCULAR | Status: DC | PRN

## 2016-05-11 MED ORDER — LIDOCAINE HCL (PF) 2 % IJ SOLN
INTRAMUSCULAR | Status: AC
  Filled 2016-05-11: qty 2

## 2016-05-11 SURGICAL SUPPLY — 29 items
CANNULA ANT/CHMB 27GA (MISCELLANEOUS) ×2 IMPLANT
CORD BIP STRL DISP 12FT (MISCELLANEOUS) ×2 IMPLANT
CUP MEDICINE 2OZ PLAST GRAD ST (MISCELLANEOUS) ×2 IMPLANT
DRAPE XRAY CASSETTE 23X24 (DRAPES) ×2 IMPLANT
ERASER HMR WETFIELD 18G (MISCELLANEOUS) ×2 IMPLANT
GLOVE BIO SURGEON STRL SZ8 (GLOVE) ×2 IMPLANT
GLOVE SURG LX 6.5 MICRO (GLOVE) ×1
GLOVE SURG LX 8.0 MICRO (GLOVE) ×1
GLOVE SURG LX STRL 6.5 MICRO (GLOVE) ×1 IMPLANT
GLOVE SURG LX STRL 8.0 MICRO (GLOVE) ×1 IMPLANT
GOWN STRL REUS W/ TWL LRG LVL3 (GOWN DISPOSABLE) ×1 IMPLANT
GOWN STRL REUS W/ TWL XL LVL3 (GOWN DISPOSABLE) ×1 IMPLANT
GOWN STRL REUS W/TWL LRG LVL3 (GOWN DISPOSABLE) ×2
GOWN STRL REUS W/TWL XL LVL3 (GOWN DISPOSABLE) ×2
LENS IOL ACRYSOF IQ 21.5 (Intraocular Lens) ×2 IMPLANT
PACK CATARACT (MISCELLANEOUS) ×2 IMPLANT
PACK CATARACT DINGLEDEIN LX (MISCELLANEOUS) ×2 IMPLANT
PACK EYE AFTER SURG (MISCELLANEOUS) ×2 IMPLANT
SHLD EYE VISITEC  UNIV (MISCELLANEOUS) ×2 IMPLANT
SOL BSS BAG (MISCELLANEOUS) ×2
SOL PREP PVP 2OZ (MISCELLANEOUS) ×2
SOLUTION BSS BAG (MISCELLANEOUS) ×1 IMPLANT
SOLUTION PREP PVP 2OZ (MISCELLANEOUS) ×1 IMPLANT
SUT SILK 5-0 (SUTURE) ×2 IMPLANT
SYR 3ML LL SCALE MARK (SYRINGE) ×2 IMPLANT
SYR 5ML LL (SYRINGE) ×2 IMPLANT
SYR TB 1ML 27GX1/2 LL (SYRINGE) ×2 IMPLANT
WATER STERILE IRR 250ML POUR (IV SOLUTION) ×2 IMPLANT
WIPE NON LINTING 3.25X3.25 (MISCELLANEOUS) ×2 IMPLANT

## 2016-05-11 NOTE — Op Note (Signed)
Date of Surgery: 05/11/2016 Date of Dictation: 05/11/2016 8:23 AM Pre-operative Diagnosis:  Nuclear Sclerotic Cataract left Eye Post-operative Diagnosis: same Procedure performed: Extra-capsular Cataract Extraction (ECCE) with placement of a posterior chamber intraocular lens (IOL) left Eye IOL:  Implant Name Type Inv. Item Serial No. Manufacturer Lot No. LRB No. Used  LENS IOL ACRYSOF IQ 21.5 - [id number] Intraocular Lens LENS IOL ACRYSOF IQ 21.5 [id number] ALCON  Left 1  IMPLANT RECORD             1   Anesthesia: 2% Lidocaine and 4% Marcaine in a 50/50 mixture with 10 unites/ml of Hylenex given as a peribulbar Anesthesiologist: Anesthesiologist: Molli Barrows, MD CRNA: Courtney Paris, CRNA Complications: none Estimated Blood Loss: less than 1 ml  Description of procedure:  The patient was given anesthesia and sedation via intravenous access. The patient was then prepped and draped in the usual fashion. A 25-gauge needle was bent for initiating the capsulorhexis. A 5-0 silk suture was placed through the conjunctiva superior and inferiorly to serve as bridle sutures. Hemostasis was obtained at the superior limbus using an eraser cautery. A partial thickness groove was made at the anterior surgical limbus with a 64 Beaver blade and this was dissected anteriorly with an Avaya. The anterior chamber was entered at 10 o'clock with a 1.0 mm paracentesis knife and through the lamellar dissection with a 2.6 mm Alcon keratome. Epi-Shugarcaine 0.5 CC [9 cc BSS Plus (Alcon), 3 cc 4% preservative-free lidocaine (Hospira) and 4 cc 1:1000 preservative-free, bisulfite-free epinephrine] was injected into the anterior chamber via the paracentesis tract. Epi-Shugarcaine 0.5 CC [9 cc BSS Plus (Alcon), 3 cc 4% preservative-free lidocaine (Hospira) and 4 cc 1:1000 preservative-free, bisulfite-free epinephrine] was injected into the anterior chamber via the paracentesis tract. DiscoVisc was injected to  replace the aqueous and a continuous tear curvilinear capsulorhexis was performed using a bent 25-gauge needle.  Balance salt on a syringe was used to perform hydro-dissection and phacoemulsification was carried out using a divide and conquer technique. Procedure(s) with comments: CATARACT EXTRACTION PHACO AND INTRAOCULAR LENS PLACEMENT (IOC) (Left) - [place] 1:25 AP% 25.4 CDE 36.38 Fluid pack lot # [id number]. Irrigation/aspiration was used to remove the residual cortex and the capsular bag was inflated with DiscoVisc. The intraocular lens was inserted into the capsular bag using a pre-loaded UltraSert Delivery System. Irrigation/aspiration was used to remove the residual DiscoVisc. The wound was inflated with balanced salt and checked for leaks. None were found. Miostat was injected via the paracentesis track and 0.1 ml of cefuroxime containing 1 mg of drug  was injected via the paracentesis track. The wound was checked for leaks again and none were found.   The bridal sutures were removed and two drops of Vigamox were placed on the eye. An eye shield was placed to protect the eye and the patient was discharged to the recovery area in good condition.   Sonu Kruckenberg MD

## 2016-05-11 NOTE — Transfer of Care (Signed)
Immediate Anesthesia Transfer of Care Note  Patient: Hannah [name]  Procedure(s) Performed: Procedure(s) with comments: CATARACT EXTRACTION PHACO AND INTRAOCULAR LENS PLACEMENT (IOC) (Left) - [place] 1:25 AP% 25.4 CDE 36.38 Fluid pack lot # [id number]  Patient Location: PACU and Short Stay  Anesthesia Type:MAC  Level of Consciousness: awake, oriented and patient cooperative  Airway & Oxygen Therapy: Patient Spontanous Breathing  Post-op Assessment: Report given to RN and Post -op Vital signs reviewed and stable  Post vital signs: Reviewed and stable  Last Vitals:  Vitals:   05/11/16 0613  BP: (!) 148/65  Pulse: 71  Resp: 18  Temp: 36.6 C    Last Pain:  Vitals:   05/11/16 0613  TempSrc: Tympanic         Complications: No apparent anesthesia complications

## 2016-05-11 NOTE — Discharge Instructions (Signed)
Eye Surgery Discharge Instructions  Expect mild scratchy sensation or mild soreness. DO NOT RUB YOUR EYE!  The day of surgery:  Minimal physical activity, but bed rest is not required  No reading, computer work, or close hand work  No bending, lifting, or straining.  May watch TV  For 24 hours:  No driving, legal decisions, or alcoholic beverages  Safety precautions  Eat anything you prefer: It is better to start with liquids, then soup then solid foods.  _____ Eye patch should be worn until postoperative exam tomorrow.  ____ Solar shield eyeglasses should be worn for comfort in the sunlight/patch while sleeping  Resume all regular medications including aspirin or Coumadin if these were discontinued prior to surgery. You may shower, bathe, shave, or wash your hair. Tylenol may be taken for mild discomfort.  Call your doctor if you experience significant pain, nausea, or vomiting, fever > 101 or other signs of infection. [phone] or [phone] Specific instructions:  Follow-up Information    Markelle Najarian, MD Follow up.   Specialty:  Ophthalmology Why:  February 1 at 11:00am Contact information: [address]   [place] [address] [phone]

## 2016-05-11 NOTE — Interval H&P Note (Signed)
History and Physical Interval Note:  05/11/2016 7:32 AM  Hannah [name]  has presented today for surgery, with the diagnosis of Nuclear Sclerotic Cataract Left Eye  The various methods of treatment have been discussed with the patient and family. After consideration of risks, benefits and other options for treatment, the patient has consented to  Procedure(s) with comments: CATARACT EXTRACTION PHACO AND INTRAOCULAR LENS PLACEMENT (IOC) (Left) - [place] AP% CDE Fluid pack lot # [id number] as a surgical intervention .  The patient's history has been reviewed, patient examined, no change in status, stable for surgery.  I have reviewed the patient's chart and labs.  Questions were answered to the patient's satisfaction.     Shavaughn Seidl

## 2016-05-11 NOTE — Anesthesia Post-op Follow-up Note (Cosign Needed)
Anesthesia QCDR form completed.        

## 2016-05-11 NOTE — Anesthesia Preprocedure Evaluation (Signed)
Anesthesia Evaluation  Patient identified by MRN, date of birth, ID band Patient awake    Reviewed: Allergy & Precautions, H&P , NPO status , Patient's Chart, lab work & pertinent test results, reviewed documented beta blocker date and time   History of Anesthesia Complications (+) PONV and history of anesthetic complications  Airway Mallampati: II  TM Distance: >3 FB Neck ROM: full    Dental no notable dental hx. (+) Poor Dentition   Pulmonary neg pulmonary ROS,    Pulmonary exam normal breath sounds clear to auscultation       Cardiovascular Exercise Tolerance: Good hypertension, negative cardio ROS Normal cardiovascular exam+ dysrhythmias Atrial Fibrillation + Valvular Problems/Murmurs  Rhythm:regular Rate:Normal     Neuro/Psych  Headaches, negative neurological ROS  negative psych ROS   GI/Hepatic negative GI ROS, Neg liver ROS, GERD  Medicated,  Endo/Other  negative endocrine ROSdiabetes  Renal/GU negative Renal ROS  negative genitourinary   Musculoskeletal   Abdominal   Peds  Hematology negative hematology ROS (+)   Anesthesia Other Findings Past Medical History: No date: anesthesia     Comment: shaking after an injection prior to cataract               surgery No date: Cancer (HCC)     Comment: 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 No date: Dysrhythmia     Comment: A-fib No date: GERD (gastroesophageal reflux disease) No date: Headache     Comment: Migraines No date: Heart murmur No date: Hyperlipidemia No date: Hypertension No date: Mitral valve prolapse No date: PONV (postoperative nausea and vomiting) Past Surgical History: No date: ABDOMINAL HYSTERECTOMY 35 years: BACK SURGERY 09/09/2014: BOWEL RESECTION     Comment: Procedure: , bladder instilation;  Surgeon:               Dia Crawford III, MD;  Location: [facility]S;                Service: General;; 09/09/2014: CARDIAC CATHETERIZATION Right  Comment: Procedure: CENTRAL LINE INSERTION;  Surgeon:               Dia Crawford III, MD;  Location: [facility] OR[facility];                Service: General;  Laterality: Right; No date: CATARACT EXTRACTION W/ INTRAOCULAR LENS IMPLANT Right No date: JOINT REPLACEMENT Right 09/09/2014: LAPAROTOMY N/A     Comment: Procedure: EXPLORATORY LAPAROTOMY, small bowel              resection;  Surgeon: Dia Crawford III, MD;                Location: [facility] ORS;  Service: General;                Laterality: N/A; No date: NASAL SINUS SURGERY BMI    Body Mass Index:  22.11 kg/m     Reproductive/Obstetrics negative OB ROS                             Anesthesia Physical Anesthesia Plan  ASA: III  Anesthesia Plan: MAC   Post-op Pain Management:    Induction:   Airway Management Planned:   Additional Equipment:   Intra-op Plan:   Post-operative Plan:   Informed Consent: I have reviewed the patients History and Physical, chart, labs and discussed the procedure including the risks, benefits and alternatives for the proposed anesthesia with the patient or authorized representative who  has indicated his/her understanding and acceptance.   Dental Advisory Given  Plan Discussed with: CRNA  Anesthesia Plan Comments:         Anesthesia Quick Evaluation

## 2016-05-12 NOTE — Anesthesia Postprocedure Evaluation (Signed)
Anesthesia Post Note  Patient: Hannah [name]  Procedure(s) Performed: Procedure(s) (LRB): CATARACT EXTRACTION PHACO AND INTRAOCULAR LENS PLACEMENT (IOC) (Left)  Patient location during evaluation: PACU Anesthesia Type: MAC Level of consciousness: awake and alert Pain management: pain level controlled Vital Signs Assessment: post-procedure vital signs reviewed and stable Respiratory status: spontaneous breathing, nonlabored ventilation, respiratory function stable and patient connected to nasal cannula oxygen Cardiovascular status: blood pressure returned to baseline and stable Postop Assessment: no signs of nausea or vomiting Anesthetic complications: no     Last Vitals:  Vitals:   05/11/16 0613 05/11/16 0827  BP: (!) 148/65 (!) 132/48  Pulse: 71 65  Resp: 18 16  Temp: 36.6 C 36.4 C    Last Pain:  Vitals:   05/11/16 1102  TempSrc: Tympanic                 Molli Barrows

## 2016-05-25 DIAGNOSIS — M1712 Unilateral primary osteoarthritis, left knee: Secondary | ICD-10-CM | POA: Diagnosis not present

## 2016-05-25 DIAGNOSIS — M25562 Pain in left knee: Secondary | ICD-10-CM | POA: Diagnosis not present

## 2016-06-03 DIAGNOSIS — M1712 Unilateral primary osteoarthritis, left knee: Secondary | ICD-10-CM | POA: Diagnosis not present

## 2016-06-03 DIAGNOSIS — M25562 Pain in left knee: Secondary | ICD-10-CM | POA: Diagnosis not present

## 2016-06-14 DIAGNOSIS — R29898 Other symptoms and signs involving the musculoskeletal system: Secondary | ICD-10-CM | POA: Diagnosis not present

## 2016-06-14 DIAGNOSIS — M25562 Pain in left knee: Secondary | ICD-10-CM | POA: Diagnosis not present

## 2016-06-14 DIAGNOSIS — M25662 Stiffness of left knee, not elsewhere classified: Secondary | ICD-10-CM | POA: Diagnosis not present

## 2016-06-14 DIAGNOSIS — M1712 Unilateral primary osteoarthritis, left knee: Secondary | ICD-10-CM | POA: Diagnosis not present

## 2016-06-16 DIAGNOSIS — M25562 Pain in left knee: Secondary | ICD-10-CM | POA: Diagnosis not present

## 2016-06-16 DIAGNOSIS — R29898 Other symptoms and signs involving the musculoskeletal system: Secondary | ICD-10-CM | POA: Diagnosis not present

## 2016-06-16 DIAGNOSIS — M1712 Unilateral primary osteoarthritis, left knee: Secondary | ICD-10-CM | POA: Diagnosis not present

## 2016-06-16 DIAGNOSIS — M25662 Stiffness of left knee, not elsewhere classified: Secondary | ICD-10-CM | POA: Diagnosis not present

## 2016-06-23 DIAGNOSIS — M1712 Unilateral primary osteoarthritis, left knee: Secondary | ICD-10-CM | POA: Diagnosis not present

## 2016-06-23 DIAGNOSIS — M25662 Stiffness of left knee, not elsewhere classified: Secondary | ICD-10-CM | POA: Diagnosis not present

## 2016-06-23 DIAGNOSIS — M25562 Pain in left knee: Secondary | ICD-10-CM | POA: Diagnosis not present

## 2016-06-23 DIAGNOSIS — R29898 Other symptoms and signs involving the musculoskeletal system: Secondary | ICD-10-CM | POA: Diagnosis not present

## 2016-06-27 DIAGNOSIS — E782 Mixed hyperlipidemia: Secondary | ICD-10-CM | POA: Diagnosis not present

## 2016-06-27 DIAGNOSIS — Z0000 Encounter for general adult medical examination without abnormal findings: Secondary | ICD-10-CM | POA: Diagnosis not present

## 2016-07-01 DIAGNOSIS — H9202 Otalgia, left ear: Secondary | ICD-10-CM | POA: Diagnosis not present

## 2016-07-01 DIAGNOSIS — J029 Acute pharyngitis, unspecified: Secondary | ICD-10-CM | POA: Diagnosis not present

## 2016-07-04 DIAGNOSIS — I471 Supraventricular tachycardia: Secondary | ICD-10-CM | POA: Diagnosis not present

## 2016-07-04 DIAGNOSIS — Z0000 Encounter for general adult medical examination without abnormal findings: Secondary | ICD-10-CM | POA: Diagnosis not present

## 2016-07-04 DIAGNOSIS — I10 Essential (primary) hypertension: Secondary | ICD-10-CM | POA: Diagnosis not present

## 2016-07-04 DIAGNOSIS — R7989 Other specified abnormal findings of blood chemistry: Secondary | ICD-10-CM | POA: Insufficient documentation

## 2016-07-04 DIAGNOSIS — E782 Mixed hyperlipidemia: Secondary | ICD-10-CM | POA: Diagnosis not present

## 2016-07-07 DIAGNOSIS — I208 Other forms of angina pectoris: Secondary | ICD-10-CM | POA: Diagnosis not present

## 2016-07-07 DIAGNOSIS — Z8669 Personal history of other diseases of the nervous system and sense organs: Secondary | ICD-10-CM | POA: Diagnosis not present

## 2016-07-07 DIAGNOSIS — J449 Chronic obstructive pulmonary disease, unspecified: Secondary | ICD-10-CM | POA: Diagnosis not present

## 2016-07-07 DIAGNOSIS — M1990 Unspecified osteoarthritis, unspecified site: Secondary | ICD-10-CM | POA: Diagnosis not present

## 2016-07-07 DIAGNOSIS — K219 Gastro-esophageal reflux disease without esophagitis: Secondary | ICD-10-CM | POA: Diagnosis not present

## 2016-07-07 DIAGNOSIS — R002 Palpitations: Secondary | ICD-10-CM | POA: Diagnosis not present

## 2016-07-07 DIAGNOSIS — E782 Mixed hyperlipidemia: Secondary | ICD-10-CM | POA: Diagnosis not present

## 2016-07-14 DIAGNOSIS — M1712 Unilateral primary osteoarthritis, left knee: Secondary | ICD-10-CM | POA: Diagnosis not present

## 2016-08-16 DIAGNOSIS — L814 Other melanin hyperpigmentation: Secondary | ICD-10-CM | POA: Diagnosis not present

## 2016-08-16 DIAGNOSIS — L821 Other seborrheic keratosis: Secondary | ICD-10-CM | POA: Diagnosis not present

## 2016-08-16 DIAGNOSIS — Z85828 Personal history of other malignant neoplasm of skin: Secondary | ICD-10-CM | POA: Diagnosis not present

## 2016-08-16 DIAGNOSIS — D229 Melanocytic nevi, unspecified: Secondary | ICD-10-CM | POA: Diagnosis not present

## 2016-08-20 DIAGNOSIS — J22 Unspecified acute lower respiratory infection: Secondary | ICD-10-CM | POA: Diagnosis not present

## 2016-08-25 ENCOUNTER — Ambulatory Visit
Admission: RE | Admit: 2016-08-25 | Discharge: 2016-08-25 | Disposition: A | Discharge status: Home / Self Care | Payer: PPO | Source: Ambulatory Visit | Attending: Internal Medicine | Admitting: Internal Medicine

## 2016-08-25 ENCOUNTER — Other Ambulatory Visit: Payer: Self-pay | Admitting: Internal Medicine

## 2016-08-25 DIAGNOSIS — J209 Acute bronchitis, unspecified: Secondary | ICD-10-CM | POA: Diagnosis not present

## 2016-08-25 DIAGNOSIS — R0602 Shortness of breath: Secondary | ICD-10-CM

## 2016-08-25 DIAGNOSIS — J4530 Mild persistent asthma, uncomplicated: Secondary | ICD-10-CM | POA: Diagnosis not present

## 2016-08-25 DIAGNOSIS — R05 Cough: Secondary | ICD-10-CM | POA: Diagnosis not present

## 2016-09-07 DIAGNOSIS — R0602 Shortness of breath: Secondary | ICD-10-CM | POA: Diagnosis not present

## 2016-09-08 DIAGNOSIS — Z01419 Encounter for gynecological examination (general) (routine) without abnormal findings: Secondary | ICD-10-CM | POA: Diagnosis not present

## 2016-09-13 DIAGNOSIS — J4530 Mild persistent asthma, uncomplicated: Secondary | ICD-10-CM | POA: Diagnosis not present

## 2016-09-13 DIAGNOSIS — R911 Solitary pulmonary nodule: Secondary | ICD-10-CM | POA: Diagnosis not present

## 2016-09-13 DIAGNOSIS — J209 Acute bronchitis, unspecified: Secondary | ICD-10-CM | POA: Diagnosis not present

## 2016-09-16 ENCOUNTER — Other Ambulatory Visit: Payer: Self-pay | Admitting: Obstetrics and Gynecology

## 2016-09-16 DIAGNOSIS — Z1231 Encounter for screening mammogram for malignant neoplasm of breast: Secondary | ICD-10-CM

## 2016-09-21 DIAGNOSIS — N3020 Other chronic cystitis without hematuria: Secondary | ICD-10-CM | POA: Diagnosis not present

## 2016-09-21 DIAGNOSIS — N3946 Mixed incontinence: Secondary | ICD-10-CM | POA: Diagnosis not present

## 2016-09-21 DIAGNOSIS — R339 Retention of urine, unspecified: Secondary | ICD-10-CM | POA: Diagnosis not present

## 2016-09-21 DIAGNOSIS — N3010 Interstitial cystitis (chronic) without hematuria: Secondary | ICD-10-CM | POA: Diagnosis not present

## 2016-11-21 DIAGNOSIS — M79672 Pain in left foot: Secondary | ICD-10-CM | POA: Diagnosis not present

## 2016-11-21 DIAGNOSIS — M79671 Pain in right foot: Secondary | ICD-10-CM | POA: Diagnosis not present

## 2016-11-24 ENCOUNTER — Other Ambulatory Visit: Payer: Self-pay | Admitting: Unknown Physician Specialty

## 2016-11-24 ENCOUNTER — Ambulatory Visit
Admission: RE | Admit: 2016-11-24 | Discharge: 2016-11-24 | Disposition: A | Discharge status: Home / Self Care | Payer: PPO | Source: Ambulatory Visit | Attending: Unknown Physician Specialty | Admitting: Unknown Physician Specialty

## 2016-11-24 DIAGNOSIS — J329 Chronic sinusitis, unspecified: Secondary | ICD-10-CM | POA: Diagnosis not present

## 2016-11-24 DIAGNOSIS — R52 Pain, unspecified: Secondary | ICD-10-CM

## 2016-11-24 DIAGNOSIS — L821 Other seborrheic keratosis: Secondary | ICD-10-CM | POA: Diagnosis not present

## 2016-11-24 DIAGNOSIS — R51 Headache: Secondary | ICD-10-CM | POA: Insufficient documentation

## 2016-11-24 DIAGNOSIS — L209 Atopic dermatitis, unspecified: Secondary | ICD-10-CM | POA: Diagnosis not present

## 2016-12-15 DIAGNOSIS — R911 Solitary pulmonary nodule: Secondary | ICD-10-CM | POA: Diagnosis not present

## 2016-12-15 DIAGNOSIS — J4530 Mild persistent asthma, uncomplicated: Secondary | ICD-10-CM | POA: Diagnosis not present

## 2016-12-15 DIAGNOSIS — R0602 Shortness of breath: Secondary | ICD-10-CM | POA: Diagnosis not present

## 2016-12-20 ENCOUNTER — Other Ambulatory Visit: Payer: PPO

## 2016-12-28 DIAGNOSIS — E782 Mixed hyperlipidemia: Secondary | ICD-10-CM | POA: Diagnosis not present

## 2016-12-28 DIAGNOSIS — Z79899 Other long term (current) drug therapy: Secondary | ICD-10-CM | POA: Diagnosis not present

## 2016-12-28 DIAGNOSIS — Z0000 Encounter for general adult medical examination without abnormal findings: Secondary | ICD-10-CM | POA: Diagnosis not present

## 2016-12-28 DIAGNOSIS — J45909 Unspecified asthma, uncomplicated: Secondary | ICD-10-CM | POA: Insufficient documentation

## 2016-12-28 DIAGNOSIS — Z23 Encounter for immunization: Secondary | ICD-10-CM | POA: Diagnosis not present

## 2017-01-03 DIAGNOSIS — N3020 Other chronic cystitis without hematuria: Secondary | ICD-10-CM | POA: Diagnosis not present

## 2017-01-03 DIAGNOSIS — R339 Retention of urine, unspecified: Secondary | ICD-10-CM | POA: Diagnosis not present

## 2017-01-03 DIAGNOSIS — R350 Frequency of micturition: Secondary | ICD-10-CM | POA: Diagnosis not present

## 2017-01-09 DIAGNOSIS — M5412 Radiculopathy, cervical region: Secondary | ICD-10-CM | POA: Diagnosis not present

## 2017-01-09 DIAGNOSIS — M5030 Other cervical disc degeneration, unspecified cervical region: Secondary | ICD-10-CM | POA: Diagnosis not present

## 2017-01-09 DIAGNOSIS — M542 Cervicalgia: Secondary | ICD-10-CM | POA: Diagnosis not present

## 2017-01-23 DIAGNOSIS — M5030 Other cervical disc degeneration, unspecified cervical region: Secondary | ICD-10-CM | POA: Diagnosis not present

## 2017-01-23 DIAGNOSIS — M5412 Radiculopathy, cervical region: Secondary | ICD-10-CM | POA: Diagnosis not present

## 2017-01-24 DIAGNOSIS — M5412 Radiculopathy, cervical region: Secondary | ICD-10-CM | POA: Diagnosis not present

## 2017-01-24 DIAGNOSIS — M6281 Muscle weakness (generalized): Secondary | ICD-10-CM | POA: Diagnosis not present

## 2017-01-27 DIAGNOSIS — M5412 Radiculopathy, cervical region: Secondary | ICD-10-CM | POA: Diagnosis not present

## 2017-01-31 DIAGNOSIS — M6281 Muscle weakness (generalized): Secondary | ICD-10-CM | POA: Diagnosis not present

## 2017-01-31 DIAGNOSIS — M5412 Radiculopathy, cervical region: Secondary | ICD-10-CM | POA: Diagnosis not present

## 2017-02-14 DIAGNOSIS — M5412 Radiculopathy, cervical region: Secondary | ICD-10-CM | POA: Diagnosis not present

## 2017-02-14 DIAGNOSIS — M6281 Muscle weakness (generalized): Secondary | ICD-10-CM | POA: Diagnosis not present

## 2017-02-16 DIAGNOSIS — M5412 Radiculopathy, cervical region: Secondary | ICD-10-CM | POA: Diagnosis not present

## 2017-02-16 DIAGNOSIS — M6281 Muscle weakness (generalized): Secondary | ICD-10-CM | POA: Diagnosis not present

## 2017-02-21 ENCOUNTER — Other Ambulatory Visit: Payer: Self-pay | Admitting: Internal Medicine

## 2017-02-21 DIAGNOSIS — Z1231 Encounter for screening mammogram for malignant neoplasm of breast: Secondary | ICD-10-CM

## 2017-02-21 DIAGNOSIS — M5412 Radiculopathy, cervical region: Secondary | ICD-10-CM | POA: Diagnosis not present

## 2017-02-23 DIAGNOSIS — M6281 Muscle weakness (generalized): Secondary | ICD-10-CM | POA: Diagnosis not present

## 2017-02-23 DIAGNOSIS — M5412 Radiculopathy, cervical region: Secondary | ICD-10-CM | POA: Diagnosis not present

## 2017-02-28 DIAGNOSIS — M5412 Radiculopathy, cervical region: Secondary | ICD-10-CM | POA: Diagnosis not present

## 2017-02-28 DIAGNOSIS — M6281 Muscle weakness (generalized): Secondary | ICD-10-CM | POA: Diagnosis not present

## 2017-03-07 DIAGNOSIS — J40 Bronchitis, not specified as acute or chronic: Secondary | ICD-10-CM | POA: Diagnosis not present

## 2017-03-16 ENCOUNTER — Ambulatory Visit
Admission: RE | Admit: 2017-03-16 | Discharge: 2017-03-16 | Disposition: A | Discharge status: Home / Self Care | Payer: PPO | Source: Ambulatory Visit | Attending: Internal Medicine | Admitting: Internal Medicine

## 2017-03-16 DIAGNOSIS — Z1231 Encounter for screening mammogram for malignant neoplasm of breast: Secondary | ICD-10-CM | POA: Diagnosis not present

## 2017-03-21 DIAGNOSIS — J029 Acute pharyngitis, unspecified: Secondary | ICD-10-CM | POA: Diagnosis not present

## 2017-04-13 ENCOUNTER — Other Ambulatory Visit: Payer: PPO

## 2017-04-26 ENCOUNTER — Encounter: Admission: RE | Payer: Self-pay | Source: Ambulatory Visit

## 2017-04-26 ENCOUNTER — Inpatient Hospital Stay: Admission: RE | Admit: 2017-04-26 | Payer: PPO | Source: Ambulatory Visit | Admitting: Orthopedic Surgery

## 2017-04-26 SURGERY — ARTHROPLASTY, KNEE, TOTAL, USING IMAGELESS COMPUTER-ASSISTED NAVIGATION
Anesthesia: Choice | Laterality: Left

## 2017-05-10 DIAGNOSIS — M5010 Cervical disc disorder with radiculopathy, unspecified cervical region: Secondary | ICD-10-CM | POA: Diagnosis not present

## 2017-06-09 DIAGNOSIS — K625 Hemorrhage of anus and rectum: Secondary | ICD-10-CM | POA: Diagnosis not present

## 2017-06-09 DIAGNOSIS — Z1211 Encounter for screening for malignant neoplasm of colon: Secondary | ICD-10-CM | POA: Diagnosis not present

## 2017-06-09 DIAGNOSIS — N8111 Cystocele, midline: Secondary | ICD-10-CM | POA: Diagnosis not present

## 2017-06-09 DIAGNOSIS — N816 Rectocele: Secondary | ICD-10-CM | POA: Diagnosis not present

## 2017-06-09 DIAGNOSIS — R102 Pelvic and perineal pain: Secondary | ICD-10-CM | POA: Diagnosis not present

## 2017-06-12 ENCOUNTER — Ambulatory Visit: Payer: Self-pay | Admitting: Internal Medicine

## 2017-06-13 DIAGNOSIS — R102 Pelvic and perineal pain: Secondary | ICD-10-CM | POA: Diagnosis not present

## 2017-06-13 DIAGNOSIS — K625 Hemorrhage of anus and rectum: Secondary | ICD-10-CM | POA: Diagnosis not present

## 2017-06-15 ENCOUNTER — Ambulatory Visit (INDEPENDENT_AMBULATORY_CARE_PROVIDER_SITE_OTHER): Payer: PPO | Admitting: Internal Medicine

## 2017-06-15 ENCOUNTER — Encounter: Payer: Self-pay | Admitting: Internal Medicine

## 2017-06-15 VITALS — BP 132/64 | HR 72 | Resp 16 | Ht 66.5 in | Wt 138.4 lb

## 2017-06-15 DIAGNOSIS — Z8742 Personal history of other diseases of the female genital tract: Secondary | ICD-10-CM | POA: Insufficient documentation

## 2017-06-15 DIAGNOSIS — J4520 Mild intermittent asthma, uncomplicated: Secondary | ICD-10-CM

## 2017-06-15 DIAGNOSIS — R102 Pelvic and perineal pain: Secondary | ICD-10-CM | POA: Insufficient documentation

## 2017-06-15 DIAGNOSIS — R911 Solitary pulmonary nodule: Secondary | ICD-10-CM

## 2017-06-15 DIAGNOSIS — R0602 Shortness of breath: Secondary | ICD-10-CM

## 2017-06-15 DIAGNOSIS — G43909 Migraine, unspecified, not intractable, without status migrainosus: Secondary | ICD-10-CM | POA: Insufficient documentation

## 2017-06-15 NOTE — Progress Notes (Signed)
[facility], [place] [address]  Pulmonary Sleep Medicine  Office Visit Note  Patient Name: Hannah [name] DOB: Nov 30, 1940 MRN [id number]  Date of Service: 06/15/2017  Complaints/HPI: She had a flare up of her breathong she states. Patient staets that she is back on her inhalers and this has helped hjer significantly. Patient states that she has no cough now. She though since she had gotten better she could stop her inhalers but now realizes that is not the case. No chest pain noted no fever noted denies having any nausea or vomiting  ROS  General: (-) fever, (-) chills, (-) night sweats, (-) weakness Skin: (-) rashes, (-) itching,. Eyes: (-) visual changes, (-) redness, (-) itching. Nose and Sinuses: (-) nasal stuffiness or itchiness, (-) postnasal drip, (-) nosebleeds, (-) sinus trouble. Mouth and Throat: (-) sore throat, (-) hoarseness. Neck: (-) swollen glands, (-) enlarged thyroid, (-) neck pain. Respiratory: - cough, (-) bloody sputum, + shortness of breath, - wheezing. Cardiovascular: - ankle swelling, (-) chest pain. Lymphatic: (-) lymph node enlargement. Neurologic: (-) numbness, (-) tingling. Psychiatric: (-) anxiety, (-) depression   Current Medication: Outpatient Encounter Medications as of 06/15/2017  Medication Sig Note  . amLODipine (NORVASC) 5 MG tablet Take 5 mg by mouth daily.   Marland Kitchen aspirin 81 MG tablet Take 81 mg by mouth daily.   . cholecalciferol (VITAMIN D) 1000 units tablet Take 1,000 Units by mouth daily.   . Cyanocobalamin (VITAMIN B12 PO) Take 1 tablet by mouth daily as needed. 04/21/2016: Does not take consistently  . metoprolol succinate (TOPROL-XL) 50 MG 24 hr tablet Take 50 mg by mouth 2 (two) times daily. Take with or immediately following a meal.   . omeprazole (PRILOSEC) 20 MG capsule Take 20 mg by mouth daily.   . simvastatin (ZOCOR) 40 MG tablet Take 40 mg by mouth daily.    No facility-administered encounter  medications on file as of 06/15/2017.     Surgical History: Past Surgical History:  Procedure Laterality Date  . ABDOMINAL HYSTERECTOMY    . BACK SURGERY  35 years  . BOWEL RESECTION  09/09/2014   Procedure: , bladder instilation;  Surgeon: Dia Crawford III, MD;  Location: [facility];  Service: General;;  . CARDIAC CATHETERIZATION Right 09/09/2014   Procedure: CENTRAL LINE INSERTION;  Surgeon: Dia Crawford III, MD;  Location: [facility];  Service: General;  Laterality: Right;  . CATARACT EXTRACTION W/ INTRAOCULAR LENS IMPLANT Right   . CATARACT EXTRACTION W/PHACO Left 05/11/2016   Procedure: CATARACT EXTRACTION PHACO AND INTRAOCULAR LENS PLACEMENT (IOC);  Surgeon: Estill Cotta, MD;  Location: [facility];  Service: Ophthalmology;  Laterality: Left;  [place] 1:25 AP% 25.4 CDE 36.38 Fluid pack lot # [id number]  . JOINT REPLACEMENT Right   . LAPAROTOMY N/A 09/09/2014   Procedure: EXPLORATORY LAPAROTOMY, small bowel resection;  Surgeon: Dia Crawford III, MD;  Location: [facility] OR[facility];  Service: General;  Laterality: N/A;  . NASAL SINUS SURGERY      Medical History: Past Medical History:  Diagnosis Date  . anesthesia    shaking after an injection prior to cataract surgery  . 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 Dysrhythmia    A-fib  . GERD (gastroesophageal reflux disease)   . Headache    Migraines  . Heart murmur   . Hyperlipidemia   . Hypertension   . Mitral valve prolapse   . PONV (postoperative nausea and vomiting)  Family History: Family History  Problem Relation Age of Onset  . Heart disease Mother   . Hypertension Mother   . Breast cancer Paternal Aunt 54       2 aunts  . Breast cancer Paternal Grandmother 27    Social History: Social History   Socioeconomic History  . Marital status: Married    Spouse name: Not on file  . Number of children: Not on file  . Years of education: Not on file  . Highest education level: Not on file  Social Needs  . Financial  resource strain: Not on file  . Food insecurity - worry: Not on file  . Food insecurity - inability: Not on file  . Transportation needs - medical: Not on file  . Transportation needs - non-medical: Not on file  Occupational History  . Not on file  Tobacco Use  . Smoking status: Never Smoker  . Smokeless tobacco: Never Used  Substance and Sexual Activity  . Alcohol use: No  . Drug use: No  . Sexual activity: Not on file  Other Topics Concern  . Not on file  Social History Narrative  . Not on file    Vital Signs: Blood pressure 132/64, pulse 72, resp. rate 16, height 5' 6.5" (1.689 m), weight 138 lb 6.4 oz (62.8 kg), SpO2 97 %.  Examination: General Appearance: The patient is well-developed, well-nourished, and in no distress. Skin: Gross inspection of skin unremarkable. Head: normocephalic, no gross deformities. Eyes: no gross deformities noted. ENT: ears appear grossly normal no exudates. Neck: Supple. No thyromegaly. No LAD. Respiratory: No rhonchi noted at this time. Cardiovascular: Normal S1 and S2 without murmur or rub. Extremities: No cyanosis. pulses are equal. Neurologic: Alert and oriented. No involuntary movements.  LABS: No results found for this or any previous visit (from the past 2160 hour(s)).  Radiology: Mm Screening Breast Tomo Bilateral  Result Date: 03/16/2017 CLINICAL DATA:  Screening. EXAM: 2D DIGITAL SCREENING BILATERAL MAMMOGRAM WITH CAD AND ADJUNCT TOMO COMPARISON:  Previous exam(s). ACR Breast Density Category b: There are scattered areas of fibroglandular density. FINDINGS: There are no findings suspicious for malignancy. Images were processed with CAD. IMPRESSION: No mammographic evidence of malignancy. A result letter of this screening mammogram will be mailed directly to the patient. RECOMMENDATION: Screening mammogram in one year. (Code:SM-B-01Y) BI-RADS CATEGORY  1: Negative. Electronically Signed   By: Marin Olp M.D.   On: 03/16/2017 17:08     No results found.  No results found.    Assessment and Plan: Patient Active Problem List   Diagnosis Date Noted  . Migraine 06/15/2017  . Pelvic pain in female 06/15/2017  . Personal history of ovarian cyst 06/15/2017  . Reactive airway disease 12/28/2016  . Low serum vitamin D 07/04/2016  . Medicare annual wellness visit, initial 07/04/2016  . 'light-for-dates' infant with signs of fetal malnutrition 12/30/2015  . Hyperlipidemia, mixed 12/22/2015  . Perforated bowel ([place]) 10/06/2014  . Abdominal pain, acute, left lower quadrant 08/05/2014  . Hematochezia 08/05/2014  . Atrial tachycardia ([place]) 03/27/2014  . Benign essential hypertension 08/30/2013  . Ovarian cyst, bilateral 12/17/2012  . Right lower quadrant pain 12/17/2012  . Chronic cystitis 05/15/2012  . Incomplete emptying of bladder 05/15/2012  . Microscopic hematuria 05/15/2012  . Mixed urge and stress incontinence 05/15/2012  . Symptoms involving urinary system 05/15/2012    1. COPD/Asthma 2. SOB 3. Pulmonary nodules  Likely has more of an asthma picture with flare up. Patient does better with  her inahelrs and needs to continue with the inhalers as noted above. She states she will be compliant with the inhalers now  General Counseling: I have discussed the findings of the evaluation and examination with [facility].  I have also discussed any further diagnostic evaluation thatmay be needed or ordered today. Tirsa verbalizes understanding of the findings of todays visit. We also reviewed her medications today and discussed drug interactions and side effects including but not limited excessive drowsiness and altered mental states. We also discussed that there is always a risk not just to her but also people around her. she has been encouraged to call the office with any questions or concerns that should arise related to todays visit.    Time spent: 41min  I have personally obtained a history, examined the  patient, evaluated laboratory and imaging results, formulated the assessment and plan and placed orders.    Allyne Gee, MD [facility] Pulmonary and Critical Care Sleep medicine

## 2017-06-15 NOTE — Patient Instructions (Signed)

## 2017-06-20 DIAGNOSIS — Z79899 Other long term (current) drug therapy: Secondary | ICD-10-CM | POA: Diagnosis not present

## 2017-06-20 DIAGNOSIS — E782 Mixed hyperlipidemia: Secondary | ICD-10-CM | POA: Diagnosis not present

## 2017-06-27 DIAGNOSIS — E782 Mixed hyperlipidemia: Secondary | ICD-10-CM | POA: Diagnosis not present

## 2017-06-27 DIAGNOSIS — I471 Supraventricular tachycardia: Secondary | ICD-10-CM | POA: Diagnosis not present

## 2017-06-27 DIAGNOSIS — E538 Deficiency of other specified B group vitamins: Secondary | ICD-10-CM | POA: Diagnosis not present

## 2017-06-27 DIAGNOSIS — Z0000 Encounter for general adult medical examination without abnormal findings: Secondary | ICD-10-CM | POA: Diagnosis not present

## 2017-06-27 DIAGNOSIS — Z79899 Other long term (current) drug therapy: Secondary | ICD-10-CM | POA: Diagnosis not present

## 2017-07-07 DIAGNOSIS — K5790 Diverticulosis of intestine, part unspecified, without perforation or abscess without bleeding: Secondary | ICD-10-CM | POA: Diagnosis not present

## 2017-07-07 DIAGNOSIS — R102 Pelvic and perineal pain: Secondary | ICD-10-CM | POA: Diagnosis not present

## 2017-07-07 DIAGNOSIS — Z87440 Personal history of urinary (tract) infections: Secondary | ICD-10-CM | POA: Diagnosis not present

## 2017-07-07 DIAGNOSIS — N3946 Mixed incontinence: Secondary | ICD-10-CM | POA: Diagnosis not present

## 2017-07-07 DIAGNOSIS — E7800 Pure hypercholesterolemia, unspecified: Secondary | ICD-10-CM | POA: Diagnosis not present

## 2017-07-07 DIAGNOSIS — Z885 Allergy status to narcotic agent status: Secondary | ICD-10-CM | POA: Diagnosis not present

## 2017-07-07 DIAGNOSIS — N3010 Interstitial cystitis (chronic) without hematuria: Secondary | ICD-10-CM | POA: Diagnosis not present

## 2017-07-07 DIAGNOSIS — Z90710 Acquired absence of both cervix and uterus: Secondary | ICD-10-CM | POA: Diagnosis not present

## 2017-07-07 DIAGNOSIS — R3129 Other microscopic hematuria: Secondary | ICD-10-CM | POA: Diagnosis not present

## 2017-07-07 DIAGNOSIS — Z8742 Personal history of other diseases of the female genital tract: Secondary | ICD-10-CM | POA: Diagnosis not present

## 2017-07-07 DIAGNOSIS — Z79899 Other long term (current) drug therapy: Secondary | ICD-10-CM | POA: Diagnosis not present

## 2017-07-07 DIAGNOSIS — R339 Retention of urine, unspecified: Secondary | ICD-10-CM | POA: Diagnosis not present

## 2017-07-07 DIAGNOSIS — I10 Essential (primary) hypertension: Secondary | ICD-10-CM | POA: Diagnosis not present

## 2017-07-07 DIAGNOSIS — Z7982 Long term (current) use of aspirin: Secondary | ICD-10-CM | POA: Diagnosis not present

## 2017-07-07 DIAGNOSIS — Z85828 Personal history of other malignant neoplasm of skin: Secondary | ICD-10-CM | POA: Diagnosis not present

## 2017-07-07 DIAGNOSIS — Z7951 Long term (current) use of inhaled steroids: Secondary | ICD-10-CM | POA: Diagnosis not present

## 2017-07-07 DIAGNOSIS — N3020 Other chronic cystitis without hematuria: Secondary | ICD-10-CM | POA: Diagnosis not present

## 2017-07-27 DIAGNOSIS — R0609 Other forms of dyspnea: Secondary | ICD-10-CM | POA: Diagnosis not present

## 2017-07-27 DIAGNOSIS — J4520 Mild intermittent asthma, uncomplicated: Secondary | ICD-10-CM | POA: Diagnosis not present

## 2017-07-27 DIAGNOSIS — N3090 Cystitis, unspecified without hematuria: Secondary | ICD-10-CM | POA: Diagnosis not present

## 2017-08-07 DIAGNOSIS — R102 Pelvic and perineal pain: Secondary | ICD-10-CM | POA: Diagnosis not present

## 2017-08-07 DIAGNOSIS — Z8742 Personal history of other diseases of the female genital tract: Secondary | ICD-10-CM | POA: Diagnosis not present

## 2017-08-24 DIAGNOSIS — N3010 Interstitial cystitis (chronic) without hematuria: Secondary | ICD-10-CM | POA: Diagnosis not present

## 2017-08-24 DIAGNOSIS — R350 Frequency of micturition: Secondary | ICD-10-CM | POA: Diagnosis not present

## 2017-08-29 DIAGNOSIS — M1712 Unilateral primary osteoarthritis, left knee: Secondary | ICD-10-CM | POA: Diagnosis not present

## 2017-08-29 DIAGNOSIS — S8390XA Sprain of unspecified site of unspecified knee, initial encounter: Secondary | ICD-10-CM | POA: Diagnosis not present

## 2017-08-31 DIAGNOSIS — Z1211 Encounter for screening for malignant neoplasm of colon: Secondary | ICD-10-CM | POA: Diagnosis not present

## 2017-10-03 DIAGNOSIS — J019 Acute sinusitis, unspecified: Secondary | ICD-10-CM | POA: Diagnosis not present

## 2017-11-15 DIAGNOSIS — Z961 Presence of intraocular lens: Secondary | ICD-10-CM | POA: Diagnosis not present

## 2017-11-21 DIAGNOSIS — Z0000 Encounter for general adult medical examination without abnormal findings: Secondary | ICD-10-CM | POA: Diagnosis not present

## 2017-12-04 DIAGNOSIS — E7800 Pure hypercholesterolemia, unspecified: Secondary | ICD-10-CM | POA: Diagnosis not present

## 2017-12-04 DIAGNOSIS — Q6211 Congenital occlusion of ureteropelvic junction: Secondary | ICD-10-CM | POA: Diagnosis not present

## 2017-12-04 DIAGNOSIS — M545 Low back pain: Secondary | ICD-10-CM | POA: Diagnosis not present

## 2017-12-04 DIAGNOSIS — Z8249 Family history of ischemic heart disease and other diseases of the circulatory system: Secondary | ICD-10-CM | POA: Diagnosis not present

## 2017-12-04 DIAGNOSIS — I10 Essential (primary) hypertension: Secondary | ICD-10-CM | POA: Diagnosis not present

## 2017-12-04 DIAGNOSIS — I4891 Unspecified atrial fibrillation: Secondary | ICD-10-CM | POA: Diagnosis not present

## 2017-12-04 DIAGNOSIS — Z885 Allergy status to narcotic agent status: Secondary | ICD-10-CM | POA: Diagnosis not present

## 2017-12-04 DIAGNOSIS — Z833 Family history of diabetes mellitus: Secondary | ICD-10-CM | POA: Diagnosis not present

## 2017-12-04 DIAGNOSIS — R399 Unspecified symptoms and signs involving the genitourinary system: Secondary | ICD-10-CM | POA: Diagnosis not present

## 2017-12-04 DIAGNOSIS — K5909 Other constipation: Secondary | ICD-10-CM | POA: Diagnosis not present

## 2017-12-04 DIAGNOSIS — Z823 Family history of stroke: Secondary | ICD-10-CM | POA: Diagnosis not present

## 2017-12-04 DIAGNOSIS — Z96651 Presence of right artificial knee joint: Secondary | ICD-10-CM | POA: Diagnosis not present

## 2017-12-07 DIAGNOSIS — Q6211 Congenital occlusion of ureteropelvic junction: Secondary | ICD-10-CM | POA: Diagnosis not present

## 2017-12-07 DIAGNOSIS — R93421 Abnormal radiologic findings on diagnostic imaging of right kidney: Secondary | ICD-10-CM | POA: Diagnosis not present

## 2017-12-14 ENCOUNTER — Encounter: Payer: Self-pay | Admitting: Internal Medicine

## 2017-12-14 ENCOUNTER — Ambulatory Visit (INDEPENDENT_AMBULATORY_CARE_PROVIDER_SITE_OTHER): Payer: PPO | Admitting: Internal Medicine

## 2017-12-14 VITALS — BP 132/58 | HR 66 | Resp 16 | Ht 66.5 in | Wt 136.0 lb

## 2017-12-14 DIAGNOSIS — R0602 Shortness of breath: Secondary | ICD-10-CM | POA: Diagnosis not present

## 2017-12-14 DIAGNOSIS — J449 Chronic obstructive pulmonary disease, unspecified: Secondary | ICD-10-CM

## 2017-12-14 DIAGNOSIS — J410 Simple chronic bronchitis: Secondary | ICD-10-CM

## 2017-12-14 MED ORDER — ALBUTEROL SULFATE HFA 108 (90 BASE) MCG/ACT IN AERS: 2.0000 | INHALATION_SPRAY | Freq: Four times a day (QID) | RESPIRATORY_TRACT | 2 refills | Status: DC | PRN

## 2017-12-14 MED ORDER — FLUTICASONE-SALMETEROL 232-14 MCG/ACT IN AEPB: 1.0000 | INHALATION_SPRAY | Freq: Two times a day (BID) | RESPIRATORY_TRACT | 3 refills | Status: DC

## 2017-12-14 NOTE — Patient Instructions (Signed)

## 2017-12-14 NOTE — Progress Notes (Signed)
[facility], [place] [address]  Pulmonary Sleep Medicine   Office Visit Note  Patient Name: Hannah [name] DOB: 1940-10-17 MRN [id number]  Date of Service: 12/14/2017  Complaints/HPI: Pt here for yearly follow up on chronic bronchitis.  She generally has been doing well.  She is using her albuterol and Advair inhalers as prescribed. Her last PFT's were in may 2018. She denies fatigue, hemoptysis, and SOB.  She denies wheezing or other breathing difficulty. Her inhalers help her when she has SOB.  She denies chest pain, palpitations or syncope. She does reports some sinus pain in pressure on her left side.  She states it feels like a toothache in her face, even though she has dentures.   ROS  General: (-) fever, (-) chills, (-) night sweats, (-) weakness Skin: (-) rashes, (-) itching,. Eyes: (-) visual changes, (-) redness, (-) itching. Nose and Sinuses: (-) nasal stuffiness or itchiness, (-) postnasal drip, (-) nosebleeds, (-) sinus trouble. Mouth and Throat: (-) sore throat, (-) hoarseness. Neck: (-) swollen glands, (-) enlarged thyroid, (-) neck pain. Respiratory: - cough, (-) bloody sputum, + shortness of breath, - wheezing. Cardiovascular: - ankle swelling, (-) chest pain. Lymphatic: (-) lymph node enlargement. Neurologic: (-) numbness, (-) tingling. Psychiatric: (-) anxiety, (-) depression   Current Medication: Outpatient Encounter Medications as of 12/14/2017  Medication Sig Note  . albuterol (PROAIR HFA) 108 (90 Base) MCG/ACT inhaler ProAir HFA 90 mcg/actuation aerosol inhaler  TAKE 2 PUFFS BY MOUTH EVERY 6 HOURS AS NEEDED FOR SHORTNESS OF BREATH   . amLODipine (NORVASC) 5 MG tablet Take 5 mg by mouth daily.   Marland Kitchen aspirin 81 MG tablet Take 81 mg by mouth daily.   . cholecalciferol (VITAMIN D) 1000 units tablet Take 1,000 Units by mouth daily.   . Cyanocobalamin (VITAMIN B12 PO) Take 1 tablet by mouth daily as needed. 04/21/2016: Does not  take consistently  . metoprolol succinate (TOPROL-XL) 50 MG 24 hr tablet Take 50 mg by mouth 2 (two) times daily. Take with or immediately following a meal.   . omeprazole (PRILOSEC) 20 MG capsule Take 20 mg by mouth daily.   . simvastatin (ZOCOR) 40 MG tablet Take 40 mg by mouth daily.    No facility-administered encounter medications on file as of 12/14/2017.     Surgical History: Past Surgical History:  Procedure Laterality Date  . ABDOMINAL HYSTERECTOMY    . BACK SURGERY  35 years  . BOWEL RESECTION  09/09/2014   Procedure: , bladder instilation;  Surgeon: Dia Crawford III, MD;  Location: [facility] ORS;  Service: General;;  . CARDIAC CATHETERIZATION Right 09/09/2014   Procedure: CENTRAL LINE INSERTION;  Surgeon: Dia Crawford III, MD;  Location: [facility] OR[facility];  Service: General;  Laterality: Right;  . CATARACT EXTRACTION W/ INTRAOCULAR LENS IMPLANT Right   . CATARACT EXTRACTION W/PHACO Left 05/11/2016   Procedure: CATARACT EXTRACTION PHACO AND INTRAOCULAR LENS PLACEMENT (IOC);  Surgeon: Estill Cotta, MD;  Location: [facility] OR[facility];  Service: Ophthalmology;  Laterality: Left;  [place] 1:25 AP% 25.4 CDE 36.38 Fluid pack lot # [id number]  . JOINT REPLACEMENT Right   . LAPAROTOMY N/A 09/09/2014   Procedure: EXPLORATORY LAPAROTOMY, small bowel resection;  Surgeon: Dia Crawford III, MD;  Location: [facility] ORS;  Service: General;  Laterality: N/A;  . NASAL SINUS SURGERY      Medical History: Past Medical History:  Diagnosis Date  . anesthesia    shaking after an injection prior to cataract surgery  .  Asthma   . 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 Dysrhythmia    A-fib  . GERD (gastroesophageal reflux disease)   . Headache    Migraines  . Heart murmur   . Hyperlipidemia   . Hypertension   . Mitral valve prolapse   . PONV (postoperative nausea and vomiting)     Family History: Family History  Problem Relation Age of Onset  . Heart disease Mother   . Hypertension Mother   . Breast cancer  Paternal Aunt 1       2 aunts  . Breast cancer Paternal Grandmother 61    Social History: Social History   Socioeconomic History  . Marital status: Married    Spouse name: Not on file  . Number of children: Not on file  . Years of education: Not on file  . Highest education level: Not on file  Occupational History  . Not on file  Social Needs  . Financial resource strain: Not on file  . Food insecurity:    Worry: Not on file    Inability: Not on file  . Transportation needs:    Medical: Not on file    Non-medical: Not on file  Tobacco Use  . Smoking status: Never Smoker  . Smokeless tobacco: Never Used  Substance and Sexual Activity  . Alcohol use: No  . Drug use: No  . Sexual activity: Not on file  Lifestyle  . Physical activity:    Days per week: Not on file    Minutes per session: Not on file  . Stress: Not on file  Relationships  . Social connections:    Talks on phone: Not on file    Gets together: Not on file    Attends religious service: Not on file    Active member of club or organization: Not on file    Attends meetings of clubs or organizations: Not on file    Relationship status: Not on file  . Intimate partner violence:    Fear of current or ex partner: Not on file    Emotionally abused: Not on file    Physically abused: Not on file    Forced sexual activity: Not on file  Other Topics Concern  . Not on file  Social History Narrative  . Not on file    Vital Signs: Blood pressure (!) 132/58, pulse 66, resp. rate 16, height 5' 6.5" (1.689 m), weight 136 lb (61.7 kg), SpO2 94 %.  Examination: General Appearance: The patient is well-developed, well-nourished, and in no distress. Skin: Gross inspection of skin unremarkable. Head: normocephalic, no gross deformities. Eyes: no gross deformities noted. ENT: ears appear grossly normal no exudates. Neck: Supple. No thyromegaly. No LAD. Respiratory: Clear to auscultation bilaterally. Cardiovascular:  Normal S1 and S2 without murmur or rub. Extremities: No cyanosis. pulses are equal. Neurologic: Alert and oriented. No involuntary movements.  LABS: No results found for this or any previous visit (from the past 2160 hour(s)).  Radiology: Mm Screening Breast Tomo Bilateral  Result Date: 03/16/2017 CLINICAL DATA:  Screening. EXAM: 2D DIGITAL SCREENING BILATERAL MAMMOGRAM WITH CAD AND ADJUNCT TOMO COMPARISON:  Previous exam(s). ACR Breast Density Category b: There are scattered areas of fibroglandular density. FINDINGS: There are no findings suspicious for malignancy. Images were processed with CAD. IMPRESSION: No mammographic evidence of malignancy. A result letter of this screening mammogram will be mailed directly to the patient. RECOMMENDATION: Screening mammogram in one year. (Code:SM-B-01Y) BI-RADS  CATEGORY  1: Negative. Electronically Signed   By: Marin Olp M.D.   On: 03/16/2017 17:08    No results found.  No results found.    Assessment and Plan: Patient Active Problem List   Diagnosis Date Noted  . Migraine 06/15/2017  . Pelvic pain in female 06/15/2017  . Personal history of ovarian cyst 06/15/2017  . Reactive airway disease 12/28/2016  . Low serum vitamin D 07/04/2016  . Medicare annual wellness visit, initial 07/04/2016  . 'light-for-dates' infant with signs of fetal malnutrition 12/30/2015  . Hyperlipidemia, mixed 12/22/2015  . Perforated bowel ([place]) 10/06/2014  . Abdominal pain, acute, left lower quadrant 08/05/2014  . Hematochezia 08/05/2014  . Atrial tachycardia ([place]) 03/27/2014  . Benign essential hypertension 08/30/2013  . Ovarian cyst, bilateral 12/17/2012  . Right lower quadrant pain 12/17/2012  . Chronic cystitis 05/15/2012  . Incomplete emptying of bladder 05/15/2012  . Microscopic hematuria 05/15/2012  . Mixed urge and stress incontinence 05/15/2012  . Symptoms involving urinary system 05/15/2012   1. Chronic obstructive pulmonary disease,  unspecified COPD type ([place]) Continue using inhalers as directed. - albuterol (PROAIR HFA) 108 (90 Base) MCG/ACT inhaler; Inhale 2 puffs into the lungs every 6 (six) hours as needed for wheezing or shortness of breath.  Dispense: 2 Inhaler; Refill: 2 - Fluticasone-Salmeterol 232-14 MCG/ACT AEPB; Inhale 1 puff into the lungs 2 (two) times daily.  Dispense: 1 each; Refill: 3  2. Simple chronic bronchitis (HCC) Continue using albuterol as prescribed.   3. SOB (shortness of breath) - Spirometry with Graph   General Counseling: I have discussed the findings of the evaluation and examination with [facility].  I have also discussed any further diagnostic evaluation thatmay be needed or ordered today. Evanell verbalizes understanding of the findings of todays visit. We also reviewed her medications today and discussed drug interactions and side effects including but not limited excessive drowsiness and altered mental states. We also discussed that there is always a risk not just to her but also people around her. she has been encouraged to call the office with any questions or concerns that should arise related to todays visit.    Time spent: 25 This patient was seen by Orson Gear AGNP-C in Collaboration with Dr. Devona Konig as a part of collaborative care agreement.   I have personally obtained a history, examined the patient, evaluated laboratory and imaging results, formulated the assessment and plan and placed orders.    Allyne Gee, MD [facility] Pulmonary and Critical Care Sleep medicine

## 2017-12-25 DIAGNOSIS — R1032 Left lower quadrant pain: Secondary | ICD-10-CM | POA: Diagnosis not present

## 2017-12-25 DIAGNOSIS — E538 Deficiency of other specified B group vitamins: Secondary | ICD-10-CM | POA: Diagnosis not present

## 2017-12-25 DIAGNOSIS — Q6211 Congenital occlusion of ureteropelvic junction: Secondary | ICD-10-CM | POA: Diagnosis not present

## 2017-12-25 DIAGNOSIS — E782 Mixed hyperlipidemia: Secondary | ICD-10-CM | POA: Diagnosis not present

## 2017-12-25 DIAGNOSIS — R1031 Right lower quadrant pain: Secondary | ICD-10-CM | POA: Diagnosis not present

## 2017-12-25 DIAGNOSIS — R102 Pelvic and perineal pain: Secondary | ICD-10-CM | POA: Diagnosis not present

## 2017-12-25 DIAGNOSIS — Z79899 Other long term (current) drug therapy: Secondary | ICD-10-CM | POA: Diagnosis not present

## 2017-12-28 DIAGNOSIS — J431 Panlobular emphysema: Secondary | ICD-10-CM | POA: Diagnosis not present

## 2017-12-28 DIAGNOSIS — Z0000 Encounter for general adult medical examination without abnormal findings: Secondary | ICD-10-CM | POA: Diagnosis not present

## 2017-12-28 DIAGNOSIS — E538 Deficiency of other specified B group vitamins: Secondary | ICD-10-CM | POA: Diagnosis not present

## 2017-12-28 DIAGNOSIS — N3090 Cystitis, unspecified without hematuria: Secondary | ICD-10-CM | POA: Diagnosis not present

## 2017-12-28 DIAGNOSIS — M5116 Intervertebral disc disorders with radiculopathy, lumbar region: Secondary | ICD-10-CM | POA: Diagnosis not present

## 2018-01-12 DIAGNOSIS — R1032 Left lower quadrant pain: Secondary | ICD-10-CM | POA: Diagnosis not present

## 2018-01-12 DIAGNOSIS — I471 Supraventricular tachycardia: Secondary | ICD-10-CM | POA: Diagnosis not present

## 2018-01-12 DIAGNOSIS — E538 Deficiency of other specified B group vitamins: Secondary | ICD-10-CM | POA: Diagnosis not present

## 2018-01-12 DIAGNOSIS — Z23 Encounter for immunization: Secondary | ICD-10-CM | POA: Diagnosis not present

## 2018-03-02 DIAGNOSIS — R05 Cough: Secondary | ICD-10-CM | POA: Diagnosis not present

## 2018-03-02 DIAGNOSIS — R21 Rash and other nonspecific skin eruption: Secondary | ICD-10-CM | POA: Diagnosis not present

## 2018-03-02 DIAGNOSIS — J069 Acute upper respiratory infection, unspecified: Secondary | ICD-10-CM | POA: Diagnosis not present

## 2018-03-19 ENCOUNTER — Other Ambulatory Visit: Payer: Self-pay | Admitting: Internal Medicine

## 2018-03-19 DIAGNOSIS — Z1231 Encounter for screening mammogram for malignant neoplasm of breast: Secondary | ICD-10-CM

## 2018-03-22 ENCOUNTER — Ambulatory Visit
Admission: RE | Admit: 2018-03-22 | Discharge: 2018-03-22 | Disposition: A | Discharge status: Home / Self Care | Payer: PPO | Source: Ambulatory Visit | Attending: Internal Medicine | Admitting: Internal Medicine

## 2018-03-22 DIAGNOSIS — Z1231 Encounter for screening mammogram for malignant neoplasm of breast: Secondary | ICD-10-CM | POA: Diagnosis not present

## 2018-05-09 DIAGNOSIS — R300 Dysuria: Secondary | ICD-10-CM | POA: Diagnosis not present

## 2018-05-09 DIAGNOSIS — M5416 Radiculopathy, lumbar region: Secondary | ICD-10-CM | POA: Diagnosis not present

## 2018-05-21 DIAGNOSIS — R002 Palpitations: Secondary | ICD-10-CM | POA: Diagnosis not present

## 2018-05-21 DIAGNOSIS — R000 Tachycardia, unspecified: Secondary | ICD-10-CM | POA: Diagnosis not present

## 2018-05-21 DIAGNOSIS — E782 Mixed hyperlipidemia: Secondary | ICD-10-CM | POA: Diagnosis not present

## 2018-05-21 DIAGNOSIS — I471 Supraventricular tachycardia: Secondary | ICD-10-CM | POA: Diagnosis not present

## 2018-05-21 DIAGNOSIS — I10 Essential (primary) hypertension: Secondary | ICD-10-CM | POA: Diagnosis not present

## 2018-05-21 DIAGNOSIS — J449 Chronic obstructive pulmonary disease, unspecified: Secondary | ICD-10-CM | POA: Diagnosis not present

## 2018-05-21 DIAGNOSIS — K219 Gastro-esophageal reflux disease without esophagitis: Secondary | ICD-10-CM | POA: Diagnosis not present

## 2018-05-31 DIAGNOSIS — I471 Supraventricular tachycardia: Secondary | ICD-10-CM | POA: Diagnosis not present

## 2018-05-31 DIAGNOSIS — I10 Essential (primary) hypertension: Secondary | ICD-10-CM | POA: Diagnosis not present

## 2018-05-31 DIAGNOSIS — R0789 Other chest pain: Secondary | ICD-10-CM | POA: Diagnosis not present

## 2018-06-14 ENCOUNTER — Ambulatory Visit: Payer: PPO | Admitting: Internal Medicine

## 2018-06-14 ENCOUNTER — Encounter: Payer: Self-pay | Admitting: Internal Medicine

## 2018-06-14 VITALS — BP 122/80 | HR 92 | Resp 16 | Ht 66.0 in | Wt 132.0 lb

## 2018-06-14 DIAGNOSIS — J410 Simple chronic bronchitis: Secondary | ICD-10-CM | POA: Diagnosis not present

## 2018-06-14 DIAGNOSIS — R0602 Shortness of breath: Secondary | ICD-10-CM | POA: Diagnosis not present

## 2018-06-14 DIAGNOSIS — J0100 Acute maxillary sinusitis, unspecified: Secondary | ICD-10-CM | POA: Diagnosis not present

## 2018-06-14 DIAGNOSIS — J449 Chronic obstructive pulmonary disease, unspecified: Secondary | ICD-10-CM

## 2018-06-14 MED ORDER — AMOXICILLIN-POT CLAVULANATE 875-125 MG PO TABS: 1.0000 | ORAL_TABLET | Freq: Two times a day (BID) | ORAL | 0 refills | Status: DC

## 2018-06-14 MED ORDER — FLUTICASONE PROPIONATE 50 MCG/ACT NA SUSP: 2.0000 | Freq: Every day | NASAL | 6 refills | Status: DC

## 2018-06-14 NOTE — Progress Notes (Signed)
[facility] [place], [place] [address]  Pulmonary Sleep Medicine   Office Visit Note  Patient Name: Hannah [name] DOB: 20-Apr-1940 MRN [id number]  Date of Service: 06/14/2018  Complaints/HPI: Pt is here for follow up on copd, chronic bronchitis.  She reports she has been doing well.  Denies using her inhalers very much.  She denies any fatigue, hemoptysis, sob or other complaints.  No chest pain, palpitations, or headaches. She does reports sinus drainage, pain and pressure.    ROS  General: (-) fever, (-) chills, (-) night sweats, (-) weakness Skin: (-) rashes, (-) itching,. Eyes: (-) visual changes, (-) redness, (-) itching. Nose and Sinuses: (-) nasal stuffiness or itchiness, (-) postnasal drip, (-) nosebleeds, (-) sinus trouble. Mouth and Throat: (-) sore throat, (-) hoarseness. Neck: (-) swollen glands, (-) enlarged thyroid, (-) neck pain. Respiratory: - cough, (-) bloody sputum, - shortness of breath, - wheezing. Cardiovascular: - ankle swelling, (-) chest pain. Lymphatic: (-) lymph node enlargement. Neurologic: (-) numbness, (-) tingling. Psychiatric: (-) anxiety, (-) depression   Current Medication: Outpatient Encounter Medications as of 06/14/2018  Medication Sig Note  . albuterol (PROAIR HFA) 108 (90 Base) MCG/ACT inhaler Inhale 2 puffs into the lungs every 6 (six) hours as needed for wheezing or shortness of breath.   Marland Kitchen amLODipine (NORVASC) 5 MG tablet Take 5 mg by mouth daily.   Marland Kitchen aspirin 81 MG tablet Take 81 mg by mouth daily.   . cholecalciferol (VITAMIN D) 1000 units tablet Take 1,000 Units by mouth daily.   . Cyanocobalamin (VITAMIN B12 PO) Take 1 tablet by mouth daily as needed. 04/21/2016: Does not take consistently  . Fluticasone-Salmeterol 232-14 MCG/ACT AEPB Inhale 1 puff into the lungs 2 (two) times daily.   . metoprolol succinate (TOPROL-XL) 50 MG 24 hr tablet Take 50 mg by mouth 2 (two) times daily. Take with or immediately  following a meal.   . omeprazole (PRILOSEC) 20 MG capsule Take 20 mg by mouth daily.   . simvastatin (ZOCOR) 40 MG tablet Take 40 mg by mouth daily.   Marland Kitchen amoxicillin-clavulanate (AUGMENTIN) 875-125 MG tablet Take 1 tablet by mouth 2 (two) times daily.   . fluticasone (FLONASE) 50 MCG/ACT nasal spray Place 2 sprays into both nostrils daily.    No facility-administered encounter medications on file as of 06/14/2018.     Surgical History: Past Surgical History:  Procedure Laterality Date  . ABDOMINAL HYSTERECTOMY    . BACK SURGERY  35 years  . BOWEL RESECTION  09/09/2014   Procedure: , bladder instilation;  Surgeon: Dia Crawford III, MD;  Location: [facility] ORS;  Service: General;;  . CARDIAC CATHETERIZATION Right 09/09/2014   Procedure: CENTRAL LINE INSERTION;  Surgeon: Dia Crawford III, MD;  Location: [facility] ORS;  Service: General;  Laterality: Right;  . CATARACT EXTRACTION W/ INTRAOCULAR LENS IMPLANT Right   . CATARACT EXTRACTION W/PHACO Left 05/11/2016   Procedure: CATARACT EXTRACTION PHACO AND INTRAOCULAR LENS PLACEMENT (IOC);  Surgeon: Estill Cotta, MD;  Location: [facility] ORS;  Service: Ophthalmology;  Laterality: Left;  [place] 1:25 AP% 25.4 CDE 36.38 Fluid pack lot # [id number]  . JOINT REPLACEMENT Right   . LAPAROTOMY N/A 09/09/2014   Procedure: EXPLORATORY LAPAROTOMY, small bowel resection;  Surgeon: Dia Crawford III, MD;  Location: [facility] ORS;  Service: General;  Laterality: N/A;  . NASAL SINUS SURGERY      Medical History: Past Medical History:  Diagnosis Date  . anesthesia    shaking after an injection  prior to cataract surgery  . Asthma   . 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 Dysrhythmia    A-fib  . GERD (gastroesophageal reflux disease)   . Headache    Migraines  . Heart murmur   . Hyperlipidemia   . Hypertension   . Mitral valve prolapse   . PONV (postoperative nausea and vomiting)     Family History: Family History  Problem Relation Age of Onset  . Heart  disease Mother   . Hypertension Mother   . Breast cancer Paternal Aunt 58       2 aunts  . Breast cancer Paternal Grandmother 60    Social History: Social History   Socioeconomic History  . Marital status: Married    Spouse name: Not on file  . Number of children: Not on file  . Years of education: Not on file  . Highest education level: Not on file  Occupational History  . Not on file  Social Needs  . Financial resource strain: Not on file  . Food insecurity:    Worry: Not on file    Inability: Not on file  . Transportation needs:    Medical: Not on file    Non-medical: Not on file  Tobacco Use  . Smoking status: Never Smoker  . Smokeless tobacco: Never Used  Substance and Sexual Activity  . Alcohol use: No  . Drug use: No  . Sexual activity: Not on file  Lifestyle  . Physical activity:    Days per week: Not on file    Minutes per session: Not on file  . Stress: Not on file  Relationships  . Social connections:    Talks on phone: Not on file    Gets together: Not on file    Attends religious service: Not on file    Active member of club or organization: Not on file    Attends meetings of clubs or organizations: Not on file    Relationship status: Not on file  . Intimate partner violence:    Fear of current or ex partner: Not on file    Emotionally abused: Not on file    Physically abused: Not on file    Forced sexual activity: Not on file  Other Topics Concern  . Not on file  Social History Narrative  . Not on file    Vital Signs: Blood pressure 122/80, pulse 92, resp. rate 16, height 5\' 6"  (1.676 m), weight 132 lb (59.9 kg), SpO2 98 %.  Examination: General Appearance: The patient is well-developed, well-nourished, and in no distress. Skin: Gross inspection of skin unremarkable. Head: normocephalic, no gross deformities. Eyes: no gross deformities noted. ENT: ears appear grossly normal no exudates. Neck: Supple. No thyromegaly. No LAD. Respiratory:  clear bilaterally. Cardiovascular: Normal S1 and S2 without murmur or rub. Extremities: No cyanosis. pulses are equal. Neurologic: Alert and oriented. No involuntary movements.  LABS: No results found for this or any previous visit (from the past 2160 hour(s)).  Radiology: Mm 3d Screen Breast Bilateral  Result Date: 03/22/2018 CLINICAL DATA:  Screening. EXAM: DIGITAL SCREENING BILATERAL MAMMOGRAM WITH TOMO AND CAD COMPARISON:  Previous exam(s). ACR Breast Density Category b: There are scattered areas of fibroglandular density. FINDINGS: There are no findings suspicious for malignancy. Images were processed with CAD. IMPRESSION: No mammographic evidence of malignancy. A result letter of this screening mammogram will be mailed directly to the patient. RECOMMENDATION: Screening mammogram in one year. (Code:SM-B-01Y)  BI-RADS CATEGORY  1: Negative. Electronically Signed   By: Franki Cabot M.D.   On: 03/22/2018 09:21    No results found.  No results found.    Assessment and Plan: Patient Active Problem List   Diagnosis Date Noted  . Migraine 06/15/2017  . Pelvic pain in female 06/15/2017  . Personal history of ovarian cyst 06/15/2017  . Reactive airway disease 12/28/2016  . Low serum vitamin D 07/04/2016  . Medicare annual wellness visit, initial 07/04/2016  . 'light-for-dates' infant with signs of fetal malnutrition 12/30/2015  . Hyperlipidemia, mixed 12/22/2015  . Perforated bowel ([place]) 10/06/2014  . Abdominal pain, acute, left lower quadrant 08/05/2014  . Hematochezia 08/05/2014  . Atrial tachycardia ([place]) 03/27/2014  . Benign essential hypertension 08/30/2013  . Ovarian cyst, bilateral 12/17/2012  . Right lower quadrant pain 12/17/2012  . Chronic cystitis 05/15/2012  . Incomplete emptying of bladder 05/15/2012  . Microscopic hematuria 05/15/2012  . Mixed urge and stress incontinence 05/15/2012  . Symptoms involving urinary system 05/15/2012    1. Chronic obstructive  pulmonary disease, unspecified COPD type ([place]) Stable, continue current therapy and symptom management.  2. Simple chronic bronchitis (HCC) Improved, Stable, continue current therapy.   3. SOB (shortness of breath) FVC is 3.0 which is a 9% of predicted value FEV1 is 2.3 which is 101% predicted value FEV1/FVC is 76% 103% of the predicted value today spirometry. - Spirometry with Graph  4. Acute non-recurrent maxillary sinusitis Provided patient with prescription for Augmentin and Flonase to help with her sinusitis.  Instructed patient to take all of the Augmentin as directed until complete.  She may return to clinic in 7 to 10 days if symptoms fail to improve. - fluticasone (FLONASE) 50 MCG/ACT nasal spray; Place 2 sprays into both nostrils daily.  Dispense: 16 g; Refill: 6 - amoxicillin-clavulanate (AUGMENTIN) 875-125 MG tablet; Take 1 tablet by mouth 2 (two) times daily.  Dispense: 14 tablet; Refill: 0  General Counseling: I have discussed the findings of the evaluation and examination with [facility].  I have also discussed any further diagnostic evaluation thatmay be needed or ordered today. Eleanora verbalizes understanding of the findings of todays visit. We also reviewed her medications today and discussed drug interactions and side effects including but not limited excessive drowsiness and altered mental states. We also discussed that there is always a risk not just to her but also people around her. she has been encouraged to call the office with any questions or concerns that should arise related to todays visit.    Time spent: 25 This patient was seen by Orson Gear AGNP-C in Collaboration with Dr. Devona Konig as a part of collaborative care agreement.   I have personally obtained a history, examined the patient, evaluated laboratory and imaging results, formulated the assessment and plan and placed orders.    Allyne Gee, MD [facility] Pulmonary and Critical Care Sleep  medicine

## 2018-06-14 NOTE — Patient Instructions (Signed)
Chronic Obstructive Pulmonary Disease Chronic obstructive pulmonary disease (COPD) is a long-term (chronic) lung problem. When you have COPD, it is hard for air to get in and out of your lungs. Usually the condition gets worse over time, and your lungs will never return to normal. There are things you can do to keep yourself as healthy as possible.  Your doctor may treat your condition with: ? Medicines. ? Oxygen. ? Lung surgery.  Your doctor may also recommend: ? Rehabilitation. This includes steps to make your body work better. It may involve a team of specialists. ? Quitting smoking, if you smoke. ? Exercise and changes to your diet. ? Comfort measures (palliative care). Follow these instructions at home: Medicines  Take over-the-counter and prescription medicines only as told by your doctor.  Talk to your doctor before taking any cough or allergy medicines. You may need to avoid medicines that cause your lungs to be dry. Lifestyle  If you smoke, stop. Smoking makes the problem worse. If you need help quitting, ask your doctor.  Avoid being around things that make your breathing worse. This may include smoke, chemicals, and fumes.  Stay active, but remember to rest as well.  Learn and use tips on how to relax.  Make sure you get enough sleep. Most adults need at least 7 hours of sleep every night.  Eat healthy foods. Eat smaller meals more often. Rest before meals. Controlled breathing Learn and use tips on how to control your breathing as told by your doctor. Try:  Breathing in (inhaling) through your nose for 1 second. Then, pucker your lips and breath out (exhale) through your lips for 2 seconds.  Putting one hand on your belly (abdomen). Breathe in slowly through your nose for 1 second. Your hand on your belly should move out. Pucker your lips and breathe out slowly through your lips. Your hand on your belly should move in as you breathe out.  Controlled coughing Learn  and use controlled coughing to clear mucus from your lungs. Follow these steps: 1. Lean your head a little forward. 2. Breathe in deeply. 3. Try to hold your breath for 3 seconds. 4. Keep your mouth slightly open while coughing 2 times. 5. Spit any mucus out into a tissue. 6. Rest and do the steps again 1 or 2 times as needed. General instructions  Make sure you get all the shots (vaccines) that your doctor recommends. Ask your doctor about a flu shot and a pneumonia shot.  Use oxygen therapy and pulmonary rehabilitation if told by your doctor. If you need home oxygen therapy, ask your doctor if you should buy a tool to measure your oxygen level (oximeter).  Make a COPD action plan with your doctor. This helps you to know what to do if you feel worse than usual.  Manage any other conditions you have as told by your doctor.  Avoid going outside when it is very hot, cold, or humid.  Avoid people who have a sickness you can catch (contagious).  Keep all follow-up visits as told by your doctor. This is important. Contact a doctor if:  You cough up more mucus than usual.  There is a change in the color or thickness of the mucus.  It is harder to breathe than usual.  Your breathing is faster than usual.  You have trouble sleeping.  You need to use your medicines more often than usual.  You have trouble doing your normal activities such as getting dressed   or walking around the house. Get help right away if:  You have shortness of breath while resting.  You have shortness of breath that stops you from: ? Being able to talk. ? Doing normal activities.  Your chest hurts for longer than 5 minutes.  Your skin color is more blue than usual.  Your pulse oximeter shows that you have low oxygen for longer than 5 minutes.  You have a fever.  You feel too tired to breathe normally. Summary  Chronic obstructive pulmonary disease (COPD) is a long-term lung problem.  The way your  lungs work will never return to normal. Usually the condition gets worse over time. There are things you can do to keep yourself as healthy as possible.  Take over-the-counter and prescription medicines only as told by your doctor.  If you smoke, stop. Smoking makes the problem worse. This information is not intended to replace advice given to you by your health care provider. Make sure you discuss any questions you have with your health care provider. Document Released: 09/14/2007 Document Revised: 05/02/2016 Document Reviewed: 05/02/2016 Elsevier Interactive Patient Education  2019 Elsevier Inc.  

## 2018-06-15 DIAGNOSIS — E782 Mixed hyperlipidemia: Secondary | ICD-10-CM | POA: Diagnosis not present

## 2018-06-15 DIAGNOSIS — E538 Deficiency of other specified B group vitamins: Secondary | ICD-10-CM | POA: Diagnosis not present

## 2018-06-15 DIAGNOSIS — J431 Panlobular emphysema: Secondary | ICD-10-CM | POA: Diagnosis not present

## 2018-06-25 ENCOUNTER — Other Ambulatory Visit (HOSPITAL_COMMUNITY)
Admission: RE | Admit: 2018-06-25 | Discharge: 2018-06-25 | Disposition: A | Discharge status: Home / Self Care | Payer: PPO | Source: Ambulatory Visit | Attending: Obstetrics & Gynecology | Admitting: Obstetrics & Gynecology

## 2018-06-25 ENCOUNTER — Other Ambulatory Visit: Payer: Self-pay

## 2018-06-25 ENCOUNTER — Encounter: Payer: Self-pay | Admitting: Obstetrics & Gynecology

## 2018-06-25 ENCOUNTER — Ambulatory Visit (INDEPENDENT_AMBULATORY_CARE_PROVIDER_SITE_OTHER): Payer: PPO | Admitting: Obstetrics & Gynecology

## 2018-06-25 VITALS — BP 120/80 | Ht 66.5 in | Wt 130.0 lb

## 2018-06-25 DIAGNOSIS — Z1272 Encounter for screening for malignant neoplasm of vagina: Secondary | ICD-10-CM

## 2018-06-25 DIAGNOSIS — Z8742 Personal history of other diseases of the female genital tract: Secondary | ICD-10-CM

## 2018-06-25 DIAGNOSIS — Z01419 Encounter for gynecological examination (general) (routine) without abnormal findings: Secondary | ICD-10-CM

## 2018-06-25 NOTE — Progress Notes (Signed)
HPI:      [name] is a 78 y.o. [id number] who LMP was in the past, she presents today for her annual examination.  The patient has no complaints today.  She has occasional lower quadrant pains, and has a history of ovarian cysts even post menopausally. Denies FH Ovarian cancer. The patient is not currently sexually active. Herlast pap: approximate date years ago and was normal and last mammogram: approximate date 03/2018 and was normal.  The patient does perform self breast exams.  There is no notable family history of breast or ovarian cancer in her family. The patient is not taking hormone replacement therapy. Patient denies post-menopausal vaginal bleeding.   The patient has regular exercise: yes. The patient denies current symptoms of depression.     PMHx: Past Medical History:  Diagnosis Date  . anesthesia    shaking after an injection prior to cataract surgery  . Asthma   . 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 Dysrhythmia    A-fib  . GERD (gastroesophageal reflux disease)   . Headache    Migraines  . Heart murmur   . Hyperlipidemia   . Hypertension   . Mitral valve prolapse   . PONV (postoperative nausea and vomiting)    Past Surgical History:  Procedure Laterality Date  . ABDOMINAL HYSTERECTOMY    . BACK SURGERY  35 years  . BOWEL RESECTION  09/09/2014   Procedure: , bladder instilation;  Surgeon: Dia Crawford III, MD;  Location: [facility] ORS;  Service: General;;  . CARDIAC CATHETERIZATION Right 09/09/2014   Procedure: CENTRAL LINE INSERTION;  Surgeon: Dia Crawford III, MD;  Location: [facility] ORS;  Service: General;  Laterality: Right;  . CATARACT EXTRACTION W/ INTRAOCULAR LENS IMPLANT Right   . CATARACT EXTRACTION W/PHACO Left 05/11/2016   Procedure: CATARACT EXTRACTION PHACO AND INTRAOCULAR LENS PLACEMENT (IOC);  Surgeon: Estill Cotta, MD;  Location: [facility] ORS;  Service: Ophthalmology;  Laterality: Left;  [place] 1:25 AP% 25.4 CDE 36.38 Fluid pack lot #  [id number]  . JOINT REPLACEMENT Right   . LAPAROTOMY N/A 09/09/2014   Procedure: EXPLORATORY LAPAROTOMY, small bowel resection;  Surgeon: Dia Crawford III, MD;  Location: [facility] ORS;  Service: General;  Laterality: N/A;  . NASAL SINUS SURGERY     Family History  Problem Relation Age of Onset  . Heart disease Mother   . Hypertension Mother   . Breast cancer Paternal Aunt 56       2 aunts  . Breast cancer Paternal Grandmother 55   Social History   Tobacco Use  . Smoking status: Never Smoker  . Smokeless tobacco: Never Used  Substance Use Topics  . Alcohol use: No  . Drug use: No    Current Outpatient Medications:  .  albuterol (PROAIR HFA) 108 (90 Base) MCG/ACT inhaler, Inhale 2 puffs into the lungs every 6 (six) hours as needed for wheezing or shortness of breath., Disp: 2 Inhaler, Rfl: 2 .  amLODipine (NORVASC) 5 MG tablet, Take 5 mg by mouth daily., Disp: , Rfl:  .  amoxicillin-clavulanate (AUGMENTIN) 875-125 MG tablet, Take 1 tablet by mouth 2 (two) times daily., Disp: 14 tablet, Rfl: 0 .  aspirin 81 MG tablet, Take 81 mg by mouth daily., Disp: , Rfl:  .  cholecalciferol (VITAMIN D) 1000 units tablet, Take 1,000 Units by mouth daily., Disp: , Rfl:  .  Cyanocobalamin (VITAMIN B12 PO), Take 1 tablet by mouth daily as needed., Disp: ,  Rfl:  .  fluticasone (FLONASE) 50 MCG/ACT nasal spray, Place 2 sprays into both nostrils daily., Disp: 16 g, Rfl: 6 .  Fluticasone-Salmeterol 232-14 MCG/ACT AEPB, Inhale 1 puff into the lungs 2 (two) times daily., Disp: 1 each, Rfl: 3 .  metoprolol succinate (TOPROL-XL) 50 MG 24 hr tablet, Take 50 mg by mouth 2 (two) times daily. Take with or immediately following a meal., Disp: , Rfl:  .  omeprazole (PRILOSEC) 20 MG capsule, Take 20 mg by mouth daily., Disp: , Rfl:  .  simvastatin (ZOCOR) 40 MG tablet, Take 40 mg by mouth daily., Disp: , Rfl:  Allergies: Codeine; Dilaudid [hydromorphone hcl]; Meperidine; and Vicodin [hydrocodone-acetaminophen]  Review of  Systems  Constitutional: Negative for chills, fever and malaise/fatigue.  HENT: Negative for congestion, sinus pain and sore throat.   Eyes: Negative for blurred vision and pain.  Respiratory: Negative for cough and wheezing.   Cardiovascular: Negative for chest pain and leg swelling.  Gastrointestinal: Negative for abdominal pain, constipation, diarrhea, heartburn, nausea and vomiting.  Genitourinary: Negative for dysuria, frequency, hematuria and urgency.  Musculoskeletal: Negative for back pain, joint pain, myalgias and neck pain.  Skin: Negative for itching and rash.  Neurological: Negative for dizziness, tremors and weakness.  Endo/Heme/Allergies: Does not bruise/bleed easily.  Psychiatric/Behavioral: Negative for depression. The patient is not nervous/anxious and does not have insomnia.     Objective: BP 120/80   Ht 5' 6.5" (1.689 m)   Wt 130 lb (59 kg)   BMI 20.67 kg/m   Filed Weights   06/25/18 0924  Weight: 130 lb (59 kg)   Body mass index is 20.67 kg/m. Physical Exam Constitutional:      General: She is not in acute distress.    Appearance: She is well-developed.  Genitourinary:     Pelvic exam was performed with patient supine.     Vagina and rectum normal.     No lesions in the vagina.     No vaginal bleeding.     No right or left adnexal mass present.     Right adnexa not tender.     Left adnexa not tender.     Genitourinary Comments: Absent Uterus Absent cervix Vaginal cuff well healed  HENT:     Head: Normocephalic and atraumatic. No laceration.     Right Ear: Hearing normal.     Left Ear: Hearing normal.     Mouth/Throat:     Pharynx: Uvula midline.  Eyes:     Pupils: Pupils are equal, round, and reactive to light.  Neck:     Musculoskeletal: Normal range of motion and neck supple.     Thyroid: No thyromegaly.  Cardiovascular:     Rate and Rhythm: Normal rate and regular rhythm.     Heart sounds: No murmur. No friction rub. No gallop.    Pulmonary:     Effort: Pulmonary effort is normal. No respiratory distress.     Breath sounds: Normal breath sounds. No wheezing.  Chest:     Breasts:        Right: No mass, skin change or tenderness.        Left: No mass, skin change or tenderness.  Abdominal:     General: Bowel sounds are normal. There is no distension.     Palpations: Abdomen is soft.     Tenderness: There is no abdominal tenderness. There is no rebound.  Musculoskeletal: Normal range of motion.  Neurological:     Mental Status: She is  alert and oriented to person, place, and time.     Cranial Nerves: No cranial nerve deficit.  Skin:    General: Skin is warm and dry.  Psychiatric:        Judgment: Judgment normal.  Vitals signs reviewed.     Assessment: Annual Exam 1. Women's annual routine gynecological examination   2. Personal history of ovarian cyst   3. Screening for vaginal cancer     Plan:            1.  Cervical Screening-  Pap smear done today  2. Breast screening- Exam annually and mammogram scheduled  3. Colonoscopy every 10 years, Hemoccult testing after age 25  4. Labs managed by PCP  5. Counseling for hormonal therapy: none  6. Pelvic US for assessment of ovarian cysts due to her history, sx's and concerns    F/U  Return in about 1 year (around 06/25/2019) for Annual, also needs GYN [place]  here or [place] in the next month (will call w results).  Barnett Applebaum, MD, [facility], [facility] 06/25/2018  9:50 AM

## 2018-06-25 NOTE — Patient Instructions (Signed)
PAP every three years Mammogram every year Colonoscopy every 10 years Labs yearly (with PCP)   

## 2018-06-27 LAB — CYTOLOGY - PAP: Diagnosis: NEGATIVE

## 2018-06-28 DIAGNOSIS — Z23 Encounter for immunization: Secondary | ICD-10-CM | POA: Diagnosis not present

## 2018-06-28 DIAGNOSIS — E782 Mixed hyperlipidemia: Secondary | ICD-10-CM | POA: Diagnosis not present

## 2018-06-28 DIAGNOSIS — E538 Deficiency of other specified B group vitamins: Secondary | ICD-10-CM | POA: Diagnosis not present

## 2018-06-28 DIAGNOSIS — Z0000 Encounter for general adult medical examination without abnormal findings: Secondary | ICD-10-CM | POA: Diagnosis not present

## 2018-06-28 DIAGNOSIS — J431 Panlobular emphysema: Secondary | ICD-10-CM | POA: Diagnosis not present

## 2018-07-19 ENCOUNTER — Telehealth: Payer: Self-pay | Admitting: Obstetrics & Gynecology

## 2018-07-19 ENCOUNTER — Other Ambulatory Visit: Payer: Self-pay

## 2018-07-19 ENCOUNTER — Ambulatory Visit (INDEPENDENT_AMBULATORY_CARE_PROVIDER_SITE_OTHER): Payer: PPO

## 2018-07-19 DIAGNOSIS — N83201 Unspecified ovarian cyst, right side: Secondary | ICD-10-CM | POA: Diagnosis not present

## 2018-07-19 DIAGNOSIS — R110 Nausea: Secondary | ICD-10-CM | POA: Diagnosis not present

## 2018-07-19 DIAGNOSIS — Z8742 Personal history of other diseases of the female genital tract: Secondary | ICD-10-CM | POA: Diagnosis not present

## 2018-07-19 DIAGNOSIS — R509 Fever, unspecified: Secondary | ICD-10-CM | POA: Diagnosis not present

## 2018-07-19 NOTE — Telephone Encounter (Signed)
-----   Message from Gae Dry, MD sent at 07/19/2018  2:11 PM EDT ----- [facility] tele appt to discuss ultrasound results

## 2018-07-19 NOTE — Telephone Encounter (Signed)
Patient is schedule 07/25/18 with [facility]

## 2018-07-19 NOTE — Progress Notes (Signed)
Sch tele appt to discuss ultrasound results

## 2018-07-25 ENCOUNTER — Other Ambulatory Visit: Payer: Self-pay

## 2018-07-25 ENCOUNTER — Ambulatory Visit (INDEPENDENT_AMBULATORY_CARE_PROVIDER_SITE_OTHER): Payer: PPO | Admitting: Obstetrics & Gynecology

## 2018-07-25 ENCOUNTER — Encounter: Payer: Self-pay | Admitting: Obstetrics & Gynecology

## 2018-07-25 DIAGNOSIS — N83201 Unspecified ovarian cyst, right side: Secondary | ICD-10-CM

## 2018-07-25 NOTE — Progress Notes (Signed)
Virtual Visit via Telephone Note  I connected with Hannah [name] on 07/25/18 at  4:30 PM EDT by telephone and verified that I am speaking with the correct person using two identifiers.   I discussed the limitations, risks, security and privacy concerns of performing an evaluation and management service by telephone and the availability of in person appointments. I also discussed with the patient that there may be a patient responsible charge related to this service. The patient expressed understanding and agreed to proceed.  She was at home and I was in my office.   History of Present Illness: Pt has had h/o ovarian cysts in past.  She reports occasion lower quadrant pains. No pain today.  No recent bloating or weight changes.  Prior hysterectomy.  Ultrasound demonstrates cyst seen on right ovary,. See below  PMHx: She  has a past medical history of anesthesia, Asthma, Cancer ([place]), Dysrhythmia, GERD (gastroesophageal reflux disease), Headache, Heart murmur, Hyperlipidemia, Hypertension, Mitral valve prolapse, and PONV (postoperative nausea and vomiting). Also,  has a past surgical history that includes Back surgery (35 years); Abdominal hysterectomy; laparotomy (N/A, 09/09/2014); Bowel resection (09/09/2014); Cardiac catheterization (Right, 09/09/2014); Joint replacement (Right); Nasal sinus surgery; Cataract extraction w/ intraocular lens implant (Right); and Cataract extraction w/PHACO (Left, 05/11/2016)., family history includes Breast cancer (age of onset: 48) in her paternal grandmother; Breast cancer (age of onset: 29) in her paternal aunt; Heart disease in her mother; Hypertension in her mother.,  reports that she has never smoked. She has never used smokeless tobacco. She reports that she does not drink alcohol or use drugs.  She has a current medication list which includes the following prescription(s): albuterol, amlodipine, amoxicillin-clavulanate, aspirin, cholecalciferol,  cyanocobalamin, fluticasone, fluticasone-salmeterol, metoprolol succinate, omeprazole, and simvastatin. Also, is allergic to codeine; dilaudid [hydromorphone hcl]; meperidine; and vicodin [hydrocodone-acetaminophen].  Review of Systems  All other systems reviewed and are negative.    Observations/Objective: No exam today, due to telephone eVisit due to [facility] [place] virus restriction on elective visits and procedures.  Prior visits reviewed along with ultrasounds/labs as indicated.   Assessment and Plan: 1. Right ovarian cyst Small and no cancer characteristics Monitor for now, repeat US as sx's warrant Surgery not a good option at this time  Follow Up Instructions: As needed   I discussed the assessment and treatment plan with the patient. The patient was provided an opportunity to ask questions and all were answered. The patient agreed with the plan and demonstrated an understanding of the instructions.   The patient was advised to call back or seek an in-person evaluation if the symptoms worsen or if the condition fails to improve as anticipated.  I provided 8 minutes of non-face-to-face time during this encounter.   Hoyt Koch, MD [facility], [facility] 07/25/2018  4:51 PM

## 2018-08-06 DIAGNOSIS — M5030 Other cervical disc degeneration, unspecified cervical region: Secondary | ICD-10-CM | POA: Diagnosis not present

## 2018-08-06 DIAGNOSIS — M542 Cervicalgia: Secondary | ICD-10-CM | POA: Diagnosis not present

## 2018-08-08 DIAGNOSIS — M6281 Muscle weakness (generalized): Secondary | ICD-10-CM | POA: Diagnosis not present

## 2018-08-08 DIAGNOSIS — M5030 Other cervical disc degeneration, unspecified cervical region: Secondary | ICD-10-CM | POA: Diagnosis not present

## 2018-08-08 DIAGNOSIS — M542 Cervicalgia: Secondary | ICD-10-CM | POA: Diagnosis not present

## 2018-08-10 DIAGNOSIS — H5789 Other specified disorders of eye and adnexa: Secondary | ICD-10-CM | POA: Diagnosis not present

## 2018-08-27 DIAGNOSIS — Z961 Presence of intraocular lens: Secondary | ICD-10-CM | POA: Diagnosis not present

## 2018-08-31 DIAGNOSIS — M542 Cervicalgia: Secondary | ICD-10-CM | POA: Diagnosis not present

## 2018-08-31 DIAGNOSIS — I471 Supraventricular tachycardia: Secondary | ICD-10-CM | POA: Diagnosis not present

## 2018-09-20 DIAGNOSIS — K219 Gastro-esophageal reflux disease without esophagitis: Secondary | ICD-10-CM | POA: Diagnosis not present

## 2018-09-20 DIAGNOSIS — J449 Chronic obstructive pulmonary disease, unspecified: Secondary | ICD-10-CM | POA: Diagnosis not present

## 2018-09-20 DIAGNOSIS — R000 Tachycardia, unspecified: Secondary | ICD-10-CM | POA: Diagnosis not present

## 2018-09-20 DIAGNOSIS — I10 Essential (primary) hypertension: Secondary | ICD-10-CM | POA: Diagnosis not present

## 2018-09-20 DIAGNOSIS — E782 Mixed hyperlipidemia: Secondary | ICD-10-CM | POA: Diagnosis not present

## 2018-09-20 DIAGNOSIS — R002 Palpitations: Secondary | ICD-10-CM | POA: Diagnosis not present

## 2018-09-20 DIAGNOSIS — I471 Supraventricular tachycardia: Secondary | ICD-10-CM | POA: Diagnosis not present

## 2018-09-25 DIAGNOSIS — N390 Urinary tract infection, site not specified: Secondary | ICD-10-CM | POA: Diagnosis not present

## 2018-09-25 DIAGNOSIS — R350 Frequency of micturition: Secondary | ICD-10-CM | POA: Diagnosis not present

## 2018-09-28 DIAGNOSIS — R102 Pelvic and perineal pain: Secondary | ICD-10-CM | POA: Diagnosis not present

## 2018-09-28 DIAGNOSIS — N390 Urinary tract infection, site not specified: Secondary | ICD-10-CM | POA: Diagnosis not present

## 2018-09-28 DIAGNOSIS — N3010 Interstitial cystitis (chronic) without hematuria: Secondary | ICD-10-CM | POA: Diagnosis not present

## 2018-09-28 DIAGNOSIS — N3000 Acute cystitis without hematuria: Secondary | ICD-10-CM | POA: Diagnosis not present

## 2018-10-18 DIAGNOSIS — H6982 Other specified disorders of Eustachian tube, left ear: Secondary | ICD-10-CM | POA: Diagnosis not present

## 2018-10-18 DIAGNOSIS — N390 Urinary tract infection, site not specified: Secondary | ICD-10-CM | POA: Diagnosis not present

## 2018-10-22 ENCOUNTER — Encounter: Payer: Self-pay | Admitting: Obstetrics & Gynecology

## 2018-10-22 ENCOUNTER — Other Ambulatory Visit: Payer: Self-pay

## 2018-10-22 ENCOUNTER — Ambulatory Visit (INDEPENDENT_AMBULATORY_CARE_PROVIDER_SITE_OTHER): Payer: PPO | Admitting: Obstetrics & Gynecology

## 2018-10-22 VITALS — BP 130/80 | Ht 66.0 in | Wt 129.0 lb

## 2018-10-22 DIAGNOSIS — R300 Dysuria: Secondary | ICD-10-CM

## 2018-10-22 DIAGNOSIS — N3000 Acute cystitis without hematuria: Secondary | ICD-10-CM | POA: Diagnosis not present

## 2018-10-22 LAB — POCT URINALYSIS DIPSTICK
Bilirubin, UA: NEGATIVE
Blood, UA: NEGATIVE
Glucose, UA: NEGATIVE
Ketones, UA: NEGATIVE
Nitrite, UA: POSITIVE
Protein, UA: NEGATIVE
Spec Grav, UA: 1.01 (ref 1.010–1.025)
Urobilinogen, UA: 0.2 E.U./dL
pH, UA: 5 (ref 5.0–8.0)

## 2018-10-22 MED ORDER — FLAVOXATE HCL 100 MG PO TABS: 100.0000 mg | ORAL_TABLET | Freq: Three times a day (TID) | ORAL | 1 refills | Status: DC | PRN

## 2018-10-22 MED ORDER — SULFAMETHOXAZOLE-TRIMETHOPRIM 400-80 MG PO TABS: 1.0000 | ORAL_TABLET | Freq: Two times a day (BID) | ORAL | 0 refills | Status: DC

## 2018-10-22 NOTE — Patient Instructions (Signed)
Hyoscyamine; Methenamine; Methylene Blue; Phenyl; Sodium Biphosphate Oral What is this medicine? HYOSCYAMINE; METHENAMINE; METHYLENE BLUE; PHENYL SALYCILATE; SODIUM BIPHOSPHATE is used to help stop the pain, burning, or discomfort caused by infection or irritation of the urinary tract. This medicine is not an antibiotic. It will not cure a urinary tract infection. This medicine may be used for other purposes; ask your health care provider or pharmacist if you have questions. COMMON BRAND NAME(S): [place]uphen MB, Darcalma, Hyolev MB, MD 20, MSP Blu, Phosenamine, Phosphasal, UR N-C, Uramit, Urelle, Uretron DS, [place], Urimar-T, Urimax, Urin DS, [place], Uro Keddie, Uro-L, Lib[place], [place], [place], [place], [place], [place], [place], [place], [place] MB What should I tell my health care provider before I take this medicine? They need to know if you have any of these conditions:  bladder or prostate problems or trouble passing urine  glaucoma  heart disease  myasthenia gravis  stomach problems  an unusual or allergic reaction to hyoscyamine; methenamine; methylene blue; phenyl salicylate; sodium biphosphate, other medicines, foods, dyes, or preservatives  pregnant or trying to get pregnant  breast-feeding How should I use this medicine? Take this medicine by mouth with a glass of water. Follow the directions on the prescription label. Take your medicine at regular intervals. Do not take it more often than directed. Do not stop taking except on your doctor's advice. Talk to your pediatrician regarding the use of this medicine in children. While this drug may be prescribed for children as young as 7 years for selected conditions, precautions do apply. Overdosage: If you think you have taken too much of this medicine contact a poison control center or emergency room at once. NOTE: This medicine is only for you. Do not share this medicine with others. What if I miss a dose? If you miss a dose, take it as soon  as you can. If it is almost time for your next dose, take only that dose. Do not take double or extra doses. What may interact with this medicine?  antacids  atropine  antihistamines for allergy, cough and cold  certain antibiotics like sulfacetamide and sulfamethoxazole  certain medicines for bladder problems like oxybutynin, tolterodine  certain medicines for blood pressure like hydrochlorothiazide  certain medicines for stomach problems like dicyclomine, hyoscyamine  certain medicines for travel sickness like scopolamine  certain medicines for Parkinson's disease like benztropine, trihexyphenidyl  ipratropium  ketoconazole  MAOIs like Carbex, Eldepryl, Marplan, Nardil, and Parnate  narcotic medicines for pain This list may not describe all possible interactions. Give your health care provider a list of all the medicines, herbs, non-prescription drugs, or dietary supplements you use. Also tell them if you smoke, drink alcohol, or use illegal drugs. Some items may interact with your medicine. What should I watch for while using this medicine? Tell your doctor or healthcare professional if your symptoms do not start to get better or if they get worse. You may get drowsy or dizzy. Do not drive, use machinery, or do anything that needs mental alertness until you know how this medicine affects you. Do not stand or sit up quickly, especially if you are an older patient. This reduces the risk of dizzy or fainting spells. Your mouth may get dry. Chewing sugarless gum or sucking hard candy, and drinking plenty of water may help. Contact your doctor if the problem does not go away or is severe. You will need to be on a special diet while taking this medicine. Ask your health care professional how many glasses of  water or other fluids to drink each day. Also, ask which foods to include and which to avoid to help keep your urine acidic. Your urine must be acidic for this medicine to  work. This medicine may cause dry eyes and blurred vision. If you wear contact lenses you may feel some discomfort. Lubricating drops may help. See your eye doctor if the problem does not go away or is severe. What side effects may I notice from receiving this medicine? Side effects that you should report to your doctor or health care professional as soon as possible:  allergic reactions like skin rash, itching or hives, swelling of the face, lips, or tongue  blurred vision  breathing problems  dizziness  rapid pulse  trouble passing urine or change in the amount of urine Side effects that usually do not require medical attention (report to your doctor or health care professional if they continue or are bothersome):  blue or blue-green urine or stools  dry mouth  flushing  nausea, vomiting  tiredness This list may not describe all possible side effects. Call your doctor for medical advice about side effects. You may report side effects to FDA at 1-800-FDA-1088. Where should I keep my medicine? Keep out of the reach of children. Store at room temperature between 15 and 30 degrees C (59 and 86 degrees F). Throw away any unused medicine after the expiration date. NOTE: This sheet is a summary. It may not cover all possible information. If you have questions about this medicine, talk to your doctor, pharmacist, or health care provider.  2020 Elsevier/Gold Standard (2015-04-30 10:04:16)

## 2018-10-22 NOTE — Progress Notes (Signed)
HPI:      [name] is a 78 y.o. [id number] who LMP was No LMP recorded. Patient has had a hysterectomy., presents today for a problem visit.    Urinary Tract Infection: Patient complains of dysuria, frequency and urgency . She has had symptoms for about a month. Patient also complains of back pain. Patient denies fever. Patient does not have a history of recurrent UTI.  Patient does not have a history of pyelonephritis. She was seen at walk in clinic one month ago with dx UTI and placed on Augmentin.  Then saw PCP Hannah [name]) for other reasons but still had sx's so placed on Macrobid (one week ago).  No improvement.  PMHx: She  has a past medical history of anesthesia, Asthma, Cancer ([place]), Dysrhythmia, GERD (gastroesophageal reflux disease), Headache, Heart murmur, Hyperlipidemia, Hypertension, Mitral valve prolapse, and PONV (postoperative nausea and vomiting). Also,  has a past surgical history that includes Back surgery (35 years); Abdominal hysterectomy; laparotomy (N/A, 09/09/2014); Bowel resection (09/09/2014); Cardiac catheterization (Right, 09/09/2014); Joint replacement (Right); Nasal sinus surgery; Cataract extraction w/ intraocular lens implant (Right); and Cataract extraction w/PHACO (Left, 05/11/2016)., family history includes Breast cancer (age of onset: 32) in her paternal grandmother; Breast cancer (age of onset: 74) in her paternal aunt; Heart disease in her mother; Hypertension in her mother.,  reports that she has never smoked. She has never used smokeless tobacco. She reports that she does not drink alcohol or use drugs.  She has a current medication list which includes the following prescription(s): albuterol, amlodipine, aspirin, cholecalciferol, cyanocobalamin, fluticasone, fluticasone-salmeterol, metoprolol succinate, omeprazole, simvastatin, flavoxate, and sulfamethoxazole-trimethoprim. Also, is allergic to codeine; dilaudid [hydromorphone hcl]; meperidine; and vicodin  [hydrocodone-acetaminophen].  Review of Systems  Constitutional: Negative for chills, fever and malaise/fatigue.  HENT: Negative for congestion, sinus pain and sore throat.   Eyes: Negative for blurred vision and pain.  Respiratory: Negative for cough and wheezing.   Cardiovascular: Negative for chest pain and leg swelling.  Gastrointestinal: Negative for abdominal pain, constipation, diarrhea, heartburn, nausea and vomiting.  Genitourinary: Negative for dysuria, frequency, hematuria and urgency.  Musculoskeletal: Negative for back pain, joint pain, myalgias and neck pain.  Skin: Negative for itching and rash.  Neurological: Negative for dizziness, tremors and weakness.  Endo/Heme/Allergies: Does not bruise/bleed easily.  Psychiatric/Behavioral: Negative for depression. The patient is not nervous/anxious and does not have insomnia.     Objective: BP 130/80   Ht 5\' 6"  (1.676 m)   Wt 129 lb (58.5 kg)   BMI 20.82 kg/m  Physical Exam Constitutional:      General: She is not in acute distress.    Appearance: She is well-developed.  Musculoskeletal: Normal range of motion.  Neurological:     Mental Status: She is alert and oriented to person, place, and time.  Skin:    General: Skin is warm and dry.  Vitals signs reviewed.   Back- No CVAT, mild lower midline T to palpation  Results for orders placed or performed in visit on 10/22/18  POCT urinalysis dipstick  Result Value Ref Range   Color, UA     Clarity, UA     Glucose, UA Negative Negative   Bilirubin, UA neg    Ketones, UA neg    Spec Grav, UA 1.010 1.010 - 1.025   Blood, UA neg    pH, UA 5.0 5.0 - 8.0   Protein, UA Negative Negative   Urobilinogen, UA 0.2 0.2 or 1.0 E.U./dL   Nitrite, UA  positive    Leukocytes, UA Small (1+) (A) Negative   Appearance     Odor      ASSESSMENT/PLAN:   Acute cystitis  Problem List Items Addressed This Visit    Dysuria    -  Primary   Relevant Medications   flavoxATE (URISPAS)  100 MG tablet   Other Relevant Orders   POCT urinalysis dipstick (Completed)   Acute cystitis without hematuria       Relevant Medications   sulfamethoxazole-trimethoprim (BACTRIM) 400-80 MG tablet   Other Relevant Orders   Urine Culture Check sensitivities and bacteria type  Pt also expresses concerns over prior dx of IC    UA pos for infection so needs tx     If sx's persists, f/u UA    Consider IC etiology if sx's persist w clear UA's  OK to have UA testing without office visit w call back of results at any time based on her h/o frequent UTI vs IC    A total of 15 minutes were spent face-to-face with the patient during this encounter and over half of that time dealt with counseling and coordination of care.  Barnett Applebaum, MD, [facility], [facility] 10/22/2018  10:01 AM

## 2018-10-24 LAB — URINE CULTURE

## 2018-11-08 DIAGNOSIS — R42 Dizziness and giddiness: Secondary | ICD-10-CM | POA: Diagnosis not present

## 2018-11-08 DIAGNOSIS — J309 Allergic rhinitis, unspecified: Secondary | ICD-10-CM | POA: Diagnosis not present

## 2018-11-21 DIAGNOSIS — C44519 Basal cell carcinoma of skin of other part of trunk: Secondary | ICD-10-CM | POA: Diagnosis not present

## 2018-11-21 DIAGNOSIS — D485 Neoplasm of uncertain behavior of skin: Secondary | ICD-10-CM | POA: Diagnosis not present

## 2018-12-10 DIAGNOSIS — C44519 Basal cell carcinoma of skin of other part of trunk: Secondary | ICD-10-CM | POA: Diagnosis not present

## 2018-12-10 DIAGNOSIS — C4491 Basal cell carcinoma of skin, unspecified: Secondary | ICD-10-CM | POA: Diagnosis not present

## 2018-12-24 ENCOUNTER — Ambulatory Visit: Payer: PPO | Admitting: Internal Medicine

## 2018-12-25 DIAGNOSIS — E538 Deficiency of other specified B group vitamins: Secondary | ICD-10-CM | POA: Diagnosis not present

## 2018-12-25 DIAGNOSIS — E782 Mixed hyperlipidemia: Secondary | ICD-10-CM | POA: Diagnosis not present

## 2019-01-02 DIAGNOSIS — J431 Panlobular emphysema: Secondary | ICD-10-CM | POA: Diagnosis not present

## 2019-01-02 DIAGNOSIS — E782 Mixed hyperlipidemia: Secondary | ICD-10-CM | POA: Diagnosis not present

## 2019-01-02 DIAGNOSIS — Z0000 Encounter for general adult medical examination without abnormal findings: Secondary | ICD-10-CM | POA: Diagnosis not present

## 2019-01-02 DIAGNOSIS — Z23 Encounter for immunization: Secondary | ICD-10-CM | POA: Diagnosis not present

## 2019-01-02 DIAGNOSIS — E538 Deficiency of other specified B group vitamins: Secondary | ICD-10-CM | POA: Diagnosis not present

## 2019-03-06 DIAGNOSIS — N390 Urinary tract infection, site not specified: Secondary | ICD-10-CM | POA: Diagnosis not present

## 2019-03-06 DIAGNOSIS — N3010 Interstitial cystitis (chronic) without hematuria: Secondary | ICD-10-CM | POA: Diagnosis not present

## 2019-03-06 DIAGNOSIS — Z1331 Encounter for screening for depression: Secondary | ICD-10-CM | POA: Diagnosis not present

## 2019-03-06 DIAGNOSIS — I10 Essential (primary) hypertension: Secondary | ICD-10-CM | POA: Diagnosis not present

## 2019-03-06 DIAGNOSIS — J019 Acute sinusitis, unspecified: Secondary | ICD-10-CM | POA: Diagnosis not present

## 2019-04-25 DIAGNOSIS — M40202 Unspecified kyphosis, cervical region: Secondary | ICD-10-CM | POA: Diagnosis not present

## 2019-04-25 DIAGNOSIS — M545 Low back pain: Secondary | ICD-10-CM | POA: Diagnosis not present

## 2019-04-25 DIAGNOSIS — N329 Bladder disorder, unspecified: Secondary | ICD-10-CM | POA: Diagnosis not present

## 2019-04-25 DIAGNOSIS — I10 Essential (primary) hypertension: Secondary | ICD-10-CM | POA: Diagnosis not present

## 2019-04-30 DIAGNOSIS — J208 Acute bronchitis due to other specified organisms: Secondary | ICD-10-CM | POA: Diagnosis not present

## 2019-04-30 DIAGNOSIS — U071 COVID-19: Secondary | ICD-10-CM | POA: Diagnosis not present

## 2019-04-30 DIAGNOSIS — R918 Other nonspecific abnormal finding of lung field: Secondary | ICD-10-CM | POA: Diagnosis not present

## 2019-05-02 DIAGNOSIS — U071 COVID-19: Secondary | ICD-10-CM | POA: Diagnosis not present

## 2019-05-02 DIAGNOSIS — J208 Acute bronchitis due to other specified organisms: Secondary | ICD-10-CM | POA: Diagnosis not present

## 2019-05-06 DIAGNOSIS — U071 COVID-19: Secondary | ICD-10-CM | POA: Diagnosis not present

## 2019-05-06 DIAGNOSIS — J208 Acute bronchitis due to other specified organisms: Secondary | ICD-10-CM | POA: Diagnosis not present

## 2019-05-13 DIAGNOSIS — J1282 Pneumonia due to coronavirus disease 2019: Secondary | ICD-10-CM | POA: Diagnosis not present

## 2019-05-13 DIAGNOSIS — J1289 Other viral pneumonia: Secondary | ICD-10-CM | POA: Diagnosis not present

## 2019-05-13 DIAGNOSIS — J431 Panlobular emphysema: Secondary | ICD-10-CM | POA: Diagnosis not present

## 2019-05-13 DIAGNOSIS — U071 COVID-19: Secondary | ICD-10-CM | POA: Diagnosis not present

## 2019-05-21 DIAGNOSIS — K2100 Gastro-esophageal reflux disease with esophagitis, without bleeding: Secondary | ICD-10-CM | POA: Diagnosis not present

## 2019-05-21 DIAGNOSIS — I471 Supraventricular tachycardia: Secondary | ICD-10-CM | POA: Diagnosis not present

## 2019-06-11 DIAGNOSIS — L578 Other skin changes due to chronic exposure to nonionizing radiation: Secondary | ICD-10-CM | POA: Diagnosis not present

## 2019-06-11 DIAGNOSIS — L821 Other seborrheic keratosis: Secondary | ICD-10-CM | POA: Diagnosis not present

## 2019-06-11 DIAGNOSIS — Z85828 Personal history of other malignant neoplasm of skin: Secondary | ICD-10-CM | POA: Diagnosis not present

## 2019-06-11 DIAGNOSIS — L905 Scar conditions and fibrosis of skin: Secondary | ICD-10-CM | POA: Diagnosis not present

## 2019-06-18 DIAGNOSIS — H1131 Conjunctival hemorrhage, right eye: Secondary | ICD-10-CM | POA: Diagnosis not present

## 2019-06-26 DIAGNOSIS — E538 Deficiency of other specified B group vitamins: Secondary | ICD-10-CM | POA: Diagnosis not present

## 2019-06-26 DIAGNOSIS — E782 Mixed hyperlipidemia: Secondary | ICD-10-CM | POA: Diagnosis not present

## 2019-07-03 DIAGNOSIS — Z0000 Encounter for general adult medical examination without abnormal findings: Secondary | ICD-10-CM | POA: Diagnosis not present

## 2019-07-03 DIAGNOSIS — I471 Supraventricular tachycardia: Secondary | ICD-10-CM | POA: Diagnosis not present

## 2019-07-03 DIAGNOSIS — E782 Mixed hyperlipidemia: Secondary | ICD-10-CM | POA: Diagnosis not present

## 2019-07-03 DIAGNOSIS — E538 Deficiency of other specified B group vitamins: Secondary | ICD-10-CM | POA: Diagnosis not present

## 2019-07-03 DIAGNOSIS — J431 Panlobular emphysema: Secondary | ICD-10-CM | POA: Diagnosis not present

## 2019-07-09 DIAGNOSIS — I10 Essential (primary) hypertension: Secondary | ICD-10-CM | POA: Diagnosis not present

## 2019-07-09 DIAGNOSIS — Z1389 Encounter for screening for other disorder: Secondary | ICD-10-CM | POA: Diagnosis not present

## 2019-07-09 DIAGNOSIS — J019 Acute sinusitis, unspecified: Secondary | ICD-10-CM | POA: Diagnosis not present

## 2019-07-23 DIAGNOSIS — M542 Cervicalgia: Secondary | ICD-10-CM | POA: Diagnosis not present

## 2019-07-23 DIAGNOSIS — M5030 Other cervical disc degeneration, unspecified cervical region: Secondary | ICD-10-CM | POA: Diagnosis not present

## 2019-07-26 DIAGNOSIS — M542 Cervicalgia: Secondary | ICD-10-CM | POA: Diagnosis not present

## 2019-07-29 DIAGNOSIS — M542 Cervicalgia: Secondary | ICD-10-CM | POA: Diagnosis not present

## 2019-08-06 DIAGNOSIS — M542 Cervicalgia: Secondary | ICD-10-CM | POA: Diagnosis not present

## 2019-09-20 DIAGNOSIS — R0600 Dyspnea, unspecified: Secondary | ICD-10-CM | POA: Diagnosis not present

## 2019-09-20 DIAGNOSIS — J984 Other disorders of lung: Secondary | ICD-10-CM | POA: Diagnosis not present

## 2019-09-20 DIAGNOSIS — U071 COVID-19: Secondary | ICD-10-CM | POA: Diagnosis not present

## 2019-09-20 DIAGNOSIS — R509 Fever, unspecified: Secondary | ICD-10-CM | POA: Diagnosis not present

## 2019-09-26 DIAGNOSIS — J449 Chronic obstructive pulmonary disease, unspecified: Secondary | ICD-10-CM | POA: Diagnosis not present

## 2019-09-26 DIAGNOSIS — R002 Palpitations: Secondary | ICD-10-CM | POA: Diagnosis not present

## 2019-09-26 DIAGNOSIS — Z8616 Personal history of COVID-19: Secondary | ICD-10-CM | POA: Diagnosis not present

## 2019-09-26 DIAGNOSIS — R011 Cardiac murmur, unspecified: Secondary | ICD-10-CM | POA: Diagnosis not present

## 2019-09-26 DIAGNOSIS — I10 Essential (primary) hypertension: Secondary | ICD-10-CM | POA: Diagnosis not present

## 2019-09-26 DIAGNOSIS — I471 Supraventricular tachycardia: Secondary | ICD-10-CM | POA: Diagnosis not present

## 2019-09-26 DIAGNOSIS — R0602 Shortness of breath: Secondary | ICD-10-CM | POA: Diagnosis not present

## 2019-09-26 DIAGNOSIS — R000 Tachycardia, unspecified: Secondary | ICD-10-CM | POA: Diagnosis not present

## 2019-09-26 DIAGNOSIS — I208 Other forms of angina pectoris: Secondary | ICD-10-CM | POA: Diagnosis not present

## 2019-09-26 DIAGNOSIS — E782 Mixed hyperlipidemia: Secondary | ICD-10-CM | POA: Diagnosis not present

## 2019-09-30 DIAGNOSIS — L298 Other pruritus: Secondary | ICD-10-CM | POA: Diagnosis not present

## 2019-10-02 DIAGNOSIS — N3090 Cystitis, unspecified without hematuria: Secondary | ICD-10-CM | POA: Diagnosis not present

## 2019-10-02 DIAGNOSIS — R5383 Other fatigue: Secondary | ICD-10-CM | POA: Diagnosis not present

## 2019-10-02 DIAGNOSIS — R0789 Other chest pain: Secondary | ICD-10-CM | POA: Diagnosis not present

## 2019-10-09 DIAGNOSIS — L821 Other seborrheic keratosis: Secondary | ICD-10-CM | POA: Diagnosis not present

## 2019-10-09 DIAGNOSIS — L570 Actinic keratosis: Secondary | ICD-10-CM | POA: Diagnosis not present

## 2019-10-09 DIAGNOSIS — L814 Other melanin hyperpigmentation: Secondary | ICD-10-CM | POA: Diagnosis not present

## 2019-10-09 DIAGNOSIS — D1801 Hemangioma of skin and subcutaneous tissue: Secondary | ICD-10-CM | POA: Diagnosis not present

## 2019-10-09 DIAGNOSIS — L820 Inflamed seborrheic keratosis: Secondary | ICD-10-CM | POA: Diagnosis not present

## 2019-10-09 DIAGNOSIS — D492 Neoplasm of unspecified behavior of bone, soft tissue, and skin: Secondary | ICD-10-CM | POA: Diagnosis not present

## 2019-10-09 DIAGNOSIS — D0461 Carcinoma in situ of skin of right upper limb, including shoulder: Secondary | ICD-10-CM | POA: Diagnosis not present

## 2019-10-25 DIAGNOSIS — Z1231 Encounter for screening mammogram for malignant neoplasm of breast: Secondary | ICD-10-CM | POA: Diagnosis not present

## 2019-10-25 DIAGNOSIS — Z9289 Personal history of other medical treatment: Secondary | ICD-10-CM | POA: Diagnosis not present

## 2019-11-06 DIAGNOSIS — C44622 Squamous cell carcinoma of skin of right upper limb, including shoulder: Secondary | ICD-10-CM | POA: Diagnosis not present

## 2019-11-06 DIAGNOSIS — D492 Neoplasm of unspecified behavior of bone, soft tissue, and skin: Secondary | ICD-10-CM | POA: Diagnosis not present

## 2019-11-07 DIAGNOSIS — R062 Wheezing: Secondary | ICD-10-CM | POA: Diagnosis not present

## 2019-11-07 DIAGNOSIS — I208 Other forms of angina pectoris: Secondary | ICD-10-CM | POA: Diagnosis not present

## 2019-11-07 DIAGNOSIS — R011 Cardiac murmur, unspecified: Secondary | ICD-10-CM | POA: Diagnosis not present

## 2019-11-13 DIAGNOSIS — E782 Mixed hyperlipidemia: Secondary | ICD-10-CM | POA: Diagnosis not present

## 2019-11-13 DIAGNOSIS — R0602 Shortness of breath: Secondary | ICD-10-CM | POA: Diagnosis not present

## 2019-11-13 DIAGNOSIS — I208 Other forms of angina pectoris: Secondary | ICD-10-CM | POA: Diagnosis not present

## 2019-11-13 DIAGNOSIS — I10 Essential (primary) hypertension: Secondary | ICD-10-CM | POA: Diagnosis not present

## 2019-11-13 DIAGNOSIS — R399 Unspecified symptoms and signs involving the genitourinary system: Secondary | ICD-10-CM | POA: Diagnosis not present

## 2019-11-13 DIAGNOSIS — I471 Supraventricular tachycardia: Secondary | ICD-10-CM | POA: Diagnosis not present

## 2019-12-30 DIAGNOSIS — E782 Mixed hyperlipidemia: Secondary | ICD-10-CM | POA: Diagnosis not present

## 2019-12-30 DIAGNOSIS — E538 Deficiency of other specified B group vitamins: Secondary | ICD-10-CM | POA: Diagnosis not present

## 2020-01-06 DIAGNOSIS — E538 Deficiency of other specified B group vitamins: Secondary | ICD-10-CM | POA: Diagnosis not present

## 2020-01-06 DIAGNOSIS — Z23 Encounter for immunization: Secondary | ICD-10-CM | POA: Diagnosis not present

## 2020-01-06 DIAGNOSIS — U071 COVID-19: Secondary | ICD-10-CM | POA: Diagnosis not present

## 2020-01-06 DIAGNOSIS — E782 Mixed hyperlipidemia: Secondary | ICD-10-CM | POA: Diagnosis not present

## 2020-01-06 DIAGNOSIS — J208 Acute bronchitis due to other specified organisms: Secondary | ICD-10-CM | POA: Diagnosis not present

## 2020-01-06 DIAGNOSIS — Z0000 Encounter for general adult medical examination without abnormal findings: Secondary | ICD-10-CM | POA: Diagnosis not present

## 2020-01-30 ENCOUNTER — Other Ambulatory Visit: Payer: Self-pay

## 2020-01-30 ENCOUNTER — Ambulatory Visit (INDEPENDENT_AMBULATORY_CARE_PROVIDER_SITE_OTHER): Payer: PPO | Admitting: Obstetrics & Gynecology

## 2020-01-30 ENCOUNTER — Encounter: Payer: Self-pay | Admitting: Obstetrics & Gynecology

## 2020-01-30 VITALS — BP 120/80 | Ht 66.5 in | Wt 127.0 lb

## 2020-01-30 DIAGNOSIS — N83201 Unspecified ovarian cyst, right side: Secondary | ICD-10-CM | POA: Diagnosis not present

## 2020-01-30 DIAGNOSIS — R300 Dysuria: Secondary | ICD-10-CM

## 2020-01-30 DIAGNOSIS — R3915 Urgency of urination: Secondary | ICD-10-CM | POA: Diagnosis not present

## 2020-01-30 MED ORDER — FLAVOXATE HCL 100 MG PO TABS: 100.0000 mg | ORAL_TABLET | Freq: Three times a day (TID) | ORAL | 1 refills | Status: DC | PRN

## 2020-01-30 NOTE — Progress Notes (Signed)
  History of Present Illness:  Hannah [name] is a 79 y.o. who was started on Urispas last year w UTI, which she recovered from and has done well, until 2 weeks ago had IC flare with urinary urgency and discomfort for several days, and had no more medicine.  Cranberry OTC has helped.  Denies LOU, nocturia, odor, fever, back pain.  Also, pt has h/o small right ovarian cyst, as seen by [place] last year.  She has not had any pain until over the last month, she has occas left or right sided groinpain, mild, no radiation, no associated sx's.  PMHx: She  has a past medical history of anesthesia, Asthma, Cancer ([place]), Dysrhythmia, GERD (gastroesophageal reflux disease), Headache, Heart murmur, Hyperlipidemia, Hypertension, Mitral valve prolapse, and PONV (postoperative nausea and vomiting). Also,  has a past surgical history that includes Back surgery (35 years); Abdominal hysterectomy; laparotomy (N/A, 09/09/2014); Bowel resection (09/09/2014); Cardiac catheterization (Right, 09/09/2014); Joint replacement (Right); Nasal sinus surgery; Cataract extraction w/ intraocular lens implant (Right); and Cataract extraction w/PHACO (Left, 05/11/2016)., family history includes Breast cancer (age of onset: 81) in her paternal grandmother; Breast cancer (age of onset: 3) in her paternal aunt; Heart disease in her mother; Hypertension in her mother.,  reports that she has never smoked. She has never used smokeless tobacco. She reports that she does not drink alcohol and does not use drugs. Current Meds  Medication Sig  . amLODipine (NORVASC) 5 MG tablet Take 5 mg by mouth daily.  Marland Kitchen aspirin 81 MG tablet Take 81 mg by mouth daily.  . metoprolol succinate (TOPROL-XL) 50 MG 24 hr tablet Take 50 mg by mouth 2 (two) times daily. Take with or immediately following a meal.  . Also, is allergic to codeine, dilaudid [hydromorphone hcl], meperidine, and vicodin [hydrocodone-acetaminophen]..  Review of Systems  All other systems  reviewed and are negative.   Physical Exam:  BP 120/80   Ht 5' 6.5" (1.689 m)   Wt 127 lb (57.6 kg)   BMI 20.19 kg/m  Body mass index is 20.19 kg/m. Constitutional: Well nourished, well developed female in no acute distress.  Abdomen: diffusely non tender to palpation, non distended, and no masses, hernias Neuro: Grossly intact Psych:  Normal mood and affect.    Assessment:  Problem List Items Addressed This Visit    Right ovarian cyst    -  Has h/o small and benign appearing cyst, and nopw w occas pains warrants investigation; pt also expresses worry at times over fact she has a cyst   Relevant Orders   US PELVIC COMPLETE WITH TRANSVAGINAL   Urgency of urination       Dysuria       Relevant Medications   flavoxATE (URISPAS) 100 MG tablet - resume PRN use Monitor for s/sx recurrent IC or urgency, consider preventative tx if frequent pattern     A total of 20 minutes were spent face-to-face with the patient as well as preparation, review, communication, and documentation during this encounter.   Barnett Applebaum, MD, [facility], [facility] 01/30/2020  11:18 AM

## 2020-01-30 NOTE — Patient Instructions (Signed)
Thank you for choosing [facility]. As part of our ongoing efforts to improve patient experience, we would appreciate your feedback. Please fill out the short survey that you will receive by mail or MyChart. Your opinion is important to us! -Dr Milani Lowenstein  

## 2020-02-17 DIAGNOSIS — Z8616 Personal history of COVID-19: Secondary | ICD-10-CM | POA: Diagnosis not present

## 2020-02-17 DIAGNOSIS — E782 Mixed hyperlipidemia: Secondary | ICD-10-CM | POA: Diagnosis not present

## 2020-02-17 DIAGNOSIS — I471 Supraventricular tachycardia: Secondary | ICD-10-CM | POA: Diagnosis not present

## 2020-02-17 DIAGNOSIS — I10 Essential (primary) hypertension: Secondary | ICD-10-CM | POA: Diagnosis not present

## 2020-02-17 DIAGNOSIS — R0602 Shortness of breath: Secondary | ICD-10-CM | POA: Diagnosis not present

## 2020-02-17 DIAGNOSIS — I208 Other forms of angina pectoris: Secondary | ICD-10-CM | POA: Diagnosis not present

## 2020-02-17 DIAGNOSIS — R002 Palpitations: Secondary | ICD-10-CM | POA: Diagnosis not present

## 2020-02-17 DIAGNOSIS — R399 Unspecified symptoms and signs involving the genitourinary system: Secondary | ICD-10-CM | POA: Diagnosis not present

## 2020-02-17 DIAGNOSIS — R000 Tachycardia, unspecified: Secondary | ICD-10-CM | POA: Diagnosis not present

## 2020-02-17 DIAGNOSIS — J449 Chronic obstructive pulmonary disease, unspecified: Secondary | ICD-10-CM | POA: Diagnosis not present

## 2020-02-19 DIAGNOSIS — U071 COVID-19: Secondary | ICD-10-CM | POA: Diagnosis not present

## 2020-02-19 DIAGNOSIS — A09 Infectious gastroenteritis and colitis, unspecified: Secondary | ICD-10-CM | POA: Diagnosis not present

## 2020-02-19 DIAGNOSIS — J208 Acute bronchitis due to other specified organisms: Secondary | ICD-10-CM | POA: Diagnosis not present

## 2020-02-21 ENCOUNTER — Ambulatory Visit: Payer: PPO

## 2020-02-21 ENCOUNTER — Ambulatory Visit: Payer: PPO | Admitting: Obstetrics & Gynecology

## 2020-02-21 DIAGNOSIS — N83201 Unspecified ovarian cyst, right side: Secondary | ICD-10-CM

## 2020-03-23 ENCOUNTER — Ambulatory Visit: Payer: PPO

## 2020-03-23 ENCOUNTER — Ambulatory Visit: Payer: PPO | Admitting: Obstetrics & Gynecology

## 2020-05-05 DIAGNOSIS — L299 Pruritus, unspecified: Secondary | ICD-10-CM | POA: Diagnosis not present

## 2020-05-05 DIAGNOSIS — Z85828 Personal history of other malignant neoplasm of skin: Secondary | ICD-10-CM | POA: Diagnosis not present

## 2020-05-27 DIAGNOSIS — S299XXA Unspecified injury of thorax, initial encounter: Secondary | ICD-10-CM | POA: Diagnosis not present

## 2020-05-27 DIAGNOSIS — S2232XA Fracture of one rib, left side, initial encounter for closed fracture: Secondary | ICD-10-CM | POA: Diagnosis not present

## 2020-05-27 DIAGNOSIS — M47814 Spondylosis without myelopathy or radiculopathy, thoracic region: Secondary | ICD-10-CM | POA: Diagnosis not present

## 2020-05-27 DIAGNOSIS — N3289 Other specified disorders of bladder: Secondary | ICD-10-CM | POA: Diagnosis not present

## 2020-05-27 DIAGNOSIS — N3010 Interstitial cystitis (chronic) without hematuria: Secondary | ICD-10-CM | POA: Diagnosis not present

## 2020-05-27 DIAGNOSIS — N952 Postmenopausal atrophic vaginitis: Secondary | ICD-10-CM | POA: Diagnosis not present

## 2020-05-27 DIAGNOSIS — M4184 Other forms of scoliosis, thoracic region: Secondary | ICD-10-CM | POA: Diagnosis not present

## 2020-06-03 DIAGNOSIS — I471 Supraventricular tachycardia: Secondary | ICD-10-CM | POA: Diagnosis not present

## 2020-06-03 DIAGNOSIS — Z8616 Personal history of COVID-19: Secondary | ICD-10-CM | POA: Diagnosis not present

## 2020-06-03 DIAGNOSIS — E782 Mixed hyperlipidemia: Secondary | ICD-10-CM | POA: Diagnosis not present

## 2020-06-03 DIAGNOSIS — J449 Chronic obstructive pulmonary disease, unspecified: Secondary | ICD-10-CM | POA: Diagnosis not present

## 2020-06-03 DIAGNOSIS — I10 Essential (primary) hypertension: Secondary | ICD-10-CM | POA: Diagnosis not present

## 2020-06-03 DIAGNOSIS — R011 Cardiac murmur, unspecified: Secondary | ICD-10-CM | POA: Diagnosis not present

## 2020-06-03 DIAGNOSIS — R399 Unspecified symptoms and signs involving the genitourinary system: Secondary | ICD-10-CM | POA: Diagnosis not present

## 2020-06-03 DIAGNOSIS — R002 Palpitations: Secondary | ICD-10-CM | POA: Diagnosis not present

## 2020-06-03 DIAGNOSIS — R000 Tachycardia, unspecified: Secondary | ICD-10-CM | POA: Diagnosis not present

## 2020-06-03 DIAGNOSIS — I208 Other forms of angina pectoris: Secondary | ICD-10-CM | POA: Diagnosis not present

## 2020-06-03 DIAGNOSIS — R0602 Shortness of breath: Secondary | ICD-10-CM | POA: Diagnosis not present

## 2020-06-09 DIAGNOSIS — M810 Age-related osteoporosis without current pathological fracture: Secondary | ICD-10-CM | POA: Diagnosis not present

## 2020-06-09 DIAGNOSIS — M8000XA Age-related osteoporosis with current pathological fracture, unspecified site, initial encounter for fracture: Secondary | ICD-10-CM | POA: Diagnosis not present

## 2020-06-09 DIAGNOSIS — R0781 Pleurodynia: Secondary | ICD-10-CM | POA: Diagnosis not present

## 2020-06-19 ENCOUNTER — Ambulatory Visit (INDEPENDENT_AMBULATORY_CARE_PROVIDER_SITE_OTHER): Payer: PPO | Admitting: Obstetrics & Gynecology

## 2020-06-19 ENCOUNTER — Other Ambulatory Visit: Payer: Self-pay

## 2020-06-19 ENCOUNTER — Encounter: Payer: Self-pay | Admitting: Obstetrics & Gynecology

## 2020-06-19 VITALS — BP 124/80 | Ht 66.0 in | Wt 124.0 lb

## 2020-06-19 DIAGNOSIS — R3915 Urgency of urination: Secondary | ICD-10-CM

## 2020-06-19 DIAGNOSIS — R102 Pelvic and perineal pain: Secondary | ICD-10-CM

## 2020-06-19 DIAGNOSIS — R300 Dysuria: Secondary | ICD-10-CM | POA: Diagnosis not present

## 2020-06-19 LAB — POCT URINALYSIS DIPSTICK
Bilirubin, UA: NEGATIVE
Blood, UA: NEGATIVE
Glucose, UA: NEGATIVE
Ketones, UA: NEGATIVE
Leukocytes, UA: NEGATIVE
Nitrite, UA: NEGATIVE
Protein, UA: NEGATIVE
Spec Grav, UA: 1.01 (ref 1.010–1.025)
Urobilinogen, UA: 0.2 E.U./dL
pH, UA: 5 (ref 5.0–8.0)

## 2020-06-19 MED ORDER — FLAVOXATE HCL 100 MG PO TABS: 100.0000 mg | ORAL_TABLET | Freq: Four times a day (QID) | ORAL | 3 refills | Status: DC | PRN

## 2020-06-19 NOTE — Progress Notes (Signed)
  History of Present Illness:  Hannah [name] is a 80 y.o. who   She was started on Urispas PRN approximately last year for [place] related to IC vs OAB.  Prior Oxybutynin w side effects.  She has not been taking the Urispas.  Reports 2 weeks ago started having L>R LQ pains, as well as urinary freq, urge, and nocturia (2-3 times nightly).  Rare LOU.  Pain does not radiate and is not associated w other findings, no modifiers, and was given Cipro by another doctor with  No relief (no UA testing done then).  Since that time, she states that her symptoms show no change.  PMHx: She  has a past medical history of anesthesia, Asthma, Cancer ([place]), Dysrhythmia, GERD (gastroesophageal reflux disease), Headache, Heart murmur, Hyperlipidemia, Hypertension, Mitral valve prolapse, and PONV (postoperative nausea and vomiting). Also,  has a past surgical history that includes Back surgery (35 years); Abdominal hysterectomy; laparotomy (N/A, 09/09/2014); Bowel resection (09/09/2014); Cardiac catheterization (Right, 09/09/2014); Joint replacement (Right); Nasal sinus surgery; Cataract extraction w/ intraocular lens implant (Right); and Cataract extraction w/PHACO (Left, 05/11/2016)., family history includes Breast cancer (age of onset: 32) in her paternal grandmother; Breast cancer (age of onset: 36) in her paternal aunt; Heart disease in her mother; Hypertension in her mother.,  reports that she has never smoked. She has never used smokeless tobacco. She reports that she does not drink alcohol and does not use drugs. Current Meds  Medication Sig  . amLODipine (NORVASC) 5 MG tablet Take 5 mg by mouth daily.  Marland Kitchen aspirin 81 MG tablet Take 81 mg by mouth daily.  . metoprolol succinate (TOPROL-XL) 50 MG 24 hr tablet Take 50 mg by mouth 2 (two) times daily. Take with or immediately following a meal.  . [DISCONTINUED] flavoxATE (URISPAS) 100 MG tablet Take 1 tablet (100 mg total) by mouth 3 (three) times daily as needed for  bladder spasms (bladder spasm).  . Also, is allergic to codeine, dilaudid [hydromorphone hcl], meperidine, and vicodin [hydrocodone-acetaminophen]..  Review of Systems  All other systems reviewed and are negative.   Physical Exam:  BP 124/80   Ht 5\' 6"  (1.676 m)   Wt 124 lb (56.2 kg)   BMI 20.01 kg/m  Body mass index is 20.01 kg/m. Constitutional: Well nourished, well developed female in no acute distress.  Abdomen: diffusely non tender to palpation, non distended, and no masses, hernias Neuro: Grossly intact Psych:  Normal mood and affect.    UA NEG  Assessment:  Problem List Items Addressed This Visit      Other   Pelvic pain in female    Other Visit Diagnoses    Urgency of urination    -  Primary   Relevant Orders   POCT urinalysis dipstick (Completed) NEG    Plan: Plan tx for presumptive IC, vs OAB, and treated similarly. No evidence for infection or stone.   Urispas, more routinely.  Consider change to OAB medicine such as Myrbetriq. Consider [place] for ovarian cyst (prior hx) as well if LLQ pain persists. Plan tele f/u in 1-2 weeks  A total of 20 minutes were spent face-to-face with the patient as well as preparation, review, communication, and documentation during this encounter.   Barnett Applebaum, MD, [facility], [facility] 06/19/2020  11:20 AM

## 2020-07-01 DIAGNOSIS — E782 Mixed hyperlipidemia: Secondary | ICD-10-CM | POA: Diagnosis not present

## 2020-07-03 DIAGNOSIS — N3010 Interstitial cystitis (chronic) without hematuria: Secondary | ICD-10-CM | POA: Diagnosis not present

## 2020-07-06 DIAGNOSIS — M818 Other osteoporosis without current pathological fracture: Secondary | ICD-10-CM | POA: Diagnosis not present

## 2020-07-06 DIAGNOSIS — I471 Supraventricular tachycardia: Secondary | ICD-10-CM | POA: Diagnosis not present

## 2020-07-06 DIAGNOSIS — E559 Vitamin D deficiency, unspecified: Secondary | ICD-10-CM | POA: Diagnosis not present

## 2020-07-06 DIAGNOSIS — E538 Deficiency of other specified B group vitamins: Secondary | ICD-10-CM | POA: Diagnosis not present

## 2020-07-06 DIAGNOSIS — J431 Panlobular emphysema: Secondary | ICD-10-CM | POA: Diagnosis not present

## 2020-07-06 DIAGNOSIS — E782 Mixed hyperlipidemia: Secondary | ICD-10-CM | POA: Diagnosis not present

## 2020-07-06 DIAGNOSIS — Z0000 Encounter for general adult medical examination without abnormal findings: Secondary | ICD-10-CM | POA: Diagnosis not present

## 2020-07-09 ENCOUNTER — Encounter: Payer: Self-pay | Admitting: Obstetrics & Gynecology

## 2020-07-09 ENCOUNTER — Other Ambulatory Visit: Payer: Self-pay

## 2020-07-09 ENCOUNTER — Ambulatory Visit: Payer: PPO | Admitting: Obstetrics & Gynecology

## 2020-07-09 VITALS — Ht 66.5 in | Wt 122.0 lb

## 2020-07-09 DIAGNOSIS — R3915 Urgency of urination: Secondary | ICD-10-CM

## 2020-07-09 DIAGNOSIS — R102 Pelvic and perineal pain: Secondary | ICD-10-CM

## 2020-07-09 NOTE — Progress Notes (Signed)
Virtual Visit via Telephone Note  I connected with Sangeeta Youse on 07/09/20 at 11:00 AM EDT by telephone and verified that I am speaking with the correct person using two identifiers.  Location: Patient: Home Provider: Office   I discussed the limitations, risks, security and privacy concerns of performing an evaluation and management service by telephone and the availability of in person appointments. I also discussed with the patient that there may be a patient responsible charge related to this service. The patient expressed understanding and agreed to proceed.   History of Present Illness:   Daya Dutt is a 80 y.o. who was started on  . flavoxATE (URISPAS) 100 MG tablet Take 1 tablet (100 mg total) by mouth 4 (four) times daily as needed for bladder spasms (bladder spasm).   approximately 3 weeks ago. Since that time, she states that her symptoms are improving.  She is now taking daily and feels less urgency.  She reports a slower stream.  Min  Pain on the left side that she previously had.  PMHx: She  has a past medical history of anesthesia, Asthma, Cancer ([place]), Dysrhythmia, GERD (gastroesophageal reflux disease), Headache, Heart murmur, Hyperlipidemia, Hypertension, Mitral valve prolapse, and PONV (postoperative nausea and vomiting). Also,  has a past surgical history that includes Back surgery (35 years); Abdominal hysterectomy; laparotomy (N/A, 09/09/2014); Bowel resection (09/09/2014); Cardiac catheterization (Right, 09/09/2014); Joint replacement (Right); Nasal sinus surgery; Cataract extraction w/ intraocular lens implant (Right); and Cataract extraction w/PHACO (Left, 05/11/2016)., family history includes Breast cancer (age of onset: 12) in her paternal grandmother; Breast cancer (age of onset: 20) in her paternal aunt; Heart disease in her mother; Hypertension in her mother.,  reports that she has never smoked. She has never used smokeless tobacco. She reports that she  does not drink alcohol and does not use drugs. Current Meds  Medication Sig  . amLODipine (NORVASC) 5 MG tablet Take 5 mg by mouth daily.  Marland Kitchen aspirin 81 MG tablet Take 81 mg by mouth daily.  . flavoxATE (URISPAS) 100 MG tablet Take 1 tablet (100 mg total) by mouth 4 (four) times daily as needed for bladder spasms (bladder spasm).  . metoprolol succinate (TOPROL-XL) 50 MG 24 hr tablet Take 50 mg by mouth 2 (two) times daily. Take with or immediately following a meal.  . Also, is allergic to codeine, dilaudid [hydromorphone hcl], meperidine, and vicodin [hydrocodone-acetaminophen]..  Review of Systems  All other systems reviewed and are negative.    Observations/Objective: No exam today, due to telephone eVisit due to [facility] virus restriction on elective visits and procedures.  Prior visits reviewed along with ultrasounds/labs as indicated.  Assessment and Plan:   ICD-10-CM   1. Urgency of urination  R39.15   2. Pelvic pain in female  R10.2   Pain improved, no need for f/u US at this time Urgency improved w daily use Urispas, continue for now  Follow Up Instructions: As needed, and yearly   I discussed the assessment and treatment plan with the patient. The patient was provided an opportunity to ask questions and all were answered. The patient agreed with the plan and demonstrated an understanding of the instructions.   The patient was advised to call back or seek an in-person evaluation if the symptoms worsen or if the condition fails to improve as anticipated.  I provided 15 minutes of non-face-to-face time during this encounter.   Hoyt Koch, MD

## 2020-08-14 ENCOUNTER — Other Ambulatory Visit: Payer: Self-pay | Admitting: Internal Medicine

## 2020-08-14 DIAGNOSIS — Z1231 Encounter for screening mammogram for malignant neoplasm of breast: Secondary | ICD-10-CM

## 2020-09-21 DIAGNOSIS — M79641 Pain in right hand: Secondary | ICD-10-CM | POA: Diagnosis not present

## 2020-09-21 DIAGNOSIS — J0100 Acute maxillary sinusitis, unspecified: Secondary | ICD-10-CM | POA: Diagnosis not present

## 2020-09-21 DIAGNOSIS — M79642 Pain in left hand: Secondary | ICD-10-CM | POA: Diagnosis not present

## 2020-09-21 DIAGNOSIS — G5603 Carpal tunnel syndrome, bilateral upper limbs: Secondary | ICD-10-CM | POA: Diagnosis not present

## 2020-10-01 DIAGNOSIS — M542 Cervicalgia: Secondary | ICD-10-CM | POA: Diagnosis not present

## 2020-10-01 DIAGNOSIS — G5603 Carpal tunnel syndrome, bilateral upper limbs: Secondary | ICD-10-CM | POA: Diagnosis not present

## 2020-10-01 DIAGNOSIS — M5412 Radiculopathy, cervical region: Secondary | ICD-10-CM | POA: Diagnosis not present

## 2020-10-01 DIAGNOSIS — M5030 Other cervical disc degeneration, unspecified cervical region: Secondary | ICD-10-CM | POA: Diagnosis not present

## 2020-10-27 ENCOUNTER — Inpatient Hospital Stay: Admission: RE | Admit: 2020-10-27 | Payer: PPO | Source: Ambulatory Visit

## 2020-11-23 DIAGNOSIS — J019 Acute sinusitis, unspecified: Secondary | ICD-10-CM | POA: Diagnosis not present

## 2020-11-23 DIAGNOSIS — J3489 Other specified disorders of nose and nasal sinuses: Secondary | ICD-10-CM | POA: Diagnosis not present

## 2020-12-08 DIAGNOSIS — G5603 Carpal tunnel syndrome, bilateral upper limbs: Secondary | ICD-10-CM | POA: Diagnosis not present

## 2020-12-08 DIAGNOSIS — J431 Panlobular emphysema: Secondary | ICD-10-CM | POA: Diagnosis not present

## 2020-12-16 DIAGNOSIS — Z961 Presence of intraocular lens: Secondary | ICD-10-CM | POA: Diagnosis not present

## 2020-12-17 ENCOUNTER — Ambulatory Visit
Admission: RE | Admit: 2020-12-17 | Discharge: 2020-12-17 | Disposition: A | Discharge status: Home / Self Care | Payer: PPO | Source: Ambulatory Visit | Attending: Internal Medicine | Admitting: Internal Medicine

## 2020-12-17 ENCOUNTER — Other Ambulatory Visit: Payer: Self-pay

## 2020-12-17 DIAGNOSIS — Z1231 Encounter for screening mammogram for malignant neoplasm of breast: Secondary | ICD-10-CM | POA: Insufficient documentation

## 2020-12-25 DIAGNOSIS — G5603 Carpal tunnel syndrome, bilateral upper limbs: Secondary | ICD-10-CM | POA: Diagnosis not present

## 2020-12-25 DIAGNOSIS — M19041 Primary osteoarthritis, right hand: Secondary | ICD-10-CM | POA: Diagnosis not present

## 2020-12-25 DIAGNOSIS — M79642 Pain in left hand: Secondary | ICD-10-CM | POA: Diagnosis not present

## 2020-12-25 DIAGNOSIS — M19042 Primary osteoarthritis, left hand: Secondary | ICD-10-CM | POA: Diagnosis not present

## 2020-12-25 DIAGNOSIS — R0781 Pleurodynia: Secondary | ICD-10-CM | POA: Diagnosis not present

## 2020-12-25 DIAGNOSIS — M65341 Trigger finger, right ring finger: Secondary | ICD-10-CM | POA: Diagnosis not present

## 2020-12-25 DIAGNOSIS — M79641 Pain in right hand: Secondary | ICD-10-CM | POA: Diagnosis not present

## 2021-01-07 DIAGNOSIS — E782 Mixed hyperlipidemia: Secondary | ICD-10-CM | POA: Diagnosis not present

## 2021-01-07 DIAGNOSIS — E559 Vitamin D deficiency, unspecified: Secondary | ICD-10-CM | POA: Diagnosis not present

## 2021-01-07 DIAGNOSIS — E538 Deficiency of other specified B group vitamins: Secondary | ICD-10-CM | POA: Diagnosis not present

## 2021-01-13 DIAGNOSIS — J431 Panlobular emphysema: Secondary | ICD-10-CM | POA: Diagnosis not present

## 2021-01-13 DIAGNOSIS — Z23 Encounter for immunization: Secondary | ICD-10-CM | POA: Diagnosis not present

## 2021-01-13 DIAGNOSIS — E538 Deficiency of other specified B group vitamins: Secondary | ICD-10-CM | POA: Diagnosis not present

## 2021-01-13 DIAGNOSIS — M1712 Unilateral primary osteoarthritis, left knee: Secondary | ICD-10-CM | POA: Diagnosis not present

## 2021-01-13 DIAGNOSIS — E782 Mixed hyperlipidemia: Secondary | ICD-10-CM | POA: Diagnosis not present

## 2021-01-13 DIAGNOSIS — Z0000 Encounter for general adult medical examination without abnormal findings: Secondary | ICD-10-CM | POA: Diagnosis not present

## 2021-01-28 DIAGNOSIS — J019 Acute sinusitis, unspecified: Secondary | ICD-10-CM | POA: Diagnosis not present

## 2021-01-28 DIAGNOSIS — I10 Essential (primary) hypertension: Secondary | ICD-10-CM | POA: Diagnosis not present

## 2021-03-11 DIAGNOSIS — I10 Essential (primary) hypertension: Secondary | ICD-10-CM | POA: Diagnosis not present

## 2021-03-11 DIAGNOSIS — R5382 Chronic fatigue, unspecified: Secondary | ICD-10-CM | POA: Diagnosis not present

## 2021-03-11 DIAGNOSIS — I471 Supraventricular tachycardia: Secondary | ICD-10-CM | POA: Diagnosis not present

## 2021-03-11 DIAGNOSIS — R0781 Pleurodynia: Secondary | ICD-10-CM | POA: Diagnosis not present

## 2021-03-11 DIAGNOSIS — R6889 Other general symptoms and signs: Secondary | ICD-10-CM | POA: Diagnosis not present

## 2021-03-11 DIAGNOSIS — M79602 Pain in left arm: Secondary | ICD-10-CM | POA: Diagnosis not present

## 2021-03-11 DIAGNOSIS — E782 Mixed hyperlipidemia: Secondary | ICD-10-CM | POA: Diagnosis not present

## 2021-03-11 DIAGNOSIS — N644 Mastodynia: Secondary | ICD-10-CM | POA: Diagnosis not present

## 2021-03-23 DIAGNOSIS — Z23 Encounter for immunization: Secondary | ICD-10-CM | POA: Diagnosis not present

## 2021-03-23 DIAGNOSIS — R079 Chest pain, unspecified: Secondary | ICD-10-CM | POA: Diagnosis not present

## 2021-03-23 DIAGNOSIS — R0781 Pleurodynia: Secondary | ICD-10-CM | POA: Diagnosis not present

## 2021-03-23 DIAGNOSIS — M5414 Radiculopathy, thoracic region: Secondary | ICD-10-CM | POA: Diagnosis not present

## 2021-04-08 ENCOUNTER — Ambulatory Visit: Payer: PPO | Admitting: Obstetrics & Gynecology

## 2021-04-13 DIAGNOSIS — R002 Palpitations: Secondary | ICD-10-CM | POA: Diagnosis not present

## 2021-04-13 DIAGNOSIS — I471 Supraventricular tachycardia: Secondary | ICD-10-CM | POA: Diagnosis not present

## 2021-04-13 DIAGNOSIS — R5382 Chronic fatigue, unspecified: Secondary | ICD-10-CM | POA: Diagnosis not present

## 2021-04-13 DIAGNOSIS — I208 Other forms of angina pectoris: Secondary | ICD-10-CM | POA: Diagnosis not present

## 2021-04-13 DIAGNOSIS — R000 Tachycardia, unspecified: Secondary | ICD-10-CM | POA: Diagnosis not present

## 2021-04-13 DIAGNOSIS — R0602 Shortness of breath: Secondary | ICD-10-CM | POA: Diagnosis not present

## 2021-04-13 DIAGNOSIS — J449 Chronic obstructive pulmonary disease, unspecified: Secondary | ICD-10-CM | POA: Diagnosis not present

## 2021-04-13 DIAGNOSIS — I10 Essential (primary) hypertension: Secondary | ICD-10-CM | POA: Diagnosis not present

## 2021-04-13 DIAGNOSIS — R011 Cardiac murmur, unspecified: Secondary | ICD-10-CM | POA: Diagnosis not present

## 2021-04-13 DIAGNOSIS — E782 Mixed hyperlipidemia: Secondary | ICD-10-CM | POA: Diagnosis not present

## 2021-04-20 DIAGNOSIS — R251 Tremor, unspecified: Secondary | ICD-10-CM | POA: Diagnosis not present

## 2021-04-20 DIAGNOSIS — R739 Hyperglycemia, unspecified: Secondary | ICD-10-CM | POA: Diagnosis not present

## 2021-05-13 ENCOUNTER — Ambulatory Visit: Payer: PPO | Admitting: Obstetrics & Gynecology

## 2021-06-04 DIAGNOSIS — N3281 Overactive bladder: Secondary | ICD-10-CM | POA: Diagnosis not present

## 2021-06-04 DIAGNOSIS — N952 Postmenopausal atrophic vaginitis: Secondary | ICD-10-CM | POA: Diagnosis not present

## 2021-07-01 DIAGNOSIS — R000 Tachycardia, unspecified: Secondary | ICD-10-CM | POA: Diagnosis not present

## 2021-07-01 DIAGNOSIS — R002 Palpitations: Secondary | ICD-10-CM | POA: Diagnosis not present

## 2021-07-01 DIAGNOSIS — R0602 Shortness of breath: Secondary | ICD-10-CM | POA: Diagnosis not present

## 2021-07-01 DIAGNOSIS — J449 Chronic obstructive pulmonary disease, unspecified: Secondary | ICD-10-CM | POA: Diagnosis not present

## 2021-07-01 DIAGNOSIS — I471 Supraventricular tachycardia: Secondary | ICD-10-CM | POA: Diagnosis not present

## 2021-07-01 DIAGNOSIS — I208 Other forms of angina pectoris: Secondary | ICD-10-CM | POA: Diagnosis not present

## 2021-07-01 DIAGNOSIS — I10 Essential (primary) hypertension: Secondary | ICD-10-CM | POA: Diagnosis not present

## 2021-07-07 DIAGNOSIS — R1031 Right lower quadrant pain: Secondary | ICD-10-CM | POA: Diagnosis not present

## 2021-07-07 DIAGNOSIS — R1032 Left lower quadrant pain: Secondary | ICD-10-CM | POA: Diagnosis not present

## 2021-07-07 DIAGNOSIS — H539 Unspecified visual disturbance: Secondary | ICD-10-CM | POA: Diagnosis not present

## 2021-07-08 DIAGNOSIS — E538 Deficiency of other specified B group vitamins: Secondary | ICD-10-CM | POA: Diagnosis not present

## 2021-07-08 DIAGNOSIS — E782 Mixed hyperlipidemia: Secondary | ICD-10-CM | POA: Diagnosis not present

## 2021-07-14 DIAGNOSIS — Z79899 Other long term (current) drug therapy: Secondary | ICD-10-CM | POA: Diagnosis not present

## 2021-07-14 DIAGNOSIS — E538 Deficiency of other specified B group vitamins: Secondary | ICD-10-CM | POA: Diagnosis not present

## 2021-07-14 DIAGNOSIS — M818 Other osteoporosis without current pathological fracture: Secondary | ICD-10-CM | POA: Diagnosis not present

## 2021-07-14 DIAGNOSIS — J431 Panlobular emphysema: Secondary | ICD-10-CM | POA: Diagnosis not present

## 2021-07-14 DIAGNOSIS — I10 Essential (primary) hypertension: Secondary | ICD-10-CM | POA: Diagnosis not present

## 2021-07-14 DIAGNOSIS — Z0000 Encounter for general adult medical examination without abnormal findings: Secondary | ICD-10-CM | POA: Diagnosis not present

## 2021-07-29 ENCOUNTER — Encounter: Payer: Self-pay | Admitting: Obstetrics and Gynecology

## 2021-07-29 ENCOUNTER — Ambulatory Visit: Payer: PPO | Admitting: Obstetrics and Gynecology

## 2021-07-29 VITALS — BP 130/80 | Ht 66.0 in | Wt 117.0 lb

## 2021-07-29 DIAGNOSIS — N3000 Acute cystitis without hematuria: Secondary | ICD-10-CM | POA: Diagnosis not present

## 2021-07-29 DIAGNOSIS — N6321 Unspecified lump in the left breast, upper outer quadrant: Secondary | ICD-10-CM | POA: Diagnosis not present

## 2021-07-29 DIAGNOSIS — N3010 Interstitial cystitis (chronic) without hematuria: Secondary | ICD-10-CM | POA: Diagnosis not present

## 2021-07-29 DIAGNOSIS — N644 Mastodynia: Secondary | ICD-10-CM | POA: Diagnosis not present

## 2021-07-29 LAB — POCT URINALYSIS DIPSTICK
Bilirubin, UA: NEGATIVE
Blood, UA: NEGATIVE
Glucose, UA: NEGATIVE
Ketones, UA: NEGATIVE
Nitrite, UA: NEGATIVE
Protein, UA: NEGATIVE
Spec Grav, UA: 1.015 (ref 1.010–1.025)
pH, UA: 6.5 (ref 5.0–8.0)

## 2021-07-29 MED ORDER — NITROFURANTOIN MONOHYD MACRO 100 MG PO CAPS: 100.0000 mg | ORAL_CAPSULE | Freq: Two times a day (BID) | ORAL | 0 refills | Status: AC

## 2021-07-29 NOTE — Progress Notes (Signed)
? ? ?[name], MD ? ? ?Chief Complaint  ?Patient presents with  ? Urinary Tract Infection  ? ? ?HPI: ?     [name] is a 81 y.o. [id number] whose LMP was No LMP recorded. Patient has had a hysterectomy., presents today for urinary frequency/urgency/nocturia for over a wk; also with LBP, questionable fever. No hematuria. Also with pelvic pain past 3 days, worse last night, sx improved with heating pad. Improved today. Hx of IC and doesn't do tx but follows some IC diet suggestions. Not sure if sx are IC vs UTI. Used to see Dr. Jacqlyn Larsen but no urology MD recently. Drinking tea. No PMB. Has done urispas in past for urinary frequency. ? ?Pt also with LT breast pains intermittently for past 2-3 months; pt feels possible mass at area of pain. Neg mammo 9/22. FH breast cancer in her MGM and mat aunt in their 40s/50s. ? ? ?Patient Active Problem List  ? Diagnosis Date Noted  ? Migraine 06/15/2017  ? Pelvic pain in female 06/15/2017  ? Personal history of ovarian cyst 06/15/2017  ? Reactive airway disease 12/28/2016  ? Low serum vitamin D 07/04/2016  ? Medicare annual wellness visit, initial 07/04/2016  ? 'light-for-dates' infant with signs of fetal malnutrition 12/30/2015  ? Hyperlipidemia, mixed 12/22/2015  ? Perforated bowel ([place]) 10/06/2014  ? Abdominal pain, acute, left lower quadrant 08/05/2014  ? Hematochezia 08/05/2014  ? Atrial tachycardia ([place]) 03/27/2014  ? Benign essential hypertension 08/30/2013  ? Ovarian cyst, bilateral 12/17/2012  ? Right lower quadrant pain 12/17/2012  ? Chronic cystitis 05/15/2012  ? Incomplete emptying of bladder 05/15/2012  ? Microscopic hematuria 05/15/2012  ? Mixed urge and stress incontinence 05/15/2012  ? Symptoms involving urinary system 05/15/2012  ? ? ?Past Surgical History:  ?Procedure Laterality Date  ? ABDOMINAL HYSTERECTOMY    ? BACK SURGERY  35 years  ? BOWEL RESECTION  09/09/2014  ? Procedure: , bladder instilation;  Surgeon: Dia Crawford III, MD;  Location: [facility]  ORS;  Service: General;;  ? CARDIAC CATHETERIZATION Right 09/09/2014  ? Procedure: CENTRAL LINE INSERTION;  Surgeon: Dia Crawford III, MD;  Location: [facility] ORS;  Service: General;  Laterality: Right;  ? CATARACT EXTRACTION W/ INTRAOCULAR LENS IMPLANT Right   ? CATARACT EXTRACTION W/PHACO Left 05/11/2016  ? Procedure: CATARACT EXTRACTION PHACO AND INTRAOCULAR LENS PLACEMENT (IOC);  Surgeon: Estill Cotta, MD;  Location: [facility] ORS;  Service: Ophthalmology;  Laterality: Left;  [place] 1:25 ?AP% 25.4 ?CDE 36.38 ?Fluid pack lot # [id number]  ? JOINT REPLACEMENT Right   ? LAPAROTOMY N/A 09/09/2014  ? Procedure: EXPLORATORY LAPAROTOMY, small bowel resection;  Surgeon: Dia Crawford III, MD;  Location: [facility] ORS;  Service: General;  Laterality: N/A;  ? NASAL SINUS SURGERY    ? ? ?Family History  ?Problem Relation Age of Onset  ? Heart disease Mother   ? Hypertension Mother   ? Breast cancer Paternal Aunt 68  ?     2 aunts  ? Breast cancer Paternal Grandmother 13  ? ? ?Social History  ? ?Socioeconomic History  ? Marital status: Married  ?  Spouse name: Not on file  ? Number of children: Not on file  ? Years of education: Not on file  ? Highest education level: Not on file  ?Occupational History  ? Not on file  ?Tobacco Use  ? Smoking status: Never  ? Smokeless tobacco: Never  ?Vaping Use  ? Vaping Use: Never used  ?Substance and Sexual Activity  ?  Alcohol use: No  ? Drug use: No  ? Sexual activity: Not on file  ?Other Topics Concern  ? Not on file  ?Social History Narrative  ? Not on file  ? ?Social Determinants of Health  ? ?Financial Resource Strain: Not on file  ?Food Insecurity: Not on file  ?Transportation Needs: Not on file  ?Physical Activity: Not on file  ?Stress: Not on file  ?Social Connections: Not on file  ?Intimate Partner Violence: Not on file  ? ? ?Outpatient Medications Prior to Visit  ?Medication Sig Dispense Refill  ? amLODipine (NORVASC) 5 MG tablet Take 5 mg by mouth daily.    ? aspirin 81 MG tablet Take 81 mg by mouth  daily.    ? flavoxATE (URISPAS) 100 MG tablet Take 1 tablet (100 mg total) by mouth 4 (four) times daily as needed for bladder spasms (bladder spasm). 50 tablet 3  ? metoprolol succinate (TOPROL-XL) 50 MG 24 hr tablet Take 50 mg by mouth 2 (two) times daily. Take with or immediately following a meal.    ? ?No facility-administered medications prior to visit.  ? ? ? ? ?ROS: ? ?Review of Systems  ?Constitutional:  Negative for fever.  ?Gastrointestinal:  Negative for blood in stool, constipation, diarrhea, nausea and vomiting.  ?Genitourinary:  Positive for frequency, pelvic pain and urgency. Negative for dyspareunia, dysuria, flank pain, hematuria, vaginal bleeding, vaginal discharge and vaginal pain.  ?Musculoskeletal:  Positive for back pain.  ?Skin:  Negative for rash.  ?BREAST: No symptoms ? ? ?OBJECTIVE:  ? ?Vitals:  ?BP 130/80   Ht '5\' 6"'$  (1.676 m)   Wt 117 lb (53.1 kg)   BMI 18.88 kg/m?  ? ?Physical Exam ?Vitals reviewed.  ?Constitutional:   ?   Appearance: She is well-developed. She is not ill-appearing or toxic-appearing.  ?Pulmonary:  ?   Effort: Pulmonary effort is normal.  ?Chest:  ?Breasts: ?   Breasts are symmetrical.  ?   Right: No inverted nipple, mass, nipple discharge, skin change or tenderness.  ?   Left: Mass and tenderness present. No inverted nipple, nipple discharge or skin change.  ? ? ?Abdominal:  ?   Palpations: Abdomen is soft.  ?   Tenderness: There is abdominal tenderness in the suprapubic area. There is no right CVA tenderness or left CVA tenderness.  ?Musculoskeletal:     ?   General: Normal range of motion.  ?   Cervical back: Normal range of motion.  ?Skin: ?   General: Skin is warm and dry.  ?Neurological:  ?   General: No focal deficit present.  ?   Mental Status: She is alert and oriented to person, place, and time.  ?   Cranial Nerves: No cranial nerve deficit.  ?Psychiatric:     ?   Mood and Affect: Mood normal.     ?   Behavior: Behavior normal.     ?   Thought Content:  Thought content normal.     ?   Judgment: Judgment normal.  ? ? ?Results: ?Results for orders placed or performed in visit on 07/29/21 (from the past 24 hour(s))  ?POCT Urinalysis Dipstick     Status: Abnormal  ? Collection Time: 07/29/21  9:57 AM  ?Result Value Ref Range  ? Color, UA yellow   ? Clarity, UA clear   ? Glucose, UA Negative Negative  ? Bilirubin, UA neg   ? Ketones, UA neg   ? Spec Grav, UA 1.015 1.010 - 1.025  ?  Blood, UA neg   ? pH, UA 6.5 5.0 - 8.0  ? Protein, UA Negative Negative  ? Urobilinogen, UA    ? Nitrite, UA neg   ? Leukocytes, UA Small (1+) (A) Negative  ? Appearance    ? Odor    ? ? ? ?Assessment/Plan: ?Acute cystitis without hematuria - Plan: nitrofurantoin, macrocrystal-monohydrate, (MACROBID) 100 MG capsule, Urine Culture, POCT Urinalysis Dipstick; pos sx and UA. Rx macrobid, check C&S. If neg, sx then most likely to be IC. Will refer to urology if sx persist and C&S neg.  ? ?Breast pain, left - Plan: US BREAST LTD UNI LEFT INC AXILLA, MM DIAG BREAST TOMO UNI LEFT; LT breast for 2-3 months, intermittent. Check dx mammo and u/s, pt to call to schedule. If neg, will cont to follow. D/C caffeine.  ? ?Mass of upper outer quadrant of left breast - Plan: US BREAST LTD UNI LEFT INC AXILLA, MM DIAG BREAST TOMO UNI LEFT ? ? ? ?Meds ordered this encounter  ?Medications  ? nitrofurantoin, macrocrystal-monohydrate, (MACROBID) 100 MG capsule  ?  Sig: Take 1 capsule (100 mg total) by mouth 2 (two) times daily for 5 days.  ?  Dispense:  10 capsule  ?  Refill:  0  ?  Order Specific Question:   Supervising Provider  ?  Answer:   CONSTANT, PEGGY [[id number]]  ? ? ? ? Return if symptoms worsen or fail to improve. ? ?Cylus Douville B. Venera Privott, PA-C ?07/29/2021 ?9:59 AM ? ? ? ? ? ?

## 2021-07-29 NOTE — Patient Instructions (Signed)
I value your feedback and you entrusting us with your care. If you get a [facility] patient survey, I would appreciate you taking the time to let us know about your experience today. Thank you! ? ? ?

## 2021-07-31 LAB — URINE CULTURE

## 2021-07-31 NOTE — Progress Notes (Signed)
Pls notify pt C&S neg so sx are IC. If still sx, can refer to urology.

## 2021-08-02 NOTE — Addendum Note (Signed)
Addended by: Ardeth Perfect B on: 8/41/3244 04:38 PM ? ? Modules accepted: Orders ? ?

## 2021-08-02 NOTE — Progress Notes (Signed)
Ref placed.

## 2021-08-18 ENCOUNTER — Ambulatory Visit
Admission: RE | Admit: 2021-08-18 | Discharge: 2021-08-18 | Disposition: A | Discharge status: Home / Self Care | Payer: PPO | Source: Ambulatory Visit | Attending: Obstetrics and Gynecology | Admitting: Obstetrics and Gynecology

## 2021-08-18 DIAGNOSIS — N644 Mastodynia: Secondary | ICD-10-CM

## 2021-08-18 DIAGNOSIS — N6321 Unspecified lump in the left breast, upper outer quadrant: Secondary | ICD-10-CM | POA: Insufficient documentation

## 2021-08-18 DIAGNOSIS — N6489 Other specified disorders of breast: Secondary | ICD-10-CM | POA: Diagnosis not present

## 2021-08-18 DIAGNOSIS — R928 Other abnormal and inconclusive findings on diagnostic imaging of breast: Secondary | ICD-10-CM | POA: Diagnosis not present

## 2021-09-13 ENCOUNTER — Encounter: Payer: Self-pay | Admitting: Urology

## 2021-09-13 ENCOUNTER — Ambulatory Visit: Payer: PPO | Admitting: Urology

## 2021-09-20 ENCOUNTER — Other Ambulatory Visit: Payer: Self-pay | Admitting: Obstetrics and Gynecology

## 2021-09-20 DIAGNOSIS — N3010 Interstitial cystitis (chronic) without hematuria: Secondary | ICD-10-CM

## 2021-09-20 NOTE — Progress Notes (Signed)
Ref to [facility] urology per pt request

## 2021-10-07 DIAGNOSIS — S51811A Laceration without foreign body of right forearm, initial encounter: Secondary | ICD-10-CM | POA: Diagnosis not present

## 2021-10-13 ENCOUNTER — Encounter: Payer: PPO | Attending: Physician Assistant | Admitting: Internal Medicine

## 2021-10-13 DIAGNOSIS — S41111A Laceration without foreign body of right upper arm, initial encounter: Secondary | ICD-10-CM | POA: Insufficient documentation

## 2021-10-13 DIAGNOSIS — X58XXXA Exposure to other specified factors, initial encounter: Secondary | ICD-10-CM | POA: Insufficient documentation

## 2021-10-13 DIAGNOSIS — S51801A Unspecified open wound of right forearm, initial encounter: Secondary | ICD-10-CM | POA: Insufficient documentation

## 2021-10-13 NOTE — Progress Notes (Signed)
Hannah [name] ([id number]) Visit Report for 10/13/2021 Chief Complaint Document Details Patient Name: Hannah [name], Hannah [name] Date of Service: 10/13/2021 10:00 AM Medical Record Number: [id number] Patient Account Number: [id number] Date of Birth/Sex: 07/30/1940 (81 y.o. Female) Treating RN: Levora Dredge Primary Care Provider: Emily Filbert Other Clinician: Referring Provider: Emily Filbert Treating Provider/Extender: Yaakov Guthrie in Treatment: 0 Information Obtained from: Patient Chief Complaint 10/13/2021; right forearm wound Electronic Signature(s) Signed: 10/13/2021 11:08:27 AM By: Kalman Shan DO Entered By: Kalman Shan on 10/13/2021 11:02:48 Pagaduan, Mazel J. ([id number]) -------------------------------------------------------------------------------- Debridement Details Patient Name: [name], [name] Date of Service: 10/13/2021 10:00 AM Medical Record Number: [id number] Patient Account Number: [id number] Date of Birth/Sex: December 25, 1940 (81 y.o. Female) Treating RN: Levora Dredge Primary Care Provider: Emily Filbert Other Clinician: Referring Provider: Emily Filbert Treating Provider/Extender: Yaakov Guthrie in Treatment: 0 Debridement Performed for Wound #1 Right Forearm Assessment: Performed By: Physician Kalman Shan, MD Debridement Type: Debridement Level of Consciousness (Pre- Awake and Alert procedure): Pre-procedure Verification/Time Out Yes - 10:34 Taken: Pain Control: Lidocaine 4% Topical Solution Total Area Debrided (L x W): 0.5 (cm) x 2.2 (cm) = 1.1 (cm) Tissue and other material Viable, Non-Viable, Slough, Subcutaneous, Slough debrided: Level: Skin/Subcutaneous Tissue Debridement Description: Excisional Instrument: Curette Bleeding: Minimum Hemostasis Achieved: Pressure Response to Treatment: Procedure was tolerated well Level of Consciousness (Post- Awake and Alert procedure): Post Debridement Measurements of Total  Wound Length: (cm) 0.5 Width: (cm) 2.2 Depth: (cm) 0.1 Volume: (cm) 0.086 Character of Wound/Ulcer Post Debridement: Stable Post Procedure Diagnosis Same as Pre-procedure Electronic Signature(s) Signed: 10/13/2021 11:08:27 AM By: Kalman Shan DO Signed: 10/13/2021 4:13:21 PM By: Levora Dredge Entered By: Levora Dredge on 10/13/2021 10:35:55 Rocca, [place] ([id number]) -------------------------------------------------------------------------------- HPI Details Patient Name: [name], [name] Date of Service: 10/13/2021 10:00 AM Medical Record Number: [id number] Patient Account Number: [id number] Date of Birth/Sex: Apr 02, 1941 (81 y.o. Female) Treating RN: Levora Dredge Primary Care Provider: Emily Filbert Other Clinician: Referring Provider: Emily Filbert Treating Provider/Extender: Yaakov Guthrie in Treatment: 0 History of Present Illness HPI Description: Admission 10/13/2021 Ms. Chea Malan is an 81 year old female with a Past medical history of essential hypertension that presents to the clinic for a 1 week history of open wound to her right forearm. She states she hit her arm against a dog crate Creating a laceration. The following day she went to urgent care for evaluation. They put her on Keflex for 7 days for which she states she completed. She has been using sterile gauze to the wound bed daily. She currently denies signs of infection. Electronic Signature(s) Signed: 10/13/2021 11:08:27 AM By: Kalman Shan DO Entered By: Kalman Shan on 10/13/2021 11:04:46 Arrellano, Sandrika J. ([id number]) -------------------------------------------------------------------------------- Physical Exam Details Patient Name: Hannah [name] Date of Service: 10/13/2021 10:00 AM Medical Record Number: [id number] Patient Account Number: [id number] Date of Birth/Sex: 08/19/1940 (81 y.o. Female) Treating RN: Levora Dredge Primary Care Provider: Emily Filbert Other  Clinician: Referring Provider: Emily Filbert Treating Provider/Extender: Yaakov Guthrie in Treatment: 0 Constitutional . Psychiatric . Notes Right forearm: Open wound with granulation tissue and nonviable tissue. Post-debridement there was healthy granulation tissue present. No signs of surrounding soft tissue infection. Electronic Signature(s) Signed: 10/13/2021 11:08:27 AM By: Kalman Shan DO Entered By: Kalman Shan on 10/13/2021 11:05:19 Waidelich, Kristine Linea ([id number]) -------------------------------------------------------------------------------- Physician Orders Details Patient Name: Kapler, Vesper J. Date of Service: 10/13/2021 10:00 AM Medical Record Number: [id number] Patient Account Number: [id number] Date of Birth/Sex: [birth year] (81 y.o. Female) Treating RN: Levora Dredge Primary Care  Provider: Emily Filbert Other Clinician: Referring Provider: Emily Filbert Treating Provider/Extender: Yaakov Guthrie in Treatment: 0 Verbal / Phone Orders: No Diagnosis Coding Follow-up Appointments o Return Appointment in 1 week. o Nurse Visit as needed [employer] o Wash wounds with antibacterial soap and water. o May shower; gently cleanse wound with antibacterial soap, rinse and pat dry prior to dressing wounds o No tub bath. Wound Treatment Wound #1 - Forearm Wound Laterality: Right Cleanser: Soap and Water 1 x Per XNT/70 Days Discharge Instructions: Gently cleanse wound with antibacterial soap, rinse and pat dry prior to dressing wounds Topical: Bacitracin Ointment, 1 (oz) tube 1 x Per Day/15 Days Primary Dressing: Non-Adherent Pad 3x8 (in/in) 1 x Per Day/15 Days Discharge Instructions: Add to wound bed to alleviate sticking. Secondary Dressing: Conforming Guaze Roll-Medium 1 x Per Day/15 Days Discharge Instructions: [place] as directed Secured With: Stretch Net Dressing, Latex-free, Size 5,  Small-Head / Shoulder / Thigh 1 x Per YFV/49 Days Electronic Signature(s) Signed: 10/13/2021 11:08:27 AM By: Kalman Shan DO Entered By: Kalman Shan on 10/13/2021 11:08:08 Bohac, Kameko J. ([id number]) -------------------------------------------------------------------------------- Problem List Details Patient Name: Rance, Sherrie J. Date of Service: 10/13/2021 10:00 AM Medical Record Number: [id number] Patient Account Number: [id number] Date of Birth/Sex: 1940-12-11 (81 y.o. Female) Treating RN: Levora Dredge Primary Care Provider: Emily Filbert Other Clinician: Referring Provider: Emily Filbert Treating Provider/Extender: Yaakov Guthrie in Treatment: 0 Active Problems ICD-10 Encounter Code Description Active Date MDM Diagnosis S51.801A Unspecified open wound of right forearm, initial encounter 10/13/2021 No Yes S41.111A Laceration without foreign body of right upper arm, initial encounter 10/13/2021 No Yes Inactive Problems Resolved Problems Electronic Signature(s) Signed: 10/13/2021 11:08:27 AM By: Kalman Shan DO Entered By: Kalman Shan on 10/13/2021 11:02:02 Guerin, Demaya J. ([id number]) -------------------------------------------------------------------------------- Progress Note Details Patient Name: Kostka, [name] Date of Service: 10/13/2021 10:00 AM Medical Record Number: [id number] Patient Account Number: [id number] Date of Birth/Sex: 04/27/1940 (81 y.o. Female) Treating RN: Levora Dredge Primary Care Provider: Emily Filbert Other Clinician: Referring Provider: Emily Filbert Treating Provider/Extender: Yaakov Guthrie in Treatment: 0 Subjective Chief Complaint Information obtained from Patient 10/13/2021; right forearm wound History of Present Illness (HPI) Admission 10/13/2021 Ms. [place] Huberty is an 81 year old female with a Past medical history of essential hypertension that presents to the clinic for a 1 week history of open  wound to her right forearm. She states she hit her arm against a dog crate Creating a laceration. The following day she went to urgent care for evaluation. They put her on Keflex for 7 days for which she states she completed. She has been using sterile gauze to the wound bed daily. She currently denies signs of infection. Patient History Information obtained from Patient. Allergies codeine (Reaction: hives), Dilaudid (Reaction: hives), meperidine (Reaction: nausea and vomiting), Vicodin (Reaction: hives) Social History Never smoker, Alcohol Use - Never, Drug Use - No History, Caffeine Use - Daily - coffee. 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 Eyes Denies complaints or symptoms of Dry Eyes, Vision Changes, Glasses / Contacts. Ear/Nose/Mouth/Throat Denies complaints or symptoms of Difficult clearing ears, Sinusitis. Hematologic/Lymphatic Denies complaints or symptoms of Bleeding / Clotting Disorders, Human Immunodeficiency Virus. Respiratory Denies complaints or symptoms of Chronic or frequent coughs, Shortness of  Breath. Gastrointestinal Denies complaints or symptoms of Frequent diarrhea, Nausea, Vomiting. Endocrine Denies complaints or symptoms of Hepatitis, Thyroid disease, Polydypsia (Excessive Thirst). Genitourinary Denies complaints or symptoms of Kidney failure/ Dialysis, Incontinence/dribbling. Immunological Denies complaints or symptoms of Hives, Itching. Integumentary (Skin) Denies complaints or symptoms of Wounds, Bleeding or bruising tendency, Breakdown, Swelling. Neurologic Denies complaints or symptoms of Numbness/parasthesias, Focal/Weakness. Oncologic skin  CA Psychiatric Denies complaints or symptoms of Anxiety, Claustrophobia. Siska, Aastha J. ([id number]) Objective Constitutional Vitals Time Taken: 10:15 AM, Height: 66 in, Source: Stated, Weight: 136 lbs, Source: Stated, BMI: 21.9, Temperature: 97.4 F, Pulse: 60 bpm, Respiratory Rate: 18 breaths/min, Blood Pressure: 124/48 mmHg. General Notes: Right forearm: Open wound with granulation tissue and nonviable tissue. Post-debridement there was healthy granulation tissue present. No signs of surrounding soft tissue infection. Integumentary (Hair, Skin) Wound #1 status is Open. Original cause of wound was Trauma. The date acquired was: 10/06/2021. The wound is located on the Right Forearm. The wound measures 0.5cm length x 2.2cm width x 0.1cm depth; 0.864cm^2 area and 0.086cm^3 volume. There is Fat Layer (Subcutaneous Tissue) exposed. There is no tunneling or undermining noted. There is a medium amount of serosanguineous drainage noted. There is small (1-33%) red granulation within the wound bed. There is a large (67-100%) amount of necrotic tissue within the wound bed including Adherent Slough. Assessment Active Problems ICD-10 Unspecified open wound of right forearm, initial encounter Laceration without foreign body of right upper arm, initial encounter Patient developed a wound 1 week ago from a laceration she experienced from a dog crate. She saw urgent care for this issue. She completed her course of Keflex that was given to her that time. She has been using sterile gauze to the wound bed. I debrided nonviable tissue. At this time I recommended antibiotic ointment and keeping the area covered. Changing this daily. No signs of surrounding infection. Follow-up in 1 week. 47 minutes was spent on the encounter including face-to-face, EMR review and coordination of care Procedures Wound #1 Pre-procedure diagnosis of Wound #1 is a Trauma, Other located on the Right Forearm . There was a  Excisional Skin/Subcutaneous Tissue Debridement with a total area of 1.1 sq cm performed by Kalman Shan, MD. With the following instrument(s): Curette to remove Viable and Non-Viable tissue/material. Material removed includes Subcutaneous Tissue and Slough and after achieving pain control using Lidocaine 4% Topical Solution. No specimens were taken. A time out was conducted at 10:34, prior to the start of the procedure. A Minimum amount of bleeding was controlled with Pressure. The procedure was tolerated well. Post Debridement Measurements: 0.5cm length x 2.2cm width x 0.1cm depth; 0.086cm^3 volume. Character of Wound/Ulcer Post Debridement is stable. Post procedure Diagnosis Wound #1: Same as Pre-Procedure Plan Follow-up Appointments: Return Appointment in 1 week. Nurse Visit as needed Bathing/ Shower/ Hygiene: Wash wounds with antibacterial soap and water. May shower; gently cleanse wound with antibacterial soap, rinse and pat dry prior to dressing wounds No tub bath. WOUND #1: - Forearm Wound Laterality: Right [place], Jnaya J. ([id number]) Cleanser: Soap and Water 1 x Per Day/15 Days Discharge Instructions: Gently cleanse wound with antibacterial soap, rinse and pat dry prior to dressing wounds Topical: Bacitracin Ointment, 1 (oz) tube 1 x Per Day/15 Days Primary Dressing: Non-Adherent Pad 3x8 (in/in) 1 x Per Day/15 Days Discharge Instructions: Add to wound bed to alleviate sticking. Secondary Dressing: Conforming Guaze Roll-Medium 1 x Per Day/15 Days Discharge Instructions: Apply Conforming Stretch Guaze Bandage as directed Secured With: [employer],  Latex-free, Size 5, Small-Head / Shoulder / Thigh 1 x Per Day/15 Days 1. In office sharp debridement 2. Bacitracin daily 3. Follow-up in 1 week Electronic Signature(s) Signed: 10/13/2021 11:08:27 AM By: Kalman Shan DO Entered By: Kalman Shan on 10/13/2021 11:07:30 Dewberry, Kristine Linea  ([id number]) -------------------------------------------------------------------------------- ROS/PFSH Details Patient Name: Copelin, [name] Date of Service: 10/13/2021 10:00 AM Medical Record Number: [id number] Patient Account Number: [id number] Date of Birth/Sex: 1940-10-18 (81 y.o. Female) Treating RN: Levora Dredge Primary Care Provider: Emily Filbert Other Clinician: Referring Provider: Emily Filbert Treating Provider/Extender: Yaakov Guthrie in Treatment: 0 Information Obtained From Patient Constitutional Symptoms (General Health) Complaints and Symptoms: Negative for: Fatigue; Fever; Chills; Marked Weight Change Eyes Complaints and Symptoms: Negative for: Dry Eyes; Vision Changes; Glasses / Contacts Ear/Nose/Mouth/Throat Complaints and Symptoms: Negative for: Difficult clearing ears; Sinusitis Hematologic/Lymphatic Complaints and Symptoms: Negative for: Bleeding / Clotting Disorders; Human Immunodeficiency Virus Respiratory Complaints and Symptoms: Negative for: Chronic or frequent coughs; Shortness of Breath Medical History: Positive for: Asthma Gastrointestinal Complaints and Symptoms: Negative for: Frequent diarrhea; Nausea; Vomiting Endocrine Complaints and Symptoms: Negative for: Hepatitis; Thyroid disease; Polydypsia (Excessive Thirst) Genitourinary Complaints and Symptoms: Negative for: Kidney failure/ Dialysis; Incontinence/dribbling Immunological Complaints and Symptoms: Negative for: Hives; Itching Integumentary (Skin) Complaints and Symptoms: Negative for: Wounds; Bleeding or bruising tendency; Breakdown; Swelling Neurologic Pickford, Dalal J. ([id number]) Complaints and Symptoms: Negative for: Numbness/parasthesias; Focal/Weakness Psychiatric Complaints and Symptoms: Negative for: Anxiety; Claustrophobia Cardiovascular Medical History: Positive for: Arrhythmia - a fib; Hypertension Musculoskeletal Medical History: Past Medical  History Notes: arthritis in bilat hands Oncologic Complaints and Symptoms: Review of System Notes: skin [place] Immunizations Pneumococcal Vaccine: Received Pneumococcal Vaccination: Yes Received Pneumococcal Vaccination On or After 60th Birthday: Yes Implantable Devices None Family and Social History Never smoker; Alcohol Use: Never; Drug Use: No History; Caffeine Use: Daily - coffee Electronic Signature(s) Signed: 10/13/2021 11:08:27 AM By: Kalman Shan DO Signed: 10/13/2021 4:13:21 PM By: Levora Dredge Entered By: Levora Dredge on 10/13/2021 10:21:35 Ozbun, Avanell J. ([id number]) -------------------------------------------------------------------------------- SuperBill Details Patient Name: Hannah [name], [name] Date of Service: 10/13/2021 Medical Record Number: [id number] Patient Account Number: [id number] Date of Birth/Sex: [birth year] (81 y.o. Female) Treating RN: Levora Dredge Primary Care Provider: Emily Filbert Other Clinician: Referring Provider: Emily Filbert Treating Provider/Extender: Yaakov Guthrie in Treatment: 0 Diagnosis Coding ICD-10 Codes Code Description G62.694W Unspecified open wound of right forearm, initial encounter S41.111A Laceration without foreign body of right upper arm, initial encounter Facility Procedures CPT4 Code: 54627035 Description: [place] VISIT-LEV 3 EST PT Modifier: Quantity: 1 CPT4 Code: [id number] Description: 99371 - DEB SUBQ TISSUE 20 SQ CM/< Modifier: Quantity: 1 CPT4 Code: Description: ICD-10 Diagnosis Description I96.789F Unspecified open wound of right forearm, initial encounter S41.111A Laceration without foreign body of right upper arm, initial encounter Modifier: Quantity: Physician Procedures CPT4 Code: 8101751 Description: 02585 - WC PHYS LEVEL 4 - NEW PT Modifier: Quantity: 1 CPT4 Code: Description: ICD-10 Diagnosis Description I77.824M Unspecified open wound of right forearm, initial encounter  S41.111A Laceration without foreign body of right upper arm, initial encounter Modifier: Quantity: CPT4 Code: [id number] Description: 31540 - WC PHYS SUBQ TISS 20 SQ CM Modifier: Quantity: 1 CPT4 Code: Description: ICD-10 Diagnosis Description G86.761P Unspecified open wound of right forearm, initial encounter S41.111A Laceration without foreign body of right upper arm, initial encounter Modifier: Quantity: Electronic Signature(s) Signed: 10/13/2021 11:08:27 AM By: Kalman Shan DO Entered By: Kalman Shan on 10/13/2021 11:07:53

## 2021-10-13 NOTE — Progress Notes (Signed)
Hannah [name], Hannah [name] ([id number]) Visit Report for 10/13/2021 Abuse Risk Screen Details Patient Name: Hannah [name], Hannah [name] Date of Service: 10/13/2021 10:00 AM Medical Record Number: [id number] Patient Account Number: [id number] Date of Birth/Sex: [birth year] (81 y.o. Female) Treating RN: Levora Dredge Primary Care Suhan Paci: Emily Filbert Other Clinician: Referring Merranda Bolls: Emily Filbert Treating Aemon Koeller/Extender: Yaakov Guthrie in Treatment: 0 Abuse Risk Screen Items Answer ABUSE RISK SCREEN: Has anyone close to you tried to hurt or harm you recentlyo No Do you feel uncomfortable with anyone in your familyo No Has anyone forced you do things that you didnot want to doo No Electronic Signature(s) Signed: 10/13/2021 4:13:21 PM By: Levora Dredge Entered By: Levora Dredge on 10/13/2021 10:21:46 Hannah [name], [name] ([id number]) -------------------------------------------------------------------------------- Activities of Daily Living Details Patient Name: Hannah [name], Hannah [name] Date of Service: 10/13/2021 10:00 AM Medical Record Number: [id number] Patient Account Number: [id number] Date of Birth/Sex: June 02, 1940 (81 y.o. Female) Treating RN: Levora Dredge Primary Care Bartt Gonzaga: Emily Filbert Other Clinician: Referring Melika Reder: Emily Filbert Treating Decari Duggar/Extender: Yaakov Guthrie in Treatment: 0 Activities of Daily Living Items Answer Activities of Daily Living (Please select one for each item) Drive Automobile Completely Able Take Medications Completely Able Use Telephone Completely Able Care for Appearance Completely Able Use Toilet Completely Able Bath / Shower Completely Able Dress Self Completely Able Feed Self Completely Able Walk Completely Able Get In / Out Bed Completely Able Housework Completely Able Prepare Meals Completely Able Handle Money Completely Able Shop for Self Completely Able Electronic Signature(s) Signed: 10/13/2021 4:13:21 PM By: Levora Dredge Entered By: Levora Dredge on 10/13/2021 10:22:05 Hannah [name], [name] ([id number]) -------------------------------------------------------------------------------- Education Screening Details Patient Name: Hannah [name], [name] Date of Service: 10/13/2021 10:00 AM Medical Record Number: [id number] Patient Account Number: [id number] Date of Birth/Sex: 1940-12-27 (81 y.o. Female) Treating RN: Levora Dredge Primary Care Deron Poole: Emily Filbert Other Clinician: Referring Aisling Emigh: Emily Filbert Treating Herman Mell/Extender: Yaakov Guthrie in Treatment: 0 Learning Preferences/Education Level/Primary Language Learning Preference: Explanation, Demonstration, Video, Communication Board, Printed Material Preferred Language: English Cognitive Barrier Language Barrier: No Translator Needed: No Memory Deficit: No Emotional Barrier: No Cultural/Religious Beliefs Affecting Medical Care: No Physical Barrier Impaired Vision: No Impaired Hearing: No Decreased Hand dexterity: No Knowledge/Comprehension Knowledge Level: High Comprehension Level: High Ability to understand written instructions: High Ability to understand verbal instructions: High Motivation Anxiety Level: Calm Cooperation: Cooperative Education Importance: Acknowledges Need Interest in Health Problems: Asks Questions Perception: Coherent Willingness to Engage in Self-Management High Activities: Readiness to Engage in Self-Management High Activities: Electronic Signature(s) Signed: 10/13/2021 4:13:21 PM By: Levora Dredge Entered By: Levora Dredge on 10/13/2021 10:22:25 Hannah [name], Hannah [name] ([id number]) -------------------------------------------------------------------------------- Fall Risk Assessment Details Patient Name: Hannah [name], Hannah [name] Date of Service: 10/13/2021 10:00 AM Medical Record Number: [id number] Patient Account Number: [id number] Date of Birth/Sex: 21-Nov-1940 (81 y.o. Female) Treating RN:  Levora Dredge Primary Care Knolan Simien: Emily Filbert Other Clinician: Referring Merly Hinkson: Emily Filbert Treating Aryan Sparks/Extender: Yaakov Guthrie in Treatment: 0 Fall Risk Assessment Items Have you had 2 or more falls in the last 12 monthso 0 No Have you had any fall that resulted in injury in the last 12 monthso 0 No FALLS RISK SCREEN History of falling - immediate or within 3 months 0 No Secondary diagnosis (Do you have 2 or more medical diagnoseso) 0 No Ambulatory aid None/bed rest/wheelchair/nurse 0 Yes Crutches/cane/walker 0 No Furniture 0 No Intravenous therapy Access/Saline/Heparin Lock 0 No Gait/Transferring Normal/ bed rest/ wheelchair 0 Yes Weak (short steps with or without shuffle, stooped but able to  lift head while walking, may 0 No seek support from furniture) Impaired (short steps with shuffle, may have difficulty arising from chair, head down, impaired 0 No balance) Mental Status Oriented to own ability 0 Yes Electronic Signature(s) Signed: 10/13/2021 4:13:21 PM By: Levora Dredge Entered By: Levora Dredge on 10/13/2021 10:22:33 Hannah [name], [name] ([id number]) -------------------------------------------------------------------------------- Foot Assessment Details Patient Name: Hannah [name], Hannah [name] Date of Service: 10/13/2021 10:00 AM Medical Record Number: [id number] Patient Account Number: [id number] Date of Birth/Sex: 03-17-1941 (81 y.o. Female) Treating RN: Levora Dredge Primary Care Narya Beavin: Emily Filbert Other Clinician: Referring Parag Dorton: Emily Filbert Treating Tzipporah Nagorski/Extender: Yaakov Guthrie in Treatment: 0 Foot Assessment Items Site Locations + = Sensation present, - = Sensation absent, C = Callus, U = Ulcer R = Redness, W = Warmth, M = Maceration, PU = Pre-ulcerative lesion F = Fissure, S = Swelling, D = Dryness Assessment Right: Left: Other Deformity: No No Prior Foot Ulcer: No No Prior Amputation: No No Charcot Joint: No  No Ambulatory Status: Gait: Notes not completed due to location of wound on right forearm Electronic Signature(s) Signed: 10/13/2021 4:13:21 PM By: Levora Dredge Entered By: Levora Dredge on 10/13/2021 10:22:57 Hannah [name], [name] ([id number]) -------------------------------------------------------------------------------- Nutrition Risk Screening Details Patient Name: Hannah [name], Hannah [name] Date of Service: 10/13/2021 10:00 AM Medical Record Number: [id number] Patient Account Number: [id number] Date of Birth/Sex: 1940-04-16 (81 y.o. Female) Treating RN: Levora Dredge Primary Care Monet North: Emily Filbert Other Clinician: Referring Ulrick Methot: Emily Filbert Treating Murry Diaz/Extender: Yaakov Guthrie in Treatment: 0 Height (in): 66 Weight (lbs): 136 Body Mass Index (BMI): 21.9 Nutrition Risk Screening Items Score Screening NUTRITION RISK SCREEN: I have an illness or condition that made me change the kind and/or amount of food I eat 0 No I eat fewer than two meals per day 0 No I eat few fruits and vegetables, or milk products 0 No I have three or more drinks of beer, liquor or wine almost every day 0 No I have tooth or mouth problems that make it hard for me to eat 0 No I don't always have enough money to buy the food I need 0 No I eat alone most of the time 0 No I take three or more different prescribed or over-the-counter drugs a day 0 No Without wanting to, I have lost or gained 10 pounds in the last six months 0 No I am not always physically able to shop, cook and/or feed myself 0 No Nutrition Protocols Good Risk Protocol 0 No interventions needed Moderate Risk Protocol High Risk Proctocol Risk Level: Good Risk Score: 0 Electronic Signature(s) Signed: 10/13/2021 4:13:21 PM By: Levora Dredge Entered By: Levora Dredge on 10/13/2021 10:22:41

## 2021-10-13 NOTE — Progress Notes (Signed)
Hannah [name], Hannah [name] ([id number]) Visit Report for 10/13/2021 Allergy List Details Patient Name: Hannah [name], Hannah [name] Date of Service: 10/13/2021 10:00 AM Medical Record Number: [id number] Patient Account Number: [id number] Date of Birth/Sex: [birth year] (81 y.o. Female) Treating RN: Levora Dredge Primary Care Syesha Thaw: Emily Filbert Other Clinician: Referring Jaedynn Bohlken: Emily Filbert Treating Lavone Barrientes/Extender: Yaakov Guthrie in Treatment: 0 Allergies Active Allergies codeine Reaction: hives Dilaudid Reaction: hives meperidine Reaction: nausea and vomiting Vicodin Reaction: hives Allergy Notes Electronic Signature(s) Signed: 10/13/2021 4:13:21 PM By: Levora Dredge Entered By: Levora Dredge on 10/13/2021 10:19:14 Claunch, Laurielle J. ([id number]) -------------------------------------------------------------------------------- Arrival Information Details Patient Name: Hannah [name], [name] Date of Service: 10/13/2021 10:00 AM Medical Record Number: [id number] Patient Account Number: [id number] Date of Birth/Sex: 09/13/1940 (81 y.o. Female) Treating RN: Levora Dredge Primary Care Deania Siguenza: Emily Filbert Other Clinician: Referring Kyilee Gregg: Emily Filbert Treating Roza Creamer/Extender: Yaakov Guthrie in Treatment: 0 Visit Information Patient Arrived: Ambulatory Arrival Time: 10:14 Accompanied By: self Transfer Assistance: None Patient Identification Verified: Yes Secondary Verification Process Completed: Yes Patient Has Alerts: Yes Patient Alerts: Patient on Blood Thinner Electronic Signature(s) Signed: 10/13/2021 11:04:54 AM By: Levora Dredge Entered By: Levora Dredge on 10/13/2021 11:04:54 Okuda, Rejeana J. ([id number]) -------------------------------------------------------------------------------- Clinic Level of Care Assessment Details Patient Name: Hannah [name], [name] Date of Service: 10/13/2021 10:00 AM Medical Record Number: [id number] Patient Account  Number: [id number] Date of Birth/Sex: Apr 16, 1940 (81 y.o. Female) Treating RN: Levora Dredge Primary Care Ebany Bowermaster: Emily Filbert Other Clinician: Referring Alexyia Guarino: Emily Filbert Treating Trason Shifflet/Extender: Yaakov Guthrie in Treatment: 0 Clinic Level of Care Assessment Items TOOL 1 Quantity Score '[]'$  - Use when EandM and Procedure is performed on INITIAL visit 0 ASSESSMENTS - Nursing Assessment / Reassessment X - General Physical Exam (combine w/ comprehensive assessment (listed just below) when performed on new 1 20 pt. evals) X- 1 25 Comprehensive Assessment (HX, ROS, Risk Assessments, Wounds Hx, etc.) ASSESSMENTS - Wound and Skin Assessment / Reassessment '[]'$  - Dermatologic / Skin Assessment (not related to wound area) 0 ASSESSMENTS - Ostomy and/or Continence Assessment and Care '[]'$  - Incontinence Assessment and Management 0 '[]'$  - 0 Ostomy Care Assessment and Management (repouching, etc.) PROCESS - Coordination of Care X - Simple Patient / Family Education for ongoing care 1 15 '[]'$  - 0 Complex (extensive) Patient / Family Education for ongoing care X- 1 10 Staff obtains Programmer, systems, Records, Test Results / Process Orders '[]'$  - 0 Staff telephones HHA, Nursing Homes / Clarify orders / etc '[]'$  - 0 Routine Transfer to another Facility (non-emergent condition) '[]'$  - 0 Routine Hospital Admission (non-emergent condition) X- 1 15 New Admissions / Biomedical engineer / Ordering NPWT, Apligraf, etc. '[]'$  - 0 Emergency Hospital Admission (emergent condition) PROCESS - Special Needs '[]'$  - Pediatric / Minor Patient Management 0 '[]'$  - 0 Isolation Patient Management '[]'$  - 0 Hearing / Language / Visual special needs '[]'$  - 0 Assessment of Community assistance (transportation, D/C planning, etc.) '[]'$  - 0 Additional assistance / Altered mentation '[]'$  - 0 Support Surface(s) Assessment (bed, cushion, seat, etc.) INTERVENTIONS - Miscellaneous '[]'$  - External ear exam 0 '[]'$  - 0 Patient  Transfer (multiple staff / Civil Service fast streamer / Similar devices) '[]'$  - 0 Simple Staple / Suture removal (25 or less) '[]'$  - 0 Complex Staple / Suture removal (26 or more) '[]'$  - 0 Hypo/Hyperglycemic Management (do not check if billed separately) '[]'$  - 0 Ankle / Brachial Index (ABI) - do not check if billed separately Has the patient been seen at the hospital within the last  three years: Yes Total Score: 85 Level Of Care: New/Established - Level 3 Carrick, Harsimran J. ([id number]) Electronic Signature(s) Signed: 10/13/2021 4:13:21 PM By: Levora Dredge Entered By: Levora Dredge on 10/13/2021 10:45:39 Kirsh, Kynzli J. ([id number]) -------------------------------------------------------------------------------- Encounter Discharge Information Details Patient Name: Hannah [name], [name] Date of Service: 10/13/2021 10:00 AM Medical Record Number: [id number] Patient Account Number: [id number] Date of Birth/Sex: 1940/11/05 (81 y.o. Female) Treating RN: Levora Dredge Primary Care Keta Van[place]: Emily Filbert Other Clinician: Referring Braidyn Peace: Emily Filbert Treating Marysa Wessner/Extender: Yaakov Guthrie in Treatment: 0 Encounter Discharge Information Items Post Procedure Vitals Discharge Condition: Stable Temperature (F): 97.4 Ambulatory Status: Ambulatory Pulse (bpm): 60 Discharge Destination: Home Respiratory Rate (breaths/min): 18 Transportation: Private Auto Blood Pressure (mmHg): 124/48 Accompanied By: self Schedule Follow-up Appointment: Yes Clinical Summary of Care: Electronic Signature(s) Signed: 10/13/2021 4:13:21 PM By: Levora Dredge Entered By: Levora Dredge on 10/13/2021 10:47:40 Fazzini, Kristine Linea ([id number]) -------------------------------------------------------------------------------- Lower Extremity Assessment Details Patient Name: Hannah [name], Hannah [name] Date of Service: 10/13/2021 10:00 AM Medical Record Number: [id number] Patient Account Number: [id number] Date of  Birth/Sex: 1940/12/30 (81 y.o. Female) Treating RN: Levora Dredge Primary Care Elna Radovich: Emily Filbert Other Clinician: Referring Esias Mory: Emily Filbert Treating Airon Sahni/Extender: Yaakov Guthrie in Treatment: 0 Electronic Signature(s) Signed: 10/13/2021 4:13:21 PM By: Levora Dredge Entered By: Levora Dredge on 10/13/2021 10:23:09 Crego, Hildagard J. ([id number]) -------------------------------------------------------------------------------- Multi Wound Chart Details Patient Name: Hannah [name], [name] Date of Service: 10/13/2021 10:00 AM Medical Record Number: [id number] Patient Account Number: [id number] Date of Birth/Sex: 1940/07/31 (81 y.o. Female) Treating RN: Levora Dredge Primary Care Rionna Feltes: Emily Filbert Other Clinician: Referring Deontae Robson: Emily Filbert Treating Gianna Calef/Extender: Yaakov Guthrie in Treatment: 0 Vital Signs Height(in): 66 Pulse(bpm): 60 Weight(lbs): 136 Blood Pressure(mmHg): 124/48 Body Mass Index(BMI): 21.9 Temperature(F): 97.4 Respiratory Rate(breaths/min): 18 Photos: [N/A:N/A] Wound Location: Right Forearm N/A N/A Wounding Event: Trauma N/A N/A Primary Etiology: Trauma, Other N/A N/A Comorbid History: Asthma, Arrhythmia, Hypertension N/A N/A Date Acquired: 10/06/2021 N/A N/A Weeks of Treatment: 0 N/A N/A Wound Status: Open N/A N/A Wound Recurrence: No N/A N/A Measurements L x W x D (cm) 0.5x2.2x0.1 N/A N/A Area (cm) : 0.864 N/A N/A Volume (cm) : 0.086 N/A N/A Classification: Full Thickness Without Exposed N/A N/A Support Structures Exudate Amount: Medium N/A N/A Exudate Type: Serosanguineous N/A N/A Exudate Color: red, brown N/A N/A Granulation Amount: Small (1-33%) N/A N/A Granulation Quality: Red N/A N/A Necrotic Amount: Large (67-100%) N/A N/A Exposed Structures: Fat Layer (Subcutaneous Tissue): N/A N/A Yes Epithelialization: Small (1-33%) N/A N/A Debridement: Debridement - Excisional N/A N/A Pre-procedure  Verification/Time 10:34 N/A N/A Out Taken: Pain Control: Lidocaine 4% Topical Solution N/A N/A Tissue Debrided: Subcutaneous, Slough N/A N/A Level: Skin/Subcutaneous Tissue N/A N/A Debridement Area (sq cm): 1.1 N/A N/A Instrument: Curette N/A N/A Bleeding: Minimum N/A N/A Hemostasis Achieved: Pressure N/A N/A Debridement Treatment Procedure was tolerated well N/A N/A Response: Post Debridement 0.5x2.2x0.1 N/A N/A Measurements L x W x D (cm) Post Debridement Volume: 0.086 N/A N/A (cm) Procedures Performed: Debridement N/A N/A Treatment Notes Paar, Chyan J. ([id number]) Wound #1 (Forearm) Wound Laterality: Right Cleanser Soap and Water Discharge Instruction: Gently cleanse wound with antibacterial soap, rinse and pat dry prior to dressing wounds Peri-Wound Care Topical Bacitracin Ointment, 1 (oz) tube Primary Dressing Non-Adherent Pad 3x8 (in/in) Discharge Instruction: Add to wound bed to alleviate sticking. Secondary Dressing [place] Roll-Medium Discharge Instruction: Apply Conforming Stretch Guaze Bandage as directed Secured With [employer] Dressing, Latex-free, Size 5, Small-Head / Shoulder / Thigh Compression Wrap Compression  Stockings Environmental education officer) Signed: 10/13/2021 11:08:27 AM By: Kalman Shan DO Entered By: Kalman Shan on 10/13/2021 11:02:07 Hanahan, Kristine Linea ([id number]) -------------------------------------------------------------------------------- Multi-Disciplinary Care Plan Details Patient Name: Hannah [name], Hannah [name] Date of Service: 10/13/2021 10:00 AM Medical Record Number: [id number] Patient Account Number: [id number] Date of Birth/Sex: 12-Dec-1940 (81 y.o. Female) Treating RN: Levora Dredge Primary Care Arshiya Jakes: Emily Filbert Other Clinician: Referring Evgenia Merriman: Emily Filbert Treating Karlis Cregg/Extender: Yaakov Guthrie in Treatment: 0 Active Inactive Orientation to the Wound Care Program Nursing  Diagnoses: Knowledge deficit related to the wound healing center program Goals: Patient/caregiver will verbalize understanding of the [facility] Program Date Initiated: 10/13/2021 Target Resolution Date: 10/20/2021 Goal Status: Active Interventions: Provide education on orientation to the wound center Notes: Wound/Skin Impairment Nursing Diagnoses: Impaired tissue integrity Knowledge deficit related to ulceration/compromised skin integrity Goals: Ulcer/skin breakdown will have a volume reduction of 30% by week 4 Date Initiated: 10/13/2021 Target Resolution Date: 11/10/2021 Goal Status: Active Ulcer/skin breakdown will have a volume reduction of 50% by week 8 Date Initiated: 10/13/2021 Target Resolution Date: 12/08/2021 Goal Status: Active Ulcer/skin breakdown will have a volume reduction of 80% by week 12 Date Initiated: 10/13/2021 Target Resolution Date: 01/05/2022 Goal Status: Active Ulcer/skin breakdown will heal within 14 weeks Date Initiated: 10/13/2021 Target Resolution Date: 01/19/2022 Goal Status: Active Interventions: Assess patient/caregiver ability to obtain necessary supplies Assess patient/caregiver ability to perform ulcer/skin care regimen upon admission and as needed Assess ulceration(s) every visit Provide education on ulcer and skin care Notes: Electronic Signature(s) Signed: 10/13/2021 4:13:21 PM By: Levora Dredge Entered By: Levora Dredge on 10/13/2021 10:33:43 Hannah [name], [name] ([id number]) -------------------------------------------------------------------------------- Pain Assessment Details Patient Name: Hannah [name], Hannah [name] Date of Service: 10/13/2021 10:00 AM Medical Record Number: [id number] Patient Account Number: [id number] Date of Birth/Sex: [birth year] (81 y.o. Female) Treating RN: Levora Dredge Primary Care Zanita Millman: Emily Filbert Other Clinician: Referring Sevanna Ballengee: Emily Filbert Treating Amillion Scobee/Extender: Yaakov Guthrie in  Treatment: 0 Active Problems Location of Pain Severity and Description of Pain Patient Has Paino No Site Locations Rate the pain. Current Pain Level: 0 Pain Management and Medication Current Pain Management: Electronic Signature(s) Signed: 10/13/2021 4:13:21 PM By: Levora Dredge Entered By: Levora Dredge on 10/13/2021 10:14:49 Prehn, Kristine Linea ([id number]) -------------------------------------------------------------------------------- Patient/Caregiver Education Details Patient Name: Hannah [name], [name] Date of Service: 10/13/2021 10:00 AM Medical Record Number: [id number] Patient Account Number: [id number] Date of Birth/Gender: [birth year] (81 y.o. Female) Treating RN: Levora Dredge Primary Care Physician: Emily Filbert Other Clinician: Referring Physician: Emily Filbert Treating Physician/Extender: Yaakov Guthrie in Treatment: 0 Education Assessment Education Provided To: Patient Education Topics Provided Welcome To [facility]: Handouts: Welcome To [facility] Methods: Explain/Verbal Responses: State content correctly Wound/Skin Impairment: Handouts: Caring for Your Ulcer Methods: Explain/Verbal Responses: State content correctly Electronic Signature(s) Signed: 10/13/2021 4:13:21 PM By: Levora Dredge Entered By: Levora Dredge on 10/13/2021 10:45:57 Hannah [name], Nandi J. ([id number]) -------------------------------------------------------------------------------- Wound Assessment Details Patient Name: Hannah [name], Hannah [name] Date of Service: 10/13/2021 10:00 AM Medical Record Number: [id number] Patient Account Number: [id number] Date of Birth/Sex: 07-11-1940 (81 y.o. Female) Treating RN: Levora Dredge Primary Care Shivani Barrantes: Emily Filbert Other Clinician: Referring Moesha Sarchet: Emily Filbert Treating Maybelle Depaoli/Extender: Yaakov Guthrie in Treatment: 0 Wound Status Wound Number: 1 Primary Etiology: Trauma, Other Wound Location: Right  Forearm Wound Status: Open Wounding Event: Trauma Comorbid History: Asthma, Arrhythmia, Hypertension Date Acquired: 10/06/2021 Weeks Of Treatment: 0 Clustered Wound: No Photos Wound Measurements Length: (cm) 0.5 Width: (cm) 2.2 Depth: (cm) 0.1 Area: (cm) 0.864  Volume: (cm) 0.086 % Reduction in Area: % Reduction in Volume: Epithelialization: Small (1-33%) Tunneling: No Undermining: No Wound Description Classification: Full Thickness Without Exposed Support Structu Exudate Amount: Medium Exudate Type: Serosanguineous Exudate Color: red, brown res Foul Odor After Cleansing: No Slough/Fibrino Yes Wound Bed Granulation Amount: Small (1-33%) Exposed Structure Granulation Quality: Red Fat Layer (Subcutaneous Tissue) Exposed: Yes Necrotic Amount: Large (67-100%) Necrotic Quality: Adherent Slough Treatment Notes Wound #1 (Forearm) Wound Laterality: Right Cleanser Soap and Water Discharge Instruction: Gently cleanse wound with antibacterial soap, rinse and pat dry prior to dressing wounds Peri-Wound Care Topical Bacitracin Ointment, 1 (oz) tube Primary Dressing Dibenedetto, Ercia J. ([id number]) Non-Adherent Pad 3x8 (in/in) Discharge Instruction: Add to wound bed to alleviate sticking. Secondary Dressing [place] Roll-Medium Discharge Instruction: Apply Conforming Stretch Guaze Bandage as directed Secured With [employer] Dressing, Latex-free, Size 5, Small-Head / Shoulder / Thigh Compression Wrap Compression Stockings Add-Ons Electronic Signature(s) Signed: 10/13/2021 4:13:21 PM By: Levora Dredge Entered By: Levora Dredge on 10/13/2021 10:29:18 Pucillo, Kristine Linea ([id number]) -------------------------------------------------------------------------------- Vitals Details Patient Name: Hannah [name], [name] Date of Service: 10/13/2021 10:00 AM Medical Record Number: [id number] Patient Account Number: [id number] Date of Birth/Sex: 08-Nov-1940 (81 y.o.  Female) Treating RN: Levora Dredge Primary Care Vanassa Penniman: Emily Filbert Other Clinician: Referring Himani Corona: Emily Filbert Treating Terrel Nesheiwat/Extender: Yaakov Guthrie in Treatment: 0 Vital Signs Time Taken: 10:15 Temperature (F): 97.4 Height (in): 66 Pulse (bpm): 60 Source: Stated Respiratory Rate (breaths/min): 18 Weight (lbs): 136 Blood Pressure (mmHg): 124/48 Source: Stated Reference Range: 80 - 120 mg / dl Body Mass Index (BMI): 21.9 Electronic Signature(s) Signed: 10/13/2021 4:13:21 PM By: Levora Dredge Entered By: Levora Dredge on 10/13/2021 10:17:47

## 2021-10-18 DIAGNOSIS — E782 Mixed hyperlipidemia: Secondary | ICD-10-CM | POA: Diagnosis not present

## 2021-10-18 DIAGNOSIS — I10 Essential (primary) hypertension: Secondary | ICD-10-CM | POA: Diagnosis not present

## 2021-10-18 DIAGNOSIS — I471 Supraventricular tachycardia: Secondary | ICD-10-CM | POA: Diagnosis not present

## 2021-10-19 ENCOUNTER — Ambulatory Visit: Payer: PPO | Admitting: Physician Assistant

## 2021-10-20 ENCOUNTER — Encounter (HOSPITAL_BASED_OUTPATIENT_CLINIC_OR_DEPARTMENT_OTHER): Payer: PPO | Admitting: Internal Medicine

## 2021-10-20 DIAGNOSIS — S41111A Laceration without foreign body of right upper arm, initial encounter: Secondary | ICD-10-CM

## 2021-10-20 DIAGNOSIS — S51801A Unspecified open wound of right forearm, initial encounter: Secondary | ICD-10-CM

## 2021-10-20 NOTE — Progress Notes (Signed)
Haeberle, JANALYNN EDER ([id number]) Visit Report for 10/20/2021 Arrival Information Details Patient Name: Hannah [name], Hannah [name]. Date of Service: 10/20/2021 8:30 AM Medical Record Number: [id number] Patient Account Number: [id number] Date of Birth/Sex: 02-16-1941 (81 y.o. F) Treating RN: Levora Dredge Primary Care Ernest Popowski: Emily Filbert Other Clinician: Referring Jamarria Real: Emily Filbert Treating Weber Monnier/Extender: Yaakov Guthrie in Treatment: 1 Visit Information History Since Last Visit Added or deleted any medications: No Patient Arrived: Ambulatory Any new allergies or adverse reactions: No Arrival Time: 08:45 Had a fall or experienced change in No Accompanied By: self activities of daily living that may affect Transfer Assistance: None risk of falls: Patient Identification Verified: Yes Hospitalized since last visit: No Secondary Verification Process Completed: Yes Has Dressing in Place as Prescribed: Yes Patient Has Alerts: Yes Pain Present Now: No Patient Alerts: Patient on Blood Thinner Electronic Signature(s) Signed: 10/20/2021 3:48:03 PM By: Levora Dredge Entered By: Levora Dredge on 10/20/2021 08:45:21 Mcintire, Marvelous J. ([id number]) -------------------------------------------------------------------------------- Clinic Level of Care Assessment Details Patient Name: Hannah [name], [name] Date of Service: 10/20/2021 8:30 AM Medical Record Number: [id number] Patient Account Number: [id number] Date of Birth/Sex: 1940-07-09 (81 y.o. F) Treating RN: Levora Dredge Primary Care Allona Gondek: Emily Filbert Other Clinician: Referring Berdella Bacot: Emily Filbert Treating Lanijah Warzecha/Extender: Yaakov Guthrie in Treatment: 1 Clinic Level of Care Assessment Items TOOL 4 Quantity Score '[]'$  - Use when only an EandM is performed on FOLLOW-UP visit 0 ASSESSMENTS - Nursing Assessment / Reassessment X - Reassessment of Co-morbidities (includes updates in patient status) 1 10 X- 1  5 Reassessment of Adherence to Treatment Plan ASSESSMENTS - Wound and Skin Assessment / Reassessment X - Simple Wound Assessment / Reassessment - one wound 1 5 '[]'$  - 0 Complex Wound Assessment / Reassessment - multiple wounds '[]'$  - 0 Dermatologic / Skin Assessment (not related to wound area) ASSESSMENTS - Focused Assessment '[]'$  - Circumferential Edema Measurements - multi extremities 0 '[]'$  - 0 Nutritional Assessment / Counseling / Intervention '[]'$  - 0 Lower Extremity Assessment (monofilament, tuning fork, pulses) '[]'$  - 0 Peripheral Arterial Disease Assessment (using hand held doppler) ASSESSMENTS - Ostomy and/or Continence Assessment and Care '[]'$  - Incontinence Assessment and Management 0 '[]'$  - 0 Ostomy Care Assessment and Management (repouching, etc.) PROCESS - Coordination of Care X - Simple Patient / Family Education for ongoing care 1 15 '[]'$  - 0 Complex (extensive) Patient / Family Education for ongoing care '[]'$  - 0 Staff obtains Programmer, systems, Records, Test Results / Process Orders '[]'$  - 0 Staff telephones HHA, Nursing Homes / Clarify orders / etc '[]'$  - 0 Routine Transfer to another Facility (non-emergent condition) '[]'$  - 0 Routine Hospital Admission (non-emergent condition) '[]'$  - 0 New Admissions / Biomedical engineer / Ordering NPWT, Apligraf, etc. '[]'$  - 0 Emergency Hospital Admission (emergent condition) X- 1 10 Simple Discharge Coordination '[]'$  - 0 Complex (extensive) Discharge Coordination PROCESS - Special Needs '[]'$  - Pediatric / Minor Patient Management 0 '[]'$  - 0 Isolation Patient Management '[]'$  - 0 Hearing / Language / Visual special needs '[]'$  - 0 Assessment of Community assistance (transportation, D/C planning, etc.) '[]'$  - 0 Additional assistance / Altered mentation '[]'$  - 0 Support Surface(s) Assessment (bed, cushion, seat, etc.) INTERVENTIONS - Wound Cleansing / Measurement Hannah [name], [name] ([id number]) X- 1 5 Simple Wound Cleansing - one wound '[]'$  - 0 Complex  Wound Cleansing - multiple wounds X- 1 5 Wound Imaging (photographs - any number of wounds) '[]'$  - 0 Wound Tracing (instead of photographs) X- 1 5 Simple Wound Measurement -  one wound '[]'$  - 0 Complex Wound Measurement - multiple wounds INTERVENTIONS - Wound Dressings X - Small Wound Dressing one or multiple wounds 1 10 '[]'$  - 0 Medium Wound Dressing one or multiple wounds '[]'$  - 0 Large Wound Dressing one or multiple wounds '[]'$  - 0 Application of Medications - topical '[]'$  - 0 Application of Medications - injection INTERVENTIONS - Miscellaneous '[]'$  - External ear exam 0 '[]'$  - 0 Specimen Collection (cultures, biopsies, blood, body fluids, etc.) '[]'$  - 0 Specimen(s) / Culture(s) sent or taken to Lab for analysis '[]'$  - 0 Patient Transfer (multiple staff / Civil Service fast streamer / Similar devices) '[]'$  - 0 Simple Staple / Suture removal (25 or less) '[]'$  - 0 Complex Staple / Suture removal (26 or more) '[]'$  - 0 Hypo / Hyperglycemic Management (close monitor of Blood Glucose) '[]'$  - 0 Ankle / Brachial Index (ABI) - do not check if billed separately X- 1 5 Vital Signs Has the patient been seen at the hospital within the last three years: Yes Total Score: 75 Level Of Care: New/Established - Level 2 Electronic Signature(s) Signed: 10/20/2021 3:48:03 PM By: Levora Dredge Entered By: Levora Dredge on 10/20/2021 09:06:24 Hannah [name], [name] ([id number]) -------------------------------------------------------------------------------- Encounter Discharge Information Details Patient Name: Hannah [name], [name] Date of Service: 10/20/2021 8:30 AM Medical Record Number: [id number] Patient Account Number: [id number] Date of Birth/Sex: 1940-07-07 (81 y.o. F) Treating RN: Levora Dredge Primary Care Lealon Vanputten: Emily Filbert Other Clinician: Referring Eisley Barber: Emily Filbert Treating Chyanne Kohut/Extender: Yaakov Guthrie in Treatment: 1 Encounter Discharge Information Items Discharge Condition: Stable Ambulatory  Status: Ambulatory Discharge Destination: Home Transportation: Private Auto Accompanied By: self Schedule Follow-up Appointment: Yes Clinical Summary of Care: Electronic Signature(s) Signed: 10/20/2021 3:48:03 PM By: Levora Dredge Entered By: Levora Dredge on 10/20/2021 09:07:03 Hannah [name], Hannah [name] ([id number]) -------------------------------------------------------------------------------- Lower Extremity Assessment Details Patient Name: Hannah [name], [name] Date of Service: 10/20/2021 8:30 AM Medical Record Number: [id number] Patient Account Number: [id number] Date of Birth/Sex: June 30, 1940 (81 y.o. F) Treating RN: Levora Dredge Primary Care Kathrina Crosley: Emily Filbert Other Clinician: Referring Sandar Krinke: Emily Filbert Treating Ikenna Ohms/Extender: Yaakov Guthrie in Treatment: 1 Electronic Signature(s) Signed: 10/20/2021 3:48:03 PM By: Levora Dredge Entered By: Levora Dredge on 10/20/2021 08:54:17 Curless, Camara J. ([id number]) -------------------------------------------------------------------------------- Multi Wound Chart Details Patient Name: Hannah [name], Hannah [name] Date of Service: 10/20/2021 8:30 AM Medical Record Number: [id number] Patient Account Number: [id number] Date of Birth/Sex: 04-Aug-1940 (81 y.o. F) Treating RN: Levora Dredge Primary Care Blair Mesina: Emily Filbert Other Clinician: Referring Ronnae Kaser: Emily Filbert Treating Juliet Vasbinder/Extender: Yaakov Guthrie in Treatment: 1 Vital Signs Height(in): 66 Pulse(bpm): 58 Weight(lbs): 136 Blood Pressure(mmHg): 122/63 Body Mass Index(BMI): 21.9 Temperature(F): 97.5 Respiratory Rate(breaths/min): 18 Photos: [N/A:N/A] Wound Location: Right Forearm N/A N/A Wounding Event: Trauma N/A N/A Primary Etiology: Trauma, Other N/A N/A Comorbid History: Asthma, Arrhythmia, Hypertension N/A N/A Date Acquired: 10/06/2021 N/A N/A Weeks of Treatment: 1 N/A N/A Wound Status: Open N/A N/A Wound Recurrence: No N/A  N/A Measurements L x W x D (cm) 0.2x1.7x0.1 N/A N/A Area (cm) : 0.267 N/A N/A Volume (cm) : 0.027 N/A N/A % Reduction in Area: 69.10% N/A N/A % Reduction in Volume: 68.60% N/A N/A Classification: Full Thickness Without Exposed N/A N/A Support Structures Exudate Amount: Medium N/A N/A Exudate Type: Serosanguineous N/A N/A Exudate Color: red, brown N/A N/A Granulation Amount: Large (67-100%) N/A N/A Granulation Quality: Red N/A N/A Necrotic Amount: Small (1-33%) N/A N/A Exposed Structures: Fat Layer (Subcutaneous Tissue): N/A N/A Yes Epithelialization: Small (1-33%) N/A N/A Treatment Notes Electronic Signature(s)  Signed: 10/20/2021 3:48:03 PM By: Levora Dredge Entered By: Levora Dredge on 10/20/2021 08:54:34 Lincoln, Hannah [name] ([id number]) -------------------------------------------------------------------------------- Multi-Disciplinary Care Plan Details Patient Name: Hannah [name], Hannah [name] Date of Service: 10/20/2021 8:30 AM Medical Record Number: [id number] Patient Account Number: [id number] Date of Birth/Sex: 09/26/1940 (81 y.o. F) Treating RN: Levora Dredge Primary Care Quint Chestnut: Emily Filbert Other Clinician: Referring Jazyah Butsch: Emily Filbert Treating Johncharles Fusselman/Extender: Yaakov Guthrie in Treatment: 1 Active Inactive Wound/Skin Impairment Nursing Diagnoses: Impaired tissue integrity Knowledge deficit related to ulceration/compromised skin integrity Goals: Ulcer/skin breakdown will have a volume reduction of 30% by week 4 Date Initiated: 10/13/2021 Target Resolution Date: 11/10/2021 Goal Status: Active Ulcer/skin breakdown will have a volume reduction of 50% by week 8 Date Initiated: 10/13/2021 Target Resolution Date: 12/08/2021 Goal Status: Active Ulcer/skin breakdown will have a volume reduction of 80% by week 12 Date Initiated: 10/13/2021 Target Resolution Date: 01/05/2022 Goal Status: Active Ulcer/skin breakdown will heal within 14 weeks Date Initiated:  10/13/2021 Target Resolution Date: 01/19/2022 Goal Status: Active Interventions: Assess patient/caregiver ability to obtain necessary supplies Assess patient/caregiver ability to perform ulcer/skin care regimen upon admission and as needed Assess ulceration(s) every visit Provide education on ulcer and skin care Notes: Electronic Signature(s) Signed: 10/20/2021 3:48:03 PM By: Levora Dredge Entered By: Levora Dredge on 10/20/2021 08:54:27 Gotschall, Poppi J. ([id number]) -------------------------------------------------------------------------------- Pain Assessment Details Patient Name: Hannah [name], Hannah [name] Date of Service: 10/20/2021 8:30 AM Medical Record Number: [id number] Patient Account Number: [id number] Date of Birth/Sex: 09-May-1940 (81 y.o. F) Treating RN: Levora Dredge Primary Care Niaomi Cartaya: Emily Filbert Other Clinician: Referring Alsha Meland: Emily Filbert Treating Zalika Tieszen/Extender: Yaakov Guthrie in Treatment: 1 Active Problems Location of Pain Severity and Description of Pain Patient Has Paino No Site Locations Rate the pain. Current Pain Level: 0 Pain Management and Medication Current Pain Management: Electronic Signature(s) Signed: 10/20/2021 3:48:03 PM By: Levora Dredge Entered By: Levora Dredge on 10/20/2021 08:50:37 Eilers, Hannah [name] ([id number]) -------------------------------------------------------------------------------- Patient/Caregiver Education Details Patient Name: Hannah [name], [name] Date of Service: 10/20/2021 8:30 AM Medical Record Number: [id number] Patient Account Number: [id number] Date of Birth/Gender: 10-02-1940 (81 y.o. F) Treating RN: Levora Dredge Primary Care Physician: Emily Filbert Other Clinician: Referring Physician: Emily Filbert Treating Physician/Extender: Yaakov Guthrie in Treatment: 1 Education Assessment Education Provided To: Patient Education Topics Provided Wound/Skin Impairment: Handouts: Caring for  Your Ulcer Methods: Explain/Verbal Responses: State content correctly Electronic Signature(s) Signed: 10/20/2021 3:48:03 PM By: Levora Dredge Entered By: Levora Dredge on 10/20/2021 09:06:38 Hannah [name], Hannah [name] ([id number]) -------------------------------------------------------------------------------- Wound Assessment Details Patient Name: Hannah [name], Hannah [name] Date of Service: 10/20/2021 8:30 AM Medical Record Number: [id number] Patient Account Number: [id number] Date of Birth/Sex: 06/24/1940 (81 y.o. F) Treating RN: Levora Dredge Primary Care Jasey Cortez: Emily Filbert Other Clinician: Referring Raesean Bartoletti: Emily Filbert Treating Jezlyn Westerfield/Extender: Yaakov Guthrie in Treatment: 1 Wound Status Wound Number: 1 Primary Etiology: Trauma, Other Wound Location: Right Forearm Wound Status: Open Wounding Event: Trauma Comorbid History: Asthma, Arrhythmia, Hypertension Date Acquired: 10/06/2021 Weeks Of Treatment: 1 Clustered Wound: No Photos Wound Measurements Length: (cm) 0.2 Width: (cm) 1.7 Depth: (cm) 0.1 Area: (cm) 0.267 Volume: (cm) 0.027 % Reduction in Area: 69.1% % Reduction in Volume: 68.6% Epithelialization: Small (1-33%) Tunneling: No Undermining: No Wound Description Classification: Full Thickness Without Exposed Support Structu Exudate Amount: Medium Exudate Type: Serosanguineous Exudate Color: red, brown res Foul Odor After Cleansing: No Slough/Fibrino Yes Wound Bed Granulation Amount: Large (67-100%) Exposed Structure Granulation Quality: Red Fat Layer (Subcutaneous Tissue) Exposed: Yes Necrotic Amount: Small (1-33%) Necrotic Quality: Adherent Slough Treatment  Notes Wound #1 (Forearm) Wound Laterality: Right Cleanser Soap and Water Discharge Instruction: Gently cleanse wound with antibacterial soap, rinse and pat dry prior to dressing wounds Peri-Wound Care Topical Bacitracin Ointment, 1 (oz) tube Primary Dressing Karbowski, Latrease J.  ([id number]) Non-Adherent Pad 3x8 (in/in) Discharge Instruction: Add to wound bed to alleviate sticking. Secondary Dressing [place] Roll-Medium Discharge Instruction: Apply Conforming Stretch Guaze Bandage as directed Secured With [employer] Dressing, Latex-free, Size 5, Small-Head / Shoulder / Thigh Compression Wrap Compression Stockings Add-Ons Electronic Signature(s) Signed: 10/20/2021 3:48:03 PM By: Levora Dredge Entered By: Levora Dredge on 10/20/2021 08:54:08 Meskill, Minna J. ([id number]) -------------------------------------------------------------------------------- Vitals Details Patient Name: Hannah [name], [name] Date of Service: 10/20/2021 8:30 AM Medical Record Number: [id number] Patient Account Number: [id number] Date of Birth/Sex: 1941-01-10 (81 y.o. F) Treating RN: Levora Dredge Primary Care Domenik Trice: Emily Filbert Other Clinician: Referring Sunita Demond: Emily Filbert Treating Luan Maberry/Extender: Yaakov Guthrie in Treatment: 1 Vital Signs Time Taken: 08:46 Temperature (F): 97.5 Height (in): 66 Pulse (bpm): 59 Weight (lbs): 136 Respiratory Rate (breaths/min): 18 Body Mass Index (BMI): 21.9 Blood Pressure (mmHg): 122/63 Reference Range: 80 - 120 mg / dl Electronic Signature(s) Signed: 10/20/2021 3:48:03 PM By: Levora Dredge Entered By: Levora Dredge on 10/20/2021 08:50:30

## 2021-10-20 NOTE — Progress Notes (Signed)
Hannah [name], Hannah [name] ([id number]) Visit Report for 10/20/2021 Chief Complaint Document Details Patient Name: Hannah [name], Hannah [name] Date of Service: 10/20/2021 8:30 AM Medical Record Number: [id number] Patient Account Number: [id number] Date of Birth/Sex: 1940-06-25 (81 y.o. F) Treating RN: Levora Dredge Primary Care Provider: Emily Filbert Other Clinician: Referring Provider: Emily Filbert Treating Provider/Extender: Yaakov Guthrie in Treatment: 1 Information Obtained from: Patient Chief Complaint 10/13/2021; right forearm wound Electronic Signature(s) Signed: 10/20/2021 9:13:32 AM By: Kalman Shan DO Entered By: Kalman Shan on 10/20/2021 09:10:04 Hannah [name], Hannah [name]. ([id number]) -------------------------------------------------------------------------------- HPI Details Patient Name: Hannah [name], Hannah [name]. Date of Service: 10/20/2021 8:30 AM Medical Record Number: [id number] Patient Account Number: [id number] Date of Birth/Sex: 1940-11-10 (81 y.o. F) Treating RN: Levora Dredge Primary Care Provider: Emily Filbert Other Clinician: Referring Provider: Emily Filbert Treating Provider/Extender: Yaakov Guthrie in Treatment: 1 History of Present Illness HPI Description: Admission 10/13/2021 Ms. Azaliah Carrero is an 81 year old female with a Past medical history of essential hypertension that presents to the clinic for a 1 week history of open wound to her right forearm. She states she hit her arm against a dog crate Creating a laceration. The following day she went to urgent care for evaluation. They put her on Keflex for 7 days for which she states she completed. She has been using sterile gauze to the wound bed daily. She currently denies signs of infection. 7/12; patient presents for follow-up. She has been using bacitracin ointment to the wound bed daily. She has no issues or complaints today. Electronic Signature(s) Signed: 10/20/2021 9:13:32 AM By: Kalman Shan  DO Entered By: Kalman Shan on 10/20/2021 09:10:25 Hannah [name], Hannah [name]. ([id number]) -------------------------------------------------------------------------------- Physical Exam Details Patient Name: Schowalter, Irwin Date of Service: 10/20/2021 8:30 AM Medical Record Number: [id number] Patient Account Number: [id number] Date of Birth/Sex: [birth year] (81 y.o. F) Treating RN: Levora Dredge Primary Care Provider: Emily Filbert Other Clinician: Referring Provider: Emily Filbert Treating Provider/Extender: Yaakov Guthrie in Treatment: 1 Constitutional . Psychiatric . Notes Right forearm: Open wound with granulation tissue. No signs of surrounding infection. Electronic Signature(s) Signed: 10/20/2021 9:13:32 AM By: Kalman Shan DO Entered By: Kalman Shan on 10/20/2021 09:10:50 Hannah [name], Hannah [name] ([id number]) -------------------------------------------------------------------------------- Physician Orders Details Patient Name: Hannah [name], Hannah [name]. Date of Service: 10/20/2021 8:30 AM Medical Record Number: [id number] Patient Account Number: [id number] Date of Birth/Sex: [birth year] (81 y.o. F) Treating RN: Levora Dredge Primary Care Provider: Emily Filbert Other Clinician: Referring Provider: Emily Filbert Treating Provider/Extender: Yaakov Guthrie in Treatment: 1 Verbal / Phone Orders: No Diagnosis Coding Follow-up Appointments o Return Appointment in 2 weeks. o Nurse Visit as needed [employer] o Wash wounds with antibacterial soap and water. o May shower; gently cleanse wound with antibacterial soap, rinse and pat dry prior to dressing wounds o No tub bath. Wound Treatment Wound #1 - Forearm Wound Laterality: Right Cleanser: Soap and Water 1 x Per VFI/43 Days Discharge Instructions: Gently cleanse wound with antibacterial soap, rinse and pat dry prior to dressing wounds Topical: Bacitracin Ointment, 1 (oz) tube 1 x Per Day/15  Days Primary Dressing: Non-Adherent Pad 3x8 (in/in) 1 x Per Day/15 Days Discharge Instructions: Add to wound bed to alleviate sticking. Secondary Dressing: Conforming Guaze Roll-Medium 1 x Per Day/15 Days Discharge Instructions: Apply Conforming Stretch Guaze Bandage as directed Secured With: Stretch Net Dressing, Latex-free, Size 5, Small-Head / Shoulder / Thigh 1 x Per PIR/51 Days Electronic Signature(s) Signed: 10/20/2021 9:13:32 AM By: Kalman Shan DO Entered By: Kalman Shan on 10/20/2021 09:11:44 Hannah [name],  Hannah JMarland Kitchen ([id number]) -------------------------------------------------------------------------------- Problem List Details Patient Name: Slee, Aleria [name]. Date of Service: 10/20/2021 8:30 AM Medical Record Number: [id number] Patient Account Number: [id number] Date of Birth/Sex: 22-Aug-1940 (81 y.o. F) Treating RN: Levora Dredge Primary Care Provider: Emily Filbert Other Clinician: Referring Provider: Emily Filbert Treating Provider/Extender: Yaakov Guthrie in Treatment: 1 Active Problems ICD-10 Encounter Code Description Active Date MDM Diagnosis S51.801A Unspecified open wound of right forearm, initial encounter 10/13/2021 No Yes S41.111A Laceration without foreign body of right upper arm, initial encounter 10/13/2021 No Yes Inactive Problems Resolved Problems Electronic Signature(s) Signed: 10/20/2021 9:13:32 AM By: Kalman Shan DO Entered By: Kalman Shan on 10/20/2021 09:09:59 Hannah [name], Hannah [name]. ([id number]) -------------------------------------------------------------------------------- Progress Note Details Patient Name: Hannah [name], Hannah [name]. Date of Service: 10/20/2021 8:30 AM Medical Record Number: [id number] Patient Account Number: [id number] Date of Birth/Sex: 11-Dec-1940 (81 y.o. F) Treating RN: Levora Dredge Primary Care Provider: Emily Filbert Other Clinician: Referring Provider: Emily Filbert Treating Provider/Extender: Yaakov Guthrie in Treatment: 1 Subjective Chief Complaint Information obtained from Patient 10/13/2021; right forearm wound History of Present Illness (HPI) Admission 10/13/2021 [name] is an 81 year old female with a Past medical history of essential hypertension that presents to the clinic for a 1 week history of open wound to her right forearm. She states she hit her arm against a dog crate Creating a laceration. The following day she went to urgent care for evaluation. They put her on Keflex for 7 days for which she states she completed. She has been using sterile gauze to the wound bed daily. She currently denies signs of infection. 7/12; patient presents for follow-up. She has been using bacitracin ointment to the wound bed daily. She has no issues or complaints today. Objective Constitutional Vitals Time Taken: 8:46 AM, Height: 66 in, Weight: 136 lbs, BMI: 21.9, Temperature: 97.5 F, Pulse: 59 bpm, Respiratory Rate: 18 breaths/min, Blood Pressure: 122/63 mmHg. General Notes: Right forearm: Open wound with granulation tissue. No signs of surrounding infection. Integumentary (Hair, Skin) Wound #1 status is Open. Original cause of wound was Trauma. The date acquired was: 10/06/2021. The wound has been in treatment 1 weeks. The wound is located on the Right Forearm. The wound measures 0.2cm length x 1.7cm width x 0.1cm depth; 0.267cm^2 area and 0.027cm^3 volume. There is Fat Layer (Subcutaneous Tissue) exposed. There is no tunneling or undermining noted. There is a medium amount of serosanguineous drainage noted. There is large (67-100%) red granulation within the wound bed. There is a small (1-33%) amount of necrotic tissue within the wound bed including Adherent Slough. Assessment Active Problems ICD-10 Unspecified open wound of right forearm, initial encounter Laceration without foreign body of right upper arm, initial encounter Patient's wound has shown improvement in size  apparent since last clinic visit. I recommended continuing bacitracin ointment daily. I recommended follow-up in 2 weeks and I am hopeful this will be healed by then. Plan Hannah [name], Hannah [name] ([id number]) Follow-up Appointments: Return Appointment in 2 weeks. Nurse Visit as needed Bathing/ Shower/ Hygiene: Wash wounds with antibacterial soap and water. May shower; gently cleanse wound with antibacterial soap, rinse and pat dry prior to dressing wounds No tub bath. WOUND #1: - Forearm Wound Laterality: Right Cleanser: Soap and Water 1 x Per CZY/60 Days Discharge Instructions: Gently cleanse wound with antibacterial soap, rinse and pat dry prior to dressing wounds Topical: Bacitracin Ointment, 1 (oz) tube 1 x Per Day/15 Days Primary Dressing: Non-Adherent Pad 3x8 (in/in) 1 x Per Day/15 Days Discharge Instructions: Add  to wound bed to alleviate sticking. Secondary Dressing: Conforming Guaze Roll-Medium 1 x Per Day/15 Days Discharge Instructions: Apply Conforming Stretch Guaze Bandage as directed Secured With: Stretch Net Dressing, Latex-free, Size 5, Small-Head / Shoulder / Thigh 1 x Per Day/15 Days 1. Bacitracin ointment daily 2. Follow-up in 2 weeks Electronic Signature(s) Signed: 10/20/2021 9:13:32 AM By: Kalman Shan DO Entered By: Kalman Shan on 10/20/2021 09:11:24 Hannah [name], Hannah [name]. ([id number]) -------------------------------------------------------------------------------- ROS/PFSH Details Patient Name: Cybulski, Janay [name]. Date of Service: 10/20/2021 8:30 AM Medical Record Number: [id number] Patient Account Number: [id number] Date of Birth/Sex: 03/28/1941 (82 y.o. 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 ([id number]) -------------------------------------------------------------------------------- SuperBill Details Patient Name: Eggert, Icela [name]. Date of Service: 10/20/2021 Medical Record Number: [id number] Patient Account Number: [id number] Date of Birth/Sex: 1941/03/31 (81 y.o. F) Treating RN: Levora Dredge Primary Care Provider: Emily Filbert Other Clinician: Referring Provider: Emily Filbert Treating Provider/Extender: Yaakov Guthrie in Treatment: 1 Diagnosis Coding ICD-10 Codes Code Description [phone] Unspecified open wound of right forearm, initial encounter S41.111A Laceration without foreign body of right upper arm, initial encounter Facility Procedures CPT4 Code: [id number] Description: [phone] - WOUND CARE VISIT-LEV 2 EST PT Modifier: Quantity: 1 Physician Procedures CPT4 Code: [id number] Description: 76546 - WC PHYS LEVEL 3 - EST PT Modifier: Quantity: 1 CPT4 Code: Description: ICD-10 Diagnosis Description S51.801A Unspecified open wound of right forearm, initial encounter S41.111A Laceration without foreign body of right upper arm, initial encounter Modifier: Quantity: Electronic Signature(s) Signed: 10/20/2021 9:13:32 AM By: Kalman Shan DO Entered By: Kalman Shan on 10/20/2021 09:11:36

## 2021-10-27 ENCOUNTER — Encounter: Payer: PPO | Admitting: Internal Medicine

## 2021-11-03 ENCOUNTER — Encounter: Payer: PPO | Admitting: Internal Medicine

## 2021-11-05 ENCOUNTER — Ambulatory Visit: Payer: PPO | Admitting: Obstetrics and Gynecology

## 2021-11-05 ENCOUNTER — Encounter: Payer: Self-pay | Admitting: Obstetrics and Gynecology

## 2021-11-05 VITALS — BP 120/64 | Ht 66.5 in | Wt 121.0 lb

## 2021-11-05 DIAGNOSIS — R399 Unspecified symptoms and signs involving the genitourinary system: Secondary | ICD-10-CM

## 2021-11-05 LAB — POCT URINALYSIS DIPSTICK
Bilirubin, UA: NEGATIVE
Blood, UA: NEGATIVE
Glucose, UA: NEGATIVE
Ketones, UA: NEGATIVE
Leukocytes, UA: NEGATIVE
Nitrite, UA: NEGATIVE
Protein, UA: NEGATIVE
Spec Grav, UA: 1.025 (ref 1.010–1.025)
pH, UA: 6 (ref 5.0–8.0)

## 2021-11-05 NOTE — Progress Notes (Signed)
Hannah Aus, MD   Chief Complaint  Patient presents with   Urinary Tract Infection    Pain urinating, frequency, hard to hold it in    HPI:      [name] is a 81 y.o. [phone] whose LMP was No LMP recorded. Patient has had a hysterectomy., presents today for UTI sx of urgency, frequency, dysuria for the past wk. No hematuria, fevers, pelvic pain, vag sx. No urine odor. Hx of IC and UTIs, but hard to differentiate sx. Saw pt 4/23 and referred to [name] urology (appt canceled by them but supposed to be rescheduled). Pt cutting back on caffeine, doesn't drink enough water daily.    Patient Active Problem List   Diagnosis Date Noted   Migraine 06/15/2017   Pelvic pain in female 06/15/2017   Personal history of ovarian cyst 06/15/2017   Reactive airway disease 12/28/2016   Low serum vitamin D 07/04/2016   Medicare annual wellness visit, initial 07/04/2016   'light-for-dates' infant with signs of fetal malnutrition 12/30/2015   Hyperlipidemia, mixed 12/22/2015   Perforated bowel ([place]) 10/06/2014   Abdominal pain, acute, left lower quadrant 08/05/2014   Hematochezia 08/05/2014   Atrial tachycardia ([place]) 03/27/2014   Benign essential hypertension 08/30/2013   Ovarian cyst, bilateral 12/17/2012   Right lower quadrant pain 12/17/2012   Chronic cystitis 05/15/2012   Incomplete emptying of bladder 05/15/2012   Microscopic hematuria 05/15/2012   Mixed urge and stress incontinence 05/15/2012   Symptoms involving urinary system 05/15/2012    Past Surgical History:  Procedure Laterality Date   ABDOMINAL HYSTERECTOMY     BACK SURGERY  35 years   BOWEL RESECTION  09/09/2014   Procedure: , bladder instilation;  Surgeon: Hannah Crawford III, MD;  Location: [facility] ORS;  Service: General;;   CARDIAC CATHETERIZATION Right 09/09/2014   Procedure: CENTRAL LINE INSERTION;  Surgeon: Hannah Crawford III, MD;  Location: [facility] ORS;  Service: General;  Laterality: Right;   CATARACT EXTRACTION W/  INTRAOCULAR LENS IMPLANT Right    CATARACT EXTRACTION W/PHACO Left 05/11/2016   Procedure: CATARACT EXTRACTION PHACO AND INTRAOCULAR LENS PLACEMENT ([place]);  Surgeon: Hannah Cotta, MD;  Location: [facility] ORS;  Service: Ophthalmology;  Laterality: Left;  [place] 1:25 AP% 25.4 CDE 36.38 Fluid pack lot # [id number]   JOINT REPLACEMENT Right    LAPAROTOMY N/A 09/09/2014   Procedure: EXPLORATORY LAPAROTOMY, small bowel resection;  Surgeon: Hannah Crawford III, MD;  Location: [facility] ORS;  Service: General;  Laterality: N/A;   NASAL SINUS SURGERY      Family History  Problem Relation Age of Onset   Heart disease Mother    Hypertension Mother    Breast cancer Paternal Aunt 95       2 aunts   Breast cancer Paternal Grandmother 22    Social History   Socioeconomic History   Marital status: Married    Spouse name: Not on file   Number of children: Not on file   Years of education: Not on file   Highest education level: Not on file  Occupational History   Not on file  Tobacco Use   Smoking status: Never   Smokeless tobacco: Never  Vaping Use   Vaping Use: Never used  Substance and Sexual Activity   Alcohol use: No   Drug use: No   Sexual activity: Not on file  Other Topics Concern   Not on file  Social History Narrative   Not on file   Social Determinants  of Health   Financial Resource Strain: Not on file  Food Insecurity: Not on file  Transportation Needs: Not on file  Physical Activity: Not on file  Stress: Not on file  Social Connections: Not on file  Intimate Partner Violence: Not on file    Outpatient Medications Prior to Visit  Medication Sig Dispense Refill   amLODipine (NORVASC) 5 MG tablet Take 5 mg by mouth daily.     aspirin 81 MG tablet Take 81 mg by mouth daily.     metoprolol succinate (TOPROL-XL) 50 MG 24 hr tablet Take 50 mg by mouth 2 (two) times daily. Take with or immediately following a meal.     flavoxATE (URISPAS) 100 MG tablet Take 1 tablet (100 mg total) by  mouth 4 (four) times daily as needed for bladder spasms (bladder spasm). 50 tablet 3   No facility-administered medications prior to visit.      ROS:  Review of Systems  Constitutional:  Negative for fever.  Gastrointestinal:  Negative for blood in stool, constipation, diarrhea, nausea and vomiting.  Genitourinary:  Positive for dysuria, frequency and urgency. Negative for dyspareunia, flank pain, hematuria, vaginal bleeding, vaginal discharge and vaginal pain.  Musculoskeletal:  Negative for back pain.  Skin:  Negative for rash.     OBJECTIVE:   Vitals:  BP 120/64   Ht 5' 6.5" (1.689 m)   Wt 121 lb (54.9 kg)   BMI 19.24 kg/m   Physical Exam Vitals reviewed.  Constitutional:      Appearance: She is well-developed. She is not ill-appearing or toxic-appearing.  Pulmonary:     Effort: Pulmonary effort is normal.  Abdominal:     Tenderness: There is no right CVA tenderness or left CVA tenderness.  Musculoskeletal:        General: Normal range of motion.     Cervical back: Normal range of motion.  Neurological:     General: No focal deficit present.     Mental Status: She is alert and oriented to person, place, and time.     Cranial Nerves: No cranial nerve deficit.  Psychiatric:        Behavior: Behavior normal.        Thought Content: Thought content normal.        Judgment: Judgment normal.     Results: Results for orders placed or performed in visit on 11/05/21 (from the past 24 hour(s))  POCT Urinalysis Dipstick     Status: Normal   Collection Time: 11/05/21 11:39 AM  Result Value Ref Range   Color, UA yellow    Clarity, UA clear    Glucose, UA Negative Negative   Bilirubin, UA neg    Ketones, UA neg    Spec Grav, UA 1.025 1.010 - 1.025   Blood, UA neg    pH, UA 6.0 5.0 - 8.0   Protein, UA Negative Negative   Urobilinogen, UA     Nitrite, UA neg    Leukocytes, UA Negative Negative   Appearance     Odor       Assessment/Plan: UTI symptoms - Plan:  Urine Culture, POCT Urinalysis Dipstick; neg UA. Check C&S. Pt to f/u with urology for IC. In meantime, d/c caffeine and increase water. Most likely dehydrated and could be cause of sx. F/u prn.     No follow-ups on file.  Hannah [name] B. Cathline Dowen, PA-C 11/05/2021 11:40 AM

## 2021-11-07 LAB — URINE CULTURE: Organism ID, Bacteria: NO GROWTH

## 2021-11-07 NOTE — Progress Notes (Signed)
Pls let pt know C&S neg. Increase water

## 2021-11-12 DIAGNOSIS — I10 Essential (primary) hypertension: Secondary | ICD-10-CM | POA: Diagnosis not present

## 2021-11-15 IMAGING — MG MM DIGITAL SCREENING BILAT W/ TOMO AND CAD
8 series · 9 of 24 positions shown · non-contrast
Comparison: Previous exam(s).

CLINICAL DATA: Screening.

EXAM:
DIGITAL SCREENING BILATERAL MAMMOGRAM WITH TOMOSYNTHESIS AND CAD
TECHNIQUE: Bilateral screening digital craniocaudal and mediolateral oblique
mammograms were obtained. Bilateral screening digital breast
tomosynthesis was performed. The images were evaluated with
computer-aided detection.

[R MLO synth-2D]
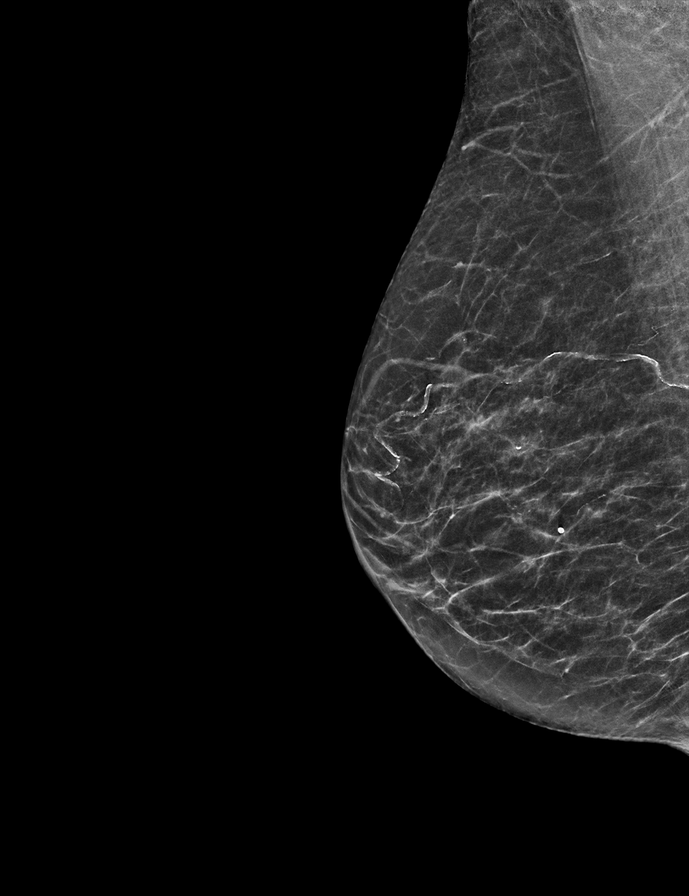

[R CC synth-2D]
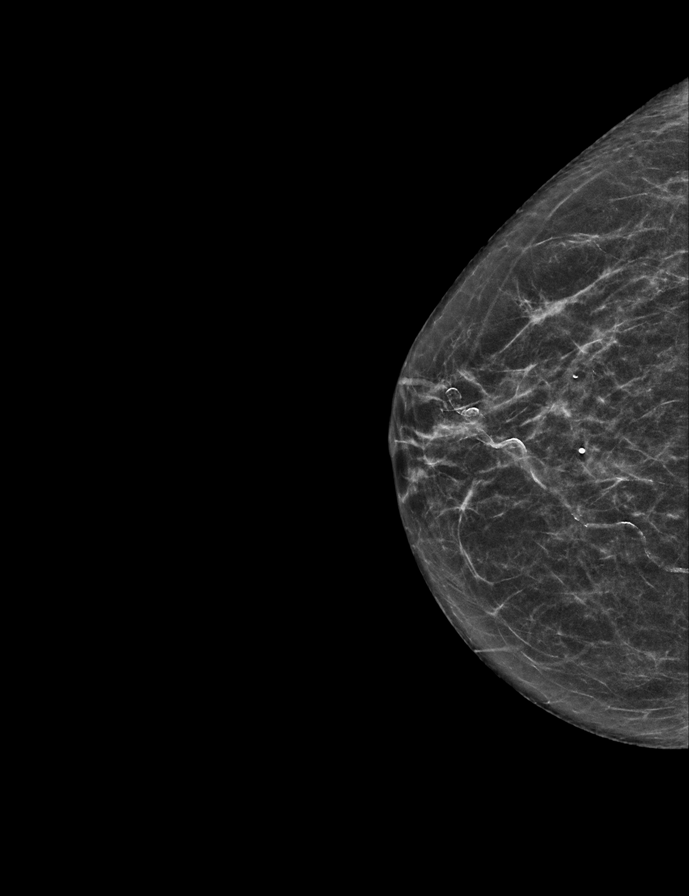

[L CC synth-2D]
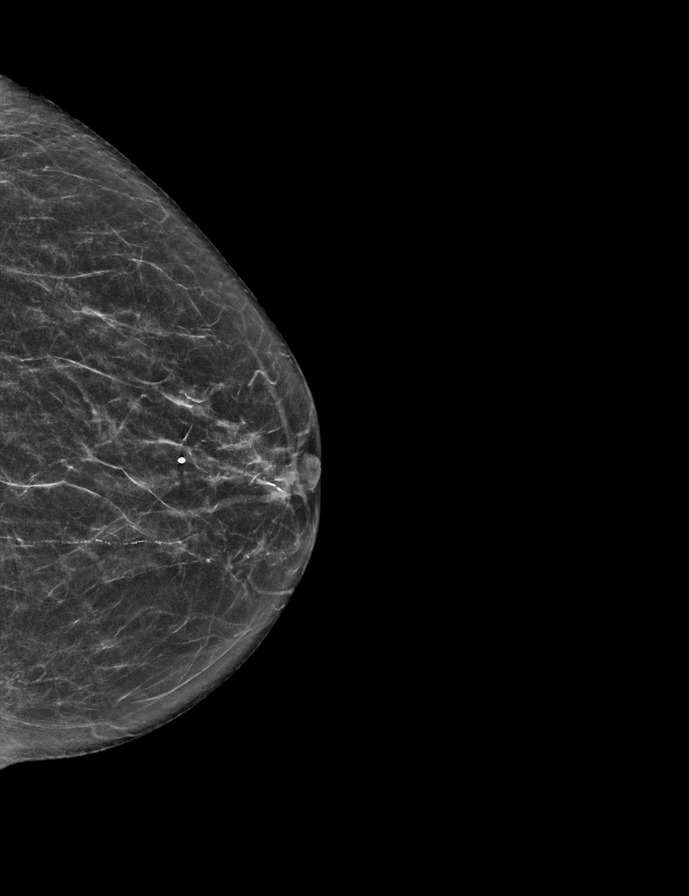

[L MLO synth-2D]
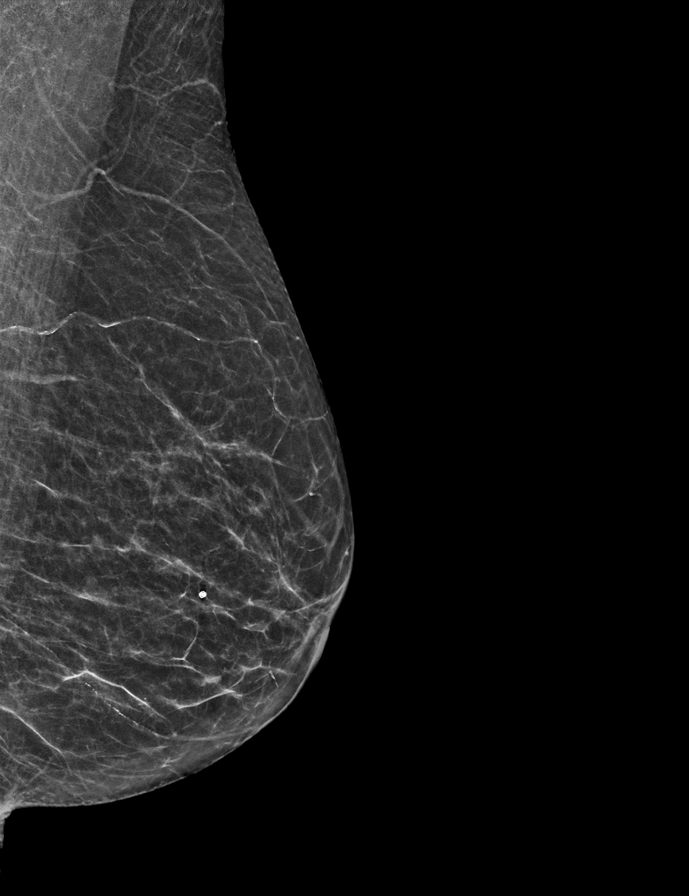

[L CC tomo · 2 of 51 frames shown]
[frame 17/51]
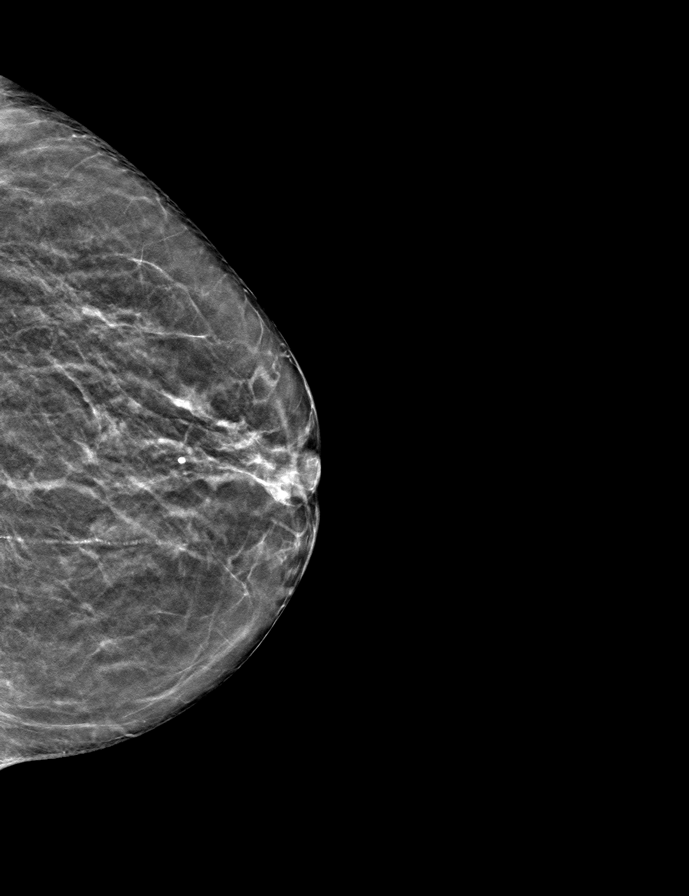
[frame 26/51]
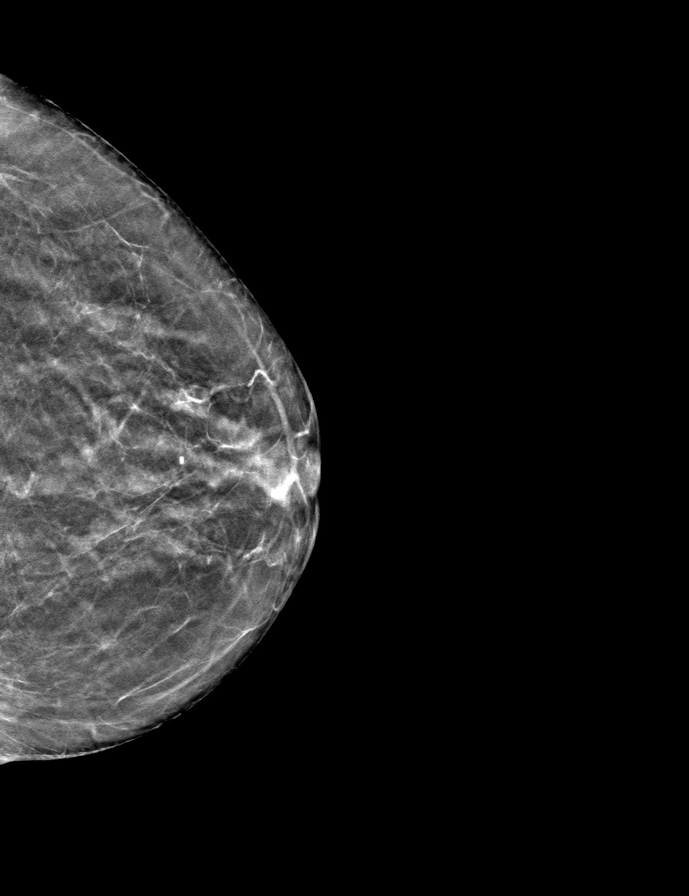

[L MLO tomo · tomo slice 23/46.0]
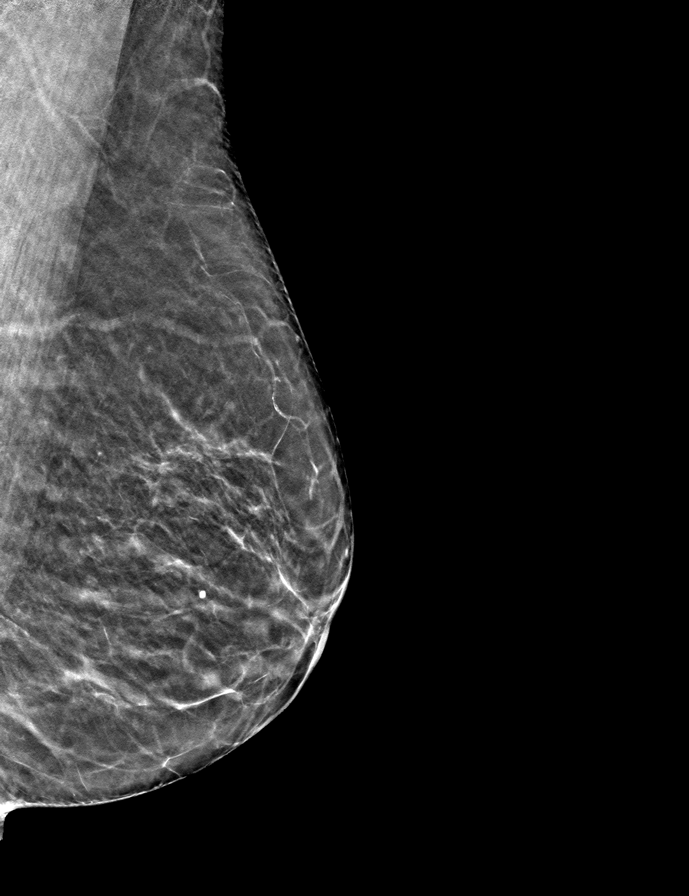

[R CC tomo · tomo slice 25/49.0]
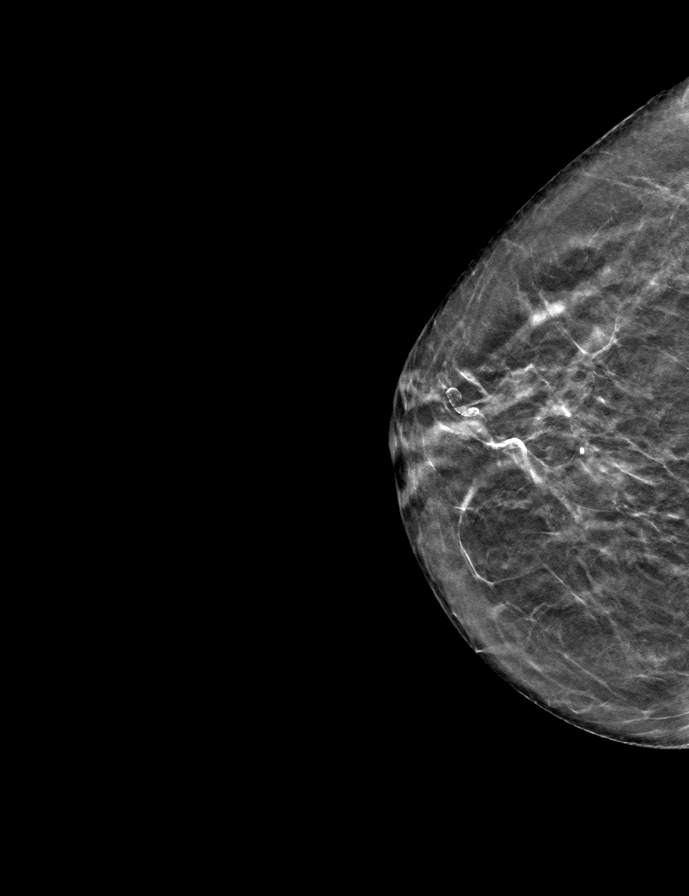

[R MLO tomo · tomo slice 25/49.0]
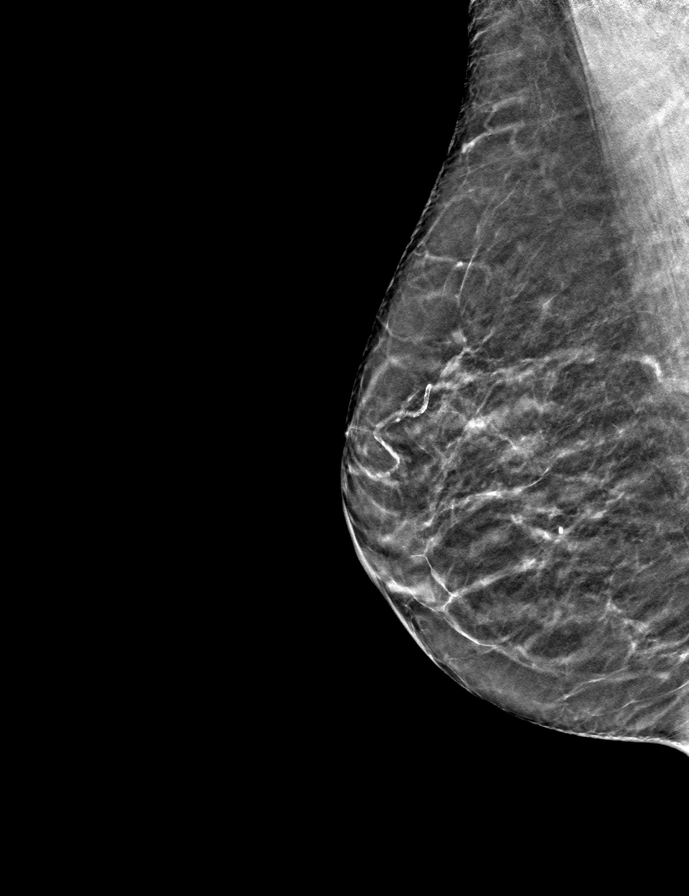

[9 of 24 positions shown; findings below may reference images not displayed]

ACR Breast Density Category b: There are scattered areas of
fibroglandular density.
FINDINGS: There are no findings suspicious for malignancy.
IMPRESSION: No mammographic evidence of malignancy. A result letter of this
screening mammogram will be mailed directly to the patient.

RECOMMENDATION:
Screening mammogram in one year. (Code:51-O-LD2)

BI-RADS CATEGORY  1: Negative.

## 2021-11-23 DIAGNOSIS — Z1231 Encounter for screening mammogram for malignant neoplasm of breast: Secondary | ICD-10-CM | POA: Diagnosis not present

## 2021-11-23 DIAGNOSIS — R110 Nausea: Secondary | ICD-10-CM | POA: Diagnosis not present

## 2021-11-23 DIAGNOSIS — J431 Panlobular emphysema: Secondary | ICD-10-CM | POA: Diagnosis not present

## 2021-11-23 DIAGNOSIS — I10 Essential (primary) hypertension: Secondary | ICD-10-CM | POA: Diagnosis not present

## 2021-11-23 DIAGNOSIS — R5381 Other malaise: Secondary | ICD-10-CM | POA: Diagnosis not present

## 2021-11-23 DIAGNOSIS — R5383 Other fatigue: Secondary | ICD-10-CM | POA: Diagnosis not present

## 2021-11-25 ENCOUNTER — Other Ambulatory Visit: Payer: Self-pay | Admitting: Physician Assistant

## 2021-11-25 DIAGNOSIS — Z1231 Encounter for screening mammogram for malignant neoplasm of breast: Secondary | ICD-10-CM

## 2021-12-07 ENCOUNTER — Inpatient Hospital Stay: Admission: RE | Admit: 2021-12-07 | Payer: PPO | Source: Ambulatory Visit

## 2021-12-27 ENCOUNTER — Ambulatory Visit: Payer: PPO

## 2022-01-04 DIAGNOSIS — M65341 Trigger finger, right ring finger: Secondary | ICD-10-CM | POA: Diagnosis not present

## 2022-01-04 DIAGNOSIS — M65331 Trigger finger, right middle finger: Secondary | ICD-10-CM | POA: Diagnosis not present

## 2022-01-19 ENCOUNTER — Ambulatory Visit
Admission: RE | Admit: 2022-01-19 | Discharge: 2022-01-19 | Disposition: A | Discharge status: Home / Self Care | Payer: PPO | Source: Ambulatory Visit | Attending: Physician Assistant | Admitting: Physician Assistant

## 2022-01-19 DIAGNOSIS — Z1231 Encounter for screening mammogram for malignant neoplasm of breast: Secondary | ICD-10-CM | POA: Diagnosis not present

## 2022-01-24 ENCOUNTER — Other Ambulatory Visit: Payer: Self-pay | Admitting: Physician Assistant

## 2022-01-26 ENCOUNTER — Other Ambulatory Visit: Payer: Self-pay | Admitting: Physician Assistant

## 2022-01-26 DIAGNOSIS — N6489 Other specified disorders of breast: Secondary | ICD-10-CM

## 2022-01-26 DIAGNOSIS — R300 Dysuria: Secondary | ICD-10-CM | POA: Diagnosis not present

## 2022-01-26 DIAGNOSIS — R928 Other abnormal and inconclusive findings on diagnostic imaging of breast: Secondary | ICD-10-CM

## 2022-02-16 DIAGNOSIS — J40 Bronchitis, not specified as acute or chronic: Secondary | ICD-10-CM | POA: Diagnosis not present

## 2022-02-16 DIAGNOSIS — J45902 Unspecified asthma with status asthmaticus: Secondary | ICD-10-CM | POA: Diagnosis not present

## 2022-02-16 DIAGNOSIS — B349 Viral infection, unspecified: Secondary | ICD-10-CM | POA: Diagnosis not present

## 2022-02-16 DIAGNOSIS — R059 Cough, unspecified: Secondary | ICD-10-CM | POA: Diagnosis not present

## 2022-02-16 DIAGNOSIS — J441 Chronic obstructive pulmonary disease with (acute) exacerbation: Secondary | ICD-10-CM | POA: Diagnosis not present

## 2022-02-18 ENCOUNTER — Other Ambulatory Visit: Payer: PPO

## 2022-02-21 ENCOUNTER — Ambulatory Visit
Admission: RE | Admit: 2022-02-21 | Discharge: 2022-02-21 | Disposition: A | Discharge status: Home / Self Care | Payer: PPO | Source: Ambulatory Visit | Attending: Physician Assistant | Admitting: Physician Assistant

## 2022-02-21 ENCOUNTER — Encounter: Payer: Self-pay | Admitting: Physician Assistant

## 2022-02-21 DIAGNOSIS — R928 Other abnormal and inconclusive findings on diagnostic imaging of breast: Secondary | ICD-10-CM | POA: Diagnosis not present

## 2022-02-21 DIAGNOSIS — R92321 Mammographic fibroglandular density, right breast: Secondary | ICD-10-CM | POA: Diagnosis not present

## 2022-02-21 DIAGNOSIS — N6489 Other specified disorders of breast: Secondary | ICD-10-CM | POA: Insufficient documentation

## 2022-02-21 DIAGNOSIS — N6311 Unspecified lump in the right breast, upper outer quadrant: Secondary | ICD-10-CM | POA: Diagnosis not present

## 2022-02-21 DIAGNOSIS — N6312 Unspecified lump in the right breast, upper inner quadrant: Secondary | ICD-10-CM | POA: Diagnosis not present

## 2022-02-23 ENCOUNTER — Other Ambulatory Visit: Payer: Self-pay | Admitting: Physician Assistant

## 2022-02-23 DIAGNOSIS — N63 Unspecified lump in unspecified breast: Secondary | ICD-10-CM

## 2022-02-23 DIAGNOSIS — R928 Other abnormal and inconclusive findings on diagnostic imaging of breast: Secondary | ICD-10-CM

## 2022-03-01 DIAGNOSIS — R5383 Other fatigue: Secondary | ICD-10-CM | POA: Diagnosis not present

## 2022-03-01 DIAGNOSIS — J208 Acute bronchitis due to other specified organisms: Secondary | ICD-10-CM | POA: Diagnosis not present

## 2022-03-01 DIAGNOSIS — U071 COVID-19: Secondary | ICD-10-CM | POA: Diagnosis not present

## 2022-03-10 ENCOUNTER — Other Ambulatory Visit: Payer: PPO

## 2022-03-14 DIAGNOSIS — Z79899 Other long term (current) drug therapy: Secondary | ICD-10-CM | POA: Diagnosis not present

## 2022-03-14 DIAGNOSIS — E538 Deficiency of other specified B group vitamins: Secondary | ICD-10-CM | POA: Diagnosis not present

## 2022-03-14 DIAGNOSIS — M818 Other osteoporosis without current pathological fracture: Secondary | ICD-10-CM | POA: Diagnosis not present

## 2022-03-16 DIAGNOSIS — J431 Panlobular emphysema: Secondary | ICD-10-CM | POA: Diagnosis not present

## 2022-03-16 DIAGNOSIS — E538 Deficiency of other specified B group vitamins: Secondary | ICD-10-CM | POA: Diagnosis not present

## 2022-03-16 DIAGNOSIS — J449 Chronic obstructive pulmonary disease, unspecified: Secondary | ICD-10-CM | POA: Diagnosis not present

## 2022-03-16 DIAGNOSIS — E782 Mixed hyperlipidemia: Secondary | ICD-10-CM | POA: Diagnosis not present

## 2022-03-16 DIAGNOSIS — Z0000 Encounter for general adult medical examination without abnormal findings: Secondary | ICD-10-CM | POA: Diagnosis not present

## 2022-03-17 ENCOUNTER — Ambulatory Visit
Admission: RE | Admit: 2022-03-17 | Discharge: 2022-03-17 | Disposition: A | Discharge status: Home / Self Care | Payer: PPO | Source: Ambulatory Visit | Attending: Physician Assistant | Admitting: Physician Assistant

## 2022-03-17 DIAGNOSIS — N63 Unspecified lump in unspecified breast: Secondary | ICD-10-CM

## 2022-03-17 DIAGNOSIS — C50911 Malignant neoplasm of unspecified site of right female breast: Secondary | ICD-10-CM | POA: Diagnosis not present

## 2022-03-17 DIAGNOSIS — R928 Other abnormal and inconclusive findings on diagnostic imaging of breast: Secondary | ICD-10-CM | POA: Insufficient documentation

## 2022-03-17 DIAGNOSIS — D0511 Intraductal carcinoma in situ of right breast: Secondary | ICD-10-CM | POA: Diagnosis not present

## 2022-03-17 HISTORY — PX: BREAST BIOPSY: SHX20

## 2022-03-17 MED ORDER — LIDOCAINE HCL (PF) 1 % IJ SOLN
5.0000 mL | Freq: Once | INTRAMUSCULAR | Status: AC
  Administered 2022-03-17: 5 mL via INTRADERMAL

## 2022-03-17 MED ORDER — LIDOCAINE-EPINEPHRINE 1 %-1:100000 IJ SOLN
10.0000 mL | Freq: Once | INTRAMUSCULAR | Status: AC
  Administered 2022-03-17: 10 mL via INTRADERMAL

## 2022-03-21 ENCOUNTER — Encounter: Payer: Self-pay | Admitting: *Deleted

## 2022-03-21 DIAGNOSIS — C50919 Malignant neoplasm of unspecified site of unspecified female breast: Secondary | ICD-10-CM

## 2022-03-21 LAB — SURGICAL PATHOLOGY

## 2022-03-21 NOTE — Progress Notes (Signed)
Received referral for newly diagnosed breast cancer from [facility].  Navigation initiated.  Hannah [name] and her children would like to see medical oncology prior to setting up a surgical consultation.   She will see Dr. Tasia Catchings tomorrow morning.  Appt. Details given to her and her family.

## 2022-03-22 ENCOUNTER — Inpatient Hospital Stay: Payer: PPO

## 2022-03-22 ENCOUNTER — Encounter: Payer: Self-pay | Admitting: *Deleted

## 2022-03-22 ENCOUNTER — Inpatient Hospital Stay: Payer: PPO | Attending: Oncology | Admitting: Oncology

## 2022-03-22 ENCOUNTER — Telehealth: Payer: Self-pay | Admitting: Oncology

## 2022-03-22 VITALS — BP 130/50 | HR 62 | Temp 97.8°F

## 2022-03-22 DIAGNOSIS — Z90710 Acquired absence of both cervix and uterus: Secondary | ICD-10-CM | POA: Diagnosis not present

## 2022-03-22 DIAGNOSIS — Z170 Estrogen receptor positive status [ER+]: Secondary | ICD-10-CM

## 2022-03-22 DIAGNOSIS — C50811 Malignant neoplasm of overlapping sites of right female breast: Secondary | ICD-10-CM

## 2022-03-22 DIAGNOSIS — C50911 Malignant neoplasm of unspecified site of right female breast: Secondary | ICD-10-CM

## 2022-03-22 DIAGNOSIS — Z7189 Other specified counseling: Secondary | ICD-10-CM

## 2022-03-22 DIAGNOSIS — Z803 Family history of malignant neoplasm of breast: Secondary | ICD-10-CM | POA: Diagnosis not present

## 2022-03-22 DIAGNOSIS — Z809 Family history of malignant neoplasm, unspecified: Secondary | ICD-10-CM

## 2022-03-22 DIAGNOSIS — C50919 Malignant neoplasm of unspecified site of unspecified female breast: Secondary | ICD-10-CM

## 2022-03-22 DIAGNOSIS — I10 Essential (primary) hypertension: Secondary | ICD-10-CM | POA: Diagnosis not present

## 2022-03-22 NOTE — Addendum Note (Signed)
Addended by: Daiva Huge on: 03/22/2022 12:17 PM   Modules accepted: Orders

## 2022-03-22 NOTE — Progress Notes (Signed)
Accompanied patient and family to initial medical oncology appointment.   Reviewed Breast Cancer treatment handbook.   Care plan summary given to patient.   Reviewed outreach programs and cancer center services.   

## 2022-03-22 NOTE — Progress Notes (Signed)
Called daughter [name] to let her know that Dr. Tasia Catchings and Dr. Peyton Najjar spoke and would like a breast MRI instead of US of the axilla and we will be working on getting that scheduled.

## 2022-03-22 NOTE — Telephone Encounter (Signed)
LVM for [name] [place] to have pt scheduled. as of 4:07pm have not recieved call yet or pt has not been scheduled

## 2022-03-22 NOTE — Progress Notes (Unsigned)
Hematology/Oncology Consult note [phone] [phone]         Patient Care Team: [name], MD as PCP - General (Internal Medicine)  REFERRING PROVIDER: [name], MD   CHIEF COMPLAINTS/REASON FOR VISIT:  Evaluation of right breast cancer   HISTORY OF PRESENTING ILLNESS:   Hannah [name] is a  81 y.o.  female with PMH listed below was seen in consultation at the request of  [name], MD  for evaluation of right breast cancer.   Oncology History  Breast cancer ([place])  01/24/2022 Imaging   Bilateral screening mammogram  In the right breast, a possible asymmetry warrants further evaluation. In the left breast, no findings suspicious for malignancy.    02/21/2022 Imaging   Unilateral breast mammogram and ultrasound  Showed irregular hypoechoic mass in the RIGHT breast at the 12 o'clock axis, 3 cm from the nipple, measuring 5 mm, a likely correlate for the mammographic finding. This is a suspicious finding for which ultrasound-guided biopsy is recommended.    03/17/2022 Cancer Staging   Staging form: Breast, AJCC 8th Edition - Clinical stage from 03/17/2022: Stage IA (cT1a, cN0, cM0, G2, ER+, PR-, HER2-) - Signed by Earlie Server, MD on 03/23/2022 Stage prefix: Initial diagnosis Histologic grading system: 3 grade system   03/17/2022 Initial Diagnosis   Breast cancer ([place])  Right breast mass biopsy showed  Invasive mammary carcinoma with lobular features.  Grade 2, no DCIS, no LVI.  ER 90% positive, PR negative, HER2 negative [facility] 0]     Menarche 81 years of age, Patient has 2 children.  Age at first birth 13 She has previously used birth control pills, no details of duration. She menopause in late 74s. She has a history of hysterectomy. Denies any previous hormone replacement therapy. Denies previous breast biopsy.  Family history is positive for paternal aunt and paternal grandma with breast cancer.  No other family history of  cancers.  Patient was accompanied by husband and 2 to the clinic today to discuss  management plan.    MEDICAL HISTORY:  Past Medical History:  Diagnosis Date   anesthesia    shaking after an injection prior to cataract surgery   Asthma    Cancer ([place])    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   Dysrhythmia    A-fib   GERD (gastroesophageal reflux disease)    Headache    Migraines   Heart murmur    Hyperlipidemia    Hypertension    Mitral valve prolapse    PONV (postoperative nausea and vomiting)     SURGICAL HISTORY: Past Surgical History:  Procedure Laterality Date   ABDOMINAL HYSTERECTOMY     BACK SURGERY  35 years   BOWEL RESECTION  09/09/2014   Procedure: , bladder instilation;  Surgeon: Dia Crawford III, MD;  Location: [facility] ORS;  Service: General;;   BREAST BIOPSY Right 03/17/2022   [place] Core Bx, coil clip 12;00, path pending   BREAST BIOPSY Right 03/17/2022   [place] RT BREAST BX W LOC DEV 1ST LESION IMG BX [place] US GUIDE 03/17/2022 [facility]   CARDIAC CATHETERIZATION Right 09/09/2014   Procedure: CENTRAL LINE INSERTION;  Surgeon: Dia Crawford III, MD;  Location: [facility] ORS;  Service: General;  Laterality: Right;   CATARACT EXTRACTION W/ INTRAOCULAR LENS IMPLANT Right    CATARACT EXTRACTION W/PHACO Left 05/11/2016   Procedure: CATARACT EXTRACTION PHACO AND INTRAOCULAR LENS PLACEMENT ([place]);  Surgeon: Estill Cotta, MD;  Location: [facility] ORS;  Service: Ophthalmology;  Laterality: Left;  [place] 1:25 AP% 25.4 CDE 36.38 Fluid pack lot # [id number]   JOINT REPLACEMENT Right    LAPAROTOMY N/A 09/09/2014   Procedure: EXPLORATORY LAPAROTOMY, small bowel resection;  Surgeon: Dia Crawford III, MD;  Location: [facility] ORS;  Service: General;  Laterality: N/A;   NASAL SINUS SURGERY      SOCIAL HISTORY: Social History   Socioeconomic History   Marital status: Married    Spouse name: Not on file   Number of children: Not on file   Years of education: Not on file   Highest education level:  Not on file  Occupational History   Not on file  Tobacco Use   Smoking status: Never   Smokeless tobacco: Never  Vaping Use   Vaping Use: Never used  Substance and Sexual Activity   Alcohol use: No   Drug use: No   Sexual activity: Not on file  Other Topics Concern   Not on file  Social History Narrative   Not on file   Social Determinants of Health   Financial Resource Strain: Not on file  Food Insecurity: Not on file  Transportation Needs: Not on file  Physical Activity: Not on file  Stress: Not on file  Social Connections: Not on file  Intimate Partner Violence: Not on file    FAMILY HISTORY: Family History  Problem Relation Age of Onset   Heart disease Mother    Hypertension Mother    Breast cancer Paternal Aunt 23       2 aunts   Breast cancer Paternal Grandmother 29    ALLERGIES:  is allergic to codeine, dilaudid [hydromorphone hcl], meperidine, and vicodin [hydrocodone-acetaminophen].  MEDICATIONS:  Current Outpatient Medications  Medication Sig Dispense Refill   amLODipine (NORVASC) 5 MG tablet Take 5 mg by mouth daily.     aspirin 81 MG tablet Take 81 mg by mouth daily.     metoprolol succinate (TOPROL-XL) 50 MG 24 hr tablet Take 50 mg by mouth 2 (two) times daily. Take with or immediately following a meal.     No current facility-administered medications for this visit.    Review of Systems  Constitutional:  Negative for appetite change, chills, fatigue and fever.  HENT:   Negative for hearing loss and voice change.   Eyes:  Negative for eye problems.  Respiratory:  Negative for chest tightness and cough.   Cardiovascular:  Negative for chest pain.  Gastrointestinal:  Negative for abdominal distention, abdominal pain and blood in stool.  Endocrine: Negative for hot flashes.  Genitourinary:  Negative for difficulty urinating and frequency.   Musculoskeletal:  Negative for arthralgias.  Skin:  Negative for itching and rash.  Neurological:  Negative  for extremity weakness.  Hematological:  Negative for adenopathy.  Psychiatric/Behavioral:  Negative for confusion.    PHYSICAL EXAMINATION: ECOG PERFORMANCE STATUS: 1 - Symptomatic but completely ambulatory Vitals:   03/22/22 1052  BP: (!) 130/50  Pulse: 62  Temp: 97.8 F (36.6 C)   There were no vitals filed for this visit.  Physical Exam Constitutional:      General: She is not in acute distress. HENT:     Head: Normocephalic and atraumatic.  Eyes:     General: No scleral icterus. Cardiovascular:     Rate and Rhythm: Normal rate and regular rhythm.     Heart sounds: Normal heart sounds.  Pulmonary:     Effort: Pulmonary effort is normal. No respiratory distress.  Breath sounds: No wheezing.  Abdominal:     General: Bowel sounds are normal. There is no distension.     Palpations: Abdomen is soft.  Musculoskeletal:        General: No deformity. Normal range of motion.     Cervical back: Normal range of motion and neck supple.  Skin:    General: Skin is warm and dry.     Findings: No erythema or rash.  Neurological:     Mental Status: She is alert and oriented to person, place, and time. Mental status is at baseline.     Cranial Nerves: No cranial nerve deficit.     Coordination: Coordination normal.  Psychiatric:        Mood and Affect: Mood normal.    Due to TFTDD22 policy, patient can only have one family member accompanied for her visit. We accommodated patient's requests of multiple family members attending today's encounter in a meeting room on the first floor of cancer center. Deferred physical examination due to no examination bed in the meeting room.    LABORATORY DATA:  I have reviewed the data as listed    Latest Ref Rng & Units 09/18/2014    5:45 AM 09/14/2014    4:10 AM 09/13/2014    8:12 AM  CBC  WBC 3.6 - 11.0 K/uL 12.4  12.2  12.7   Hemoglobin 12.0 - 16.0 g/dL 10.7  11.1  11.1   Hematocrit 35.0 - 47.0 % 31.9  33.1  33.9   Platelets 150 - 440 K/uL  427  341  313       Latest Ref Rng & Units 09/18/2014    5:45 AM 09/14/2014    4:10 AM 09/13/2014    8:12 AM  CMP  Glucose 65 - 99 mg/dL 123  143  160   BUN 6 - 20 mg/dL 6  <5  <5   Creatinine 0.44 - 1.00 mg/dL 0.52  0.41  0.44   Sodium 135 - 145 mmol/L 137  135  134   Potassium 3.5 - 5.1 mmol/L 3.9  3.7  3.1   Chloride 101 - 111 mmol/L 103  102  100   CO2 22 - 32 mmol/L _0 Calcium 8.9 - 10.3 mg/dL 8.4  7.5  7.4   Total Protein 6.5 - 8.1 g/dL 6.2     Total Bilirubin 0.3 - 1.2 mg/dL 0.9     Alkaline Phos 38 - 126 U/L 142     AST 15 - 41 U/L 21     ALT 14 - 54 U/L 24         RADIOGRAPHIC STUDIES: I have personally reviewed the radiological images as listed and agreed with the findings in the report. [place] RT BREAST BX W LOC DEV 1ST LESION IMG BX [place] US GUIDE  Addendum Date: 03/18/2022   ADDENDUM REPORT: 03/18/2022 15:39 ADDENDUM: PATHOLOGY revealed: A. Breast, RIGHT, mass; core biopsy: - INVASIVE MAMMARY CARCINOMA, WITH LOBULAR FEATURES. 4 mm in this sample. Grade 2 (of 3). Ductal carcinoma in situ: Not identified. Lymphovascular invasion: Not identified. Pathology results are CONCORDANT with imaging findings, per Dr. Valentino Saxon. Pathology results and recommendations were discussed with patient via telephone on 03/18/2022. Patient reported biopsy site doing well with no adverse symptoms, and only slight tenderness at the site. Post biopsy care instructions were reviewed, questions were answered and my direct phone number was provided. Patient was instructed to call [facility] for any  additional questions or concerns related to biopsy site. RECOMMENDATIONS: 1. Surgical and oncological consultation. Request for surgical and oncological consultation relayed to Casper Harrison RN at [facility] by Electa Sniff RN on 03/18/2022. 2. Consider bilateral breast MRI with and without contrast given lobular features. Pathology results reported by Electa Sniff RN on  03/18/2022. Electronically Signed   By: Valentino Saxon M.D.   On: 03/18/2022 15:39   Result Date: 03/18/2022 CLINICAL DATA:  Indeterminate RIGHT breast mass EXAM: ULTRASOUND GUIDED RIGHT BREAST CORE NEEDLE BIOPSY COMPARISON:  Previous exam(s). PROCEDURE: I met with the patient and we discussed the procedure of ultrasound-guided biopsy, including benefits and alternatives. We discussed the high likelihood of a successful procedure. We discussed the risks of the procedure, including infection, bleeding, tissue injury, clip migration, and inadequate sampling. Informed written consent was given. The usual time-out protocol was performed immediately prior to the procedure. Lesion quadrant: Upper outer quadrant Using sterile technique and 1% lidocaine and 1% lidocaine with epinephrine as local anesthetic, under direct ultrasound visualization, a 14 gauge spring-loaded device was used to perform biopsy of mass at 12 o'clock 3 cm from the nipple using a medial approach. At the conclusion of the procedure a COIL shaped tissue marker clip was deployed into the biopsy cavity. Follow up 2 view mammogram was performed and dictated separately. IMPRESSION: Ultrasound guided biopsy of a RIGHT breast mass. No apparent complications. Electronically Signed: By: Valentino Saxon M.D. On: 03/17/2022 09:50   MM CLIP PLACEMENT RIGHT  Result Date: 03/17/2022 CLINICAL DATA:  Status post ultrasound-guided biopsy EXAM: 3D DIAGNOSTIC RIGHT MAMMOGRAM POST ULTRASOUND BIOPSY COMPARISON:  Previous exam(s). FINDINGS: 3D Mammographic images were obtained following ultrasound guided biopsy of a RIGHT breast mass. The COIL biopsy marking clip is in expected position at the site of biopsy immediately posterior to a residual calcification. IMPRESSION: Appropriate positioning of the COIL shaped biopsy marking clip at the site of biopsy in the upper breast at site of screening mammographic concern. Final Assessment: Post Procedure Mammograms  for Marker Placement Electronically Signed   By: Valentino Saxon M.D.   On: 03/17/2022 10:17  MM DIAG BREAST TOMO UNI RIGHT  Result Date: 02/21/2022 CLINICAL DATA:  Patient returns today to evaluate a possible RIGHT breast asymmetry questioned on recent screening mammogram. EXAM: DIGITAL DIAGNOSTIC UNILATERAL RIGHT MAMMOGRAM WITH TOMOSYNTHESIS; ULTRASOUND RIGHT BREAST LIMITED TECHNIQUE: Right digital diagnostic mammography and breast tomosynthesis was performed.; Targeted ultrasound examination of the right breast was performed COMPARISON:  Previous exams including recent screening mammogram dated 01/19/2022. ACR Breast Density Category b: There are scattered areas of fibroglandular density. FINDINGS: On today's additional diagnostic views, a small irregular mass is confirmed within the upper RIGHT breast, 11-12 o'clock axis region based on tomosynthesis slice position, measuring approximately 4 mm greatest dimension, containing 2 punctate calcifications (true lateral slice 24 and spot compression MLO slice 22). Targeted ultrasound is performed, showing an irregular hypoechoic mass in the RIGHT breast at the 12 o'clock axis, 3 cm from the nipple, measuring 5 x 3 x 4 mm, containing punctate echogenic foci that likely represent calcifications, altogether a likely correlate for the mammographic finding. IMPRESSION: Irregular hypoechoic mass in the RIGHT breast at the 12 o'clock axis, 3 cm from the nipple, measuring 5 mm, a likely correlate for the mammographic finding. This is a suspicious finding for which ultrasound-guided biopsy is recommended. RECOMMENDATION: 1. Ultrasound-guided biopsy for the RIGHT breast mass at the 12 o'clock axis, 3 cm from the nipple, measuring 5 mm,  a likely correlate for the mammographic finding. 2. Close attention to the postprocedure mammogram to ensure mammographic and sonographic correspondence. If findings do not correspond, stereotactic biopsy would be recommended for the  mammographic finding. Patient is aware of this possibility. I have discussed the findings and recommendations with the patient and her family. If applicable, a reminder letter will be sent to the patient regarding the next appointment. BI-RADS CATEGORY  4: Suspicious. Electronically Signed   By: Franki Cabot M.D.   On: 02/21/2022 11:47  US BREAST LTD UNI RIGHT INC AXILLA  Result Date: 02/21/2022 CLINICAL DATA:  Patient returns today to evaluate a possible RIGHT breast asymmetry questioned on recent screening mammogram. EXAM: DIGITAL DIAGNOSTIC UNILATERAL RIGHT MAMMOGRAM WITH TOMOSYNTHESIS; ULTRASOUND RIGHT BREAST LIMITED TECHNIQUE: Right digital diagnostic mammography and breast tomosynthesis was performed.; Targeted ultrasound examination of the right breast was performed COMPARISON:  Previous exams including recent screening mammogram dated 01/19/2022. ACR Breast Density Category b: There are scattered areas of fibroglandular density. FINDINGS: On today's additional diagnostic views, a small irregular mass is confirmed within the upper RIGHT breast, 11-12 o'clock axis region based on tomosynthesis slice position, measuring approximately 4 mm greatest dimension, containing 2 punctate calcifications (true lateral slice 24 and spot compression MLO slice 22). Targeted ultrasound is performed, showing an irregular hypoechoic mass in the RIGHT breast at the 12 o'clock axis, 3 cm from the nipple, measuring 5 x 3 x 4 mm, containing punctate echogenic foci that likely represent calcifications, altogether a likely correlate for the mammographic finding. IMPRESSION: Irregular hypoechoic mass in the RIGHT breast at the 12 o'clock axis, 3 cm from the nipple, measuring 5 mm, a likely correlate for the mammographic finding. This is a suspicious finding for which ultrasound-guided biopsy is recommended. RECOMMENDATION: 1. Ultrasound-guided biopsy for the RIGHT breast mass at the 12 o'clock axis, 3 cm from the nipple,  measuring 5 mm, a likely correlate for the mammographic finding. 2. Close attention to the postprocedure mammogram to ensure mammographic and sonographic correspondence. If findings do not correspond, stereotactic biopsy would be recommended for the mammographic finding. Patient is aware of this possibility. I have discussed the findings and recommendations with the patient and her family. If applicable, a reminder letter will be sent to the patient regarding the next appointment. BI-RADS CATEGORY  4: Suspicious. Electronically Signed   By: Franki Cabot M.D.   On: 02/21/2022 11:47  MM 3D SCREEN BREAST BILATERAL  Result Date: 01/24/2022 CLINICAL DATA:  Screening. EXAM: DIGITAL SCREENING BILATERAL MAMMOGRAM WITH TOMOSYNTHESIS AND CAD TECHNIQUE: Bilateral screening digital craniocaudal and mediolateral oblique mammograms were obtained. Bilateral screening digital breast tomosynthesis was performed. The images were evaluated with computer-aided detection. COMPARISON:  Previous exam(s). ACR Breast Density Category a: The breast tissue is almost entirely fatty. FINDINGS: In the right breast, a possible asymmetry warrants further evaluation. In the left breast, no findings suspicious for malignancy. IMPRESSION: Further evaluation is suggested for possible asymmetry in the right breast. RECOMMENDATION: Diagnostic mammogram and possibly ultrasound of the right breast. (Code:[id number]) The patient will be contacted regarding the findings, and additional imaging will be scheduled. BI-RADS CATEGORY  0: Incomplete. Need additional imaging evaluation and/or prior mammograms for comparison. Electronically Signed   By: Marin Olp M.D.   On: 01/24/2022 13:58       ASSESSMENT & PLAN:   Cancer Staging  Breast cancer [facility]) Staging form: Breast, AJCC 8th Edition - Clinical stage from 03/17/2022: Stage IA (cT1a, cN0, cM0, G2, ER+, PR-, HER2-) - Signed  by Earlie Server, MD on 03/23/2022   Breast cancer [facility]) Pathology and  radiology counseling: Discussed with the patient, the details of pathology including the type of breast cancer,the clinical staging, the significance of ER, PR and HER-2/neu receptors and the implications for treatment. After reviewing the pathology in detail, we proceeded to discuss the different treatment options between surgery, radiation, chemotherapy, antiestrogen therapies.  I also discussed the case with surgery Dr. Peyton Najjar. We recommend MRI breast bilaterally for further evaluation due to the lobular features.  Treatment plan: 1.  Refer to surgery for breast conserving surgery, if axillar lymph node is negative on MRI, sentinel lymph node biopsy may be omitted due to her age >29 with early stage ER+ breast cancer.  2.  If final pathology confirms pT1a disease, no need for Oncotype DX testing 3.  Adjuvant radiation 4.  Adjuvant antiestrogen therapy  Return to clinic based upon surgery pathology  Goals of care, counseling/discussion Curative intent. Discussed with patient and family  Family history of cancer Recommend genetic testing.  Patient would like to defer for now and she will update our office if she decided to proceed.    Orders Placed This Encounter  Procedures   MR BREAST BILATERAL W WO CONTRAST INC CAD    Standing Status:   Future    Standing Expiration Date:   03/23/2023    Order Specific Question:   If indicated for the ordered procedure, I authorize the administration of contrast media per Radiology protocol    Answer:   Yes    Order Specific Question:   What is the patient's sedation requirement?    Answer:   No Sedation    Order Specific Question:   Does the patient have a pacemaker or implanted devices?    Answer:   No    Order Specific Question:   Preferred imaging location?    Answer:   [facility] (table limit - 550lbs)   Ambulatory referral to General Surgery    Referral Priority:   Routine    Referral Type:   Surgical    Referral Reason:    Specialty Services Required    Referred to Provider:   Herbert Pun, MD    Requested Specialty:   General Surgery    Number of Visits Requested:   1    All questions were answered. The patient knows to call the clinic with any problems, questions or concerns.  [name], MD   Return of visit: to be determined. Plan to see patient 2 weeks post op  Thank you for this kind referral and the opportunity to participate in the care of this patient. A copy of today's note is routed to referring provider   Earlie Server, MD, PhD [facility] Hematology Oncology 03/22/2022

## 2022-03-23 ENCOUNTER — Encounter: Payer: Self-pay | Admitting: Oncology

## 2022-03-23 DIAGNOSIS — Z7189 Other specified counseling: Secondary | ICD-10-CM | POA: Insufficient documentation

## 2022-03-23 DIAGNOSIS — C50919 Malignant neoplasm of unspecified site of unspecified female breast: Secondary | ICD-10-CM

## 2022-03-23 DIAGNOSIS — Z809 Family history of malignant neoplasm, unspecified: Secondary | ICD-10-CM | POA: Insufficient documentation

## 2022-03-23 HISTORY — DX: Malignant neoplasm of unspecified site of unspecified female breast: C50.919

## 2022-03-23 NOTE — Assessment & Plan Note (Signed)
Recommend genetic testing.  Patient would like to defer for now and she will update our office if she decided to proceed.

## 2022-03-23 NOTE — Assessment & Plan Note (Signed)
Curative intent. Discussed with patient and family

## 2022-03-23 NOTE — Assessment & Plan Note (Addendum)
Pathology and radiology counseling: Discussed with the patient, the details of pathology including the type of breast cancer,the clinical staging, the significance of ER, PR and HER-2/neu receptors and the implications for treatment. After reviewing the pathology in detail, we proceeded to discuss the different treatment options between surgery, radiation, chemotherapy, antiestrogen therapies.  I also discussed the case with surgery Dr. Peyton Najjar. We recommend MRI breast bilaterally for further evaluation due to the lobular features.  Treatment plan: 1.  Refer to surgery for breast conserving surgery, if axillar lymph node is negative on MRI, sentinel lymph node biopsy may be omitted due to her age >29 with early stage ER+ breast cancer.  2.  If final pathology confirms pT1a disease, no need for Oncotype DX testing 3.  Adjuvant radiation 4.  Adjuvant antiestrogen therapy  Return to clinic based upon surgery pathology

## 2022-03-24 ENCOUNTER — Other Ambulatory Visit: Payer: Self-pay | Admitting: General Surgery

## 2022-03-24 DIAGNOSIS — C50911 Malignant neoplasm of unspecified site of right female breast: Secondary | ICD-10-CM

## 2022-03-28 ENCOUNTER — Ambulatory Visit: Payer: Self-pay | Admitting: General Surgery

## 2022-03-28 ENCOUNTER — Other Ambulatory Visit: Payer: Self-pay

## 2022-03-28 ENCOUNTER — Encounter
Admission: RE | Admit: 2022-03-28 | Discharge: 2022-03-28 | Disposition: A | Discharge status: Home / Self Care | Payer: PPO | Source: Ambulatory Visit | Attending: General Surgery | Admitting: General Surgery

## 2022-03-28 HISTORY — DX: Acute embolism and thrombosis of unspecified deep veins of unspecified lower extremity: I82.409

## 2022-03-28 HISTORY — DX: Urinary tract infection, site not specified: N39.0

## 2022-03-28 HISTORY — DX: COVID-19: U07.1

## 2022-03-28 NOTE — Patient Instructions (Signed)
Your procedure is scheduled on: 04/05/22 Report to [place]. To find out your arrival time please call [phone] between 1PM - 3PM on 04/01/22.  Remember: Instructions that are not followed completely may result in serious medical risk, up to and including death, or upon the discretion of your surgeon and anesthesiologist your surgery may need to be rescheduled.     _X__ 1. Do not eat food or drink any liquids after midnight the night before your procedure.                 No gum chewing or hard candies.   __X__2.  On the morning of surgery brush your teeth with toothpaste and water, you                 may rinse your mouth with mouthwash if you wish.  Do not swallow any              toothpaste of mouthwash.     _X__ 3.  No Alcohol for 24 hours before or after surgery.   _X__ 4.  Do Not Smoke or use e-cigarettes For 24 Hours Prior to Your Surgery.                 Do not use any chewable tobacco products for at least 6 hours prior to                 surgery.  ____  5.  Bring all medications with you on the day of surgery if instructed.   __X__  6.  Notify your doctor if there is any change in your medical condition      (cold, fever, infections).     Do not wear jewelry, make-up, hairpins, clips or nail polish. Do not wear lotions, powders, or perfumes. No deodorant Do not shave body hair 48 hours prior to surgery. Men may shave face and neck. Do not bring valuables to the hospital.    [facility] is not responsible for any belongings or valuables.  Contacts, dentures/partials or body piercings may not be worn into surgery. Bring a case for your contacts, glasses or hearing aids, a denture cup will be supplied. Leave your suitcase in the car. After surgery it may be brought to your room. For patients admitted to the hospital, discharge time is determined by your treatment team.   Patients discharged the day of surgery will  not be allowed to drive home.    __X__ Take these medicines the morning of surgery with A SIP OF WATER:    1. metoprolol succinate (TOPROL-XL) 50 MG 24 hr tablet   2.   3.   4.  5.  6.  ____ Fleet Enema (as directed)   ____ Use CHG Soap/SAGE wipes as directed  ____ Use inhalers on the day of surgery  ____ Stop metformin/Janumet/Farxiga 2 days prior to surgery    ____ Take 1/2 of usual insulin dose the night before surgery. No insulin the morning          of surgery.   ____ Stop Blood Thinners Coumadin/Plavix/Xarelto/Pleta/Pradaxa/Eliquis/Effient/Aspirin  on   Or contact your Surgeon, Cardiologist or Medical Doctor regarding  ability to stop your blood thinners  ____ Stop Anti-inflammatories 7 days before surgery such as Advil, Ibuprofen, Motrin,  BC or Goodies Powder, Naprosyn, Naproxen, Aleve, Aspirin    __X__ Stop all herbals and supplements, fish oil or vitamins  until after surgery.  ____ Bring C-Pap to the hospital.    Hold Aspirin and/or Excedrin starting 03/31/22. You may use Tylenol if needed.

## 2022-03-31 ENCOUNTER — Ambulatory Visit
Admission: RE | Admit: 2022-03-31 | Discharge: 2022-03-31 | Disposition: A | Discharge status: Home / Self Care | Payer: PPO | Source: Ambulatory Visit | Attending: General Surgery | Admitting: General Surgery

## 2022-03-31 ENCOUNTER — Ambulatory Visit
Admission: RE | Admit: 2022-03-31 | Discharge: 2022-03-31 | Disposition: A | Discharge status: Home / Self Care | Payer: PPO | Source: Ambulatory Visit | Attending: Oncology | Admitting: Oncology

## 2022-03-31 DIAGNOSIS — R928 Other abnormal and inconclusive findings on diagnostic imaging of breast: Secondary | ICD-10-CM | POA: Diagnosis not present

## 2022-03-31 DIAGNOSIS — C50919 Malignant neoplasm of unspecified site of unspecified female breast: Secondary | ICD-10-CM | POA: Insufficient documentation

## 2022-03-31 DIAGNOSIS — C50911 Malignant neoplasm of unspecified site of right female breast: Secondary | ICD-10-CM | POA: Diagnosis not present

## 2022-03-31 HISTORY — PX: BREAST LUMPECTOMY WITH RADIOFREQUENCY TAG IDENTIFICATION: SHX6884

## 2022-03-31 HISTORY — PX: BREAST BIOPSY: SHX20

## 2022-03-31 MED ORDER — LIDOCAINE HCL (PF) 1 % IJ SOLN
5.0000 mL | Freq: Once | INTRAMUSCULAR | Status: AC
  Administered 2022-03-31: 5 mL

## 2022-03-31 MED ORDER — GADOBUTROL 1 MMOL/ML IV SOLN
6.0000 mL | Freq: Once | INTRAVENOUS | Status: AC | PRN
  Administered 2022-03-31: 6 mL via INTRAVENOUS

## 2022-04-01 ENCOUNTER — Encounter: Payer: Self-pay | Admitting: General Surgery

## 2022-04-05 ENCOUNTER — Ambulatory Visit
Admission: RE | Admit: 2022-04-05 | Discharge: 2022-04-05 | Disposition: A | Discharge status: Home / Self Care | Payer: PPO | Source: Ambulatory Visit | Attending: General Surgery | Admitting: General Surgery

## 2022-04-05 ENCOUNTER — Ambulatory Visit
Admission: RE | Admit: 2022-04-05 | Discharge: 2022-04-05 | Disposition: A | Discharge status: Home / Self Care | Payer: PPO | Attending: General Surgery | Admitting: General Surgery

## 2022-04-05 ENCOUNTER — Ambulatory Visit: Payer: PPO | Admitting: Anesthesiology

## 2022-04-05 ENCOUNTER — Other Ambulatory Visit: Payer: Self-pay

## 2022-04-05 ENCOUNTER — Encounter: Payer: Self-pay | Admitting: General Surgery

## 2022-04-05 ENCOUNTER — Encounter
Admission: RE | Disposition: A | Discharge status: Home / Self Care | Payer: Self-pay | Source: Home / Self Care | Attending: General Surgery

## 2022-04-05 DIAGNOSIS — Z86718 Personal history of other venous thrombosis and embolism: Secondary | ICD-10-CM | POA: Insufficient documentation

## 2022-04-05 DIAGNOSIS — I4891 Unspecified atrial fibrillation: Secondary | ICD-10-CM | POA: Diagnosis not present

## 2022-04-05 DIAGNOSIS — C50911 Malignant neoplasm of unspecified site of right female breast: Secondary | ICD-10-CM | POA: Diagnosis not present

## 2022-04-05 DIAGNOSIS — J45909 Unspecified asthma, uncomplicated: Secondary | ICD-10-CM | POA: Diagnosis not present

## 2022-04-05 DIAGNOSIS — Z79899 Other long term (current) drug therapy: Secondary | ICD-10-CM | POA: Insufficient documentation

## 2022-04-05 DIAGNOSIS — K219 Gastro-esophageal reflux disease without esophagitis: Secondary | ICD-10-CM | POA: Insufficient documentation

## 2022-04-05 DIAGNOSIS — Z8616 Personal history of COVID-19: Secondary | ICD-10-CM | POA: Insufficient documentation

## 2022-04-05 DIAGNOSIS — R928 Other abnormal and inconclusive findings on diagnostic imaging of breast: Secondary | ICD-10-CM | POA: Diagnosis not present

## 2022-04-05 DIAGNOSIS — E785 Hyperlipidemia, unspecified: Secondary | ICD-10-CM | POA: Diagnosis not present

## 2022-04-05 DIAGNOSIS — Z96651 Presence of right artificial knee joint: Secondary | ICD-10-CM | POA: Insufficient documentation

## 2022-04-05 DIAGNOSIS — Z170 Estrogen receptor positive status [ER+]: Secondary | ICD-10-CM | POA: Insufficient documentation

## 2022-04-05 DIAGNOSIS — I10 Essential (primary) hypertension: Secondary | ICD-10-CM | POA: Insufficient documentation

## 2022-04-05 DIAGNOSIS — Z85828 Personal history of other malignant neoplasm of skin: Secondary | ICD-10-CM | POA: Insufficient documentation

## 2022-04-05 DIAGNOSIS — C50811 Malignant neoplasm of overlapping sites of right female breast: Secondary | ICD-10-CM | POA: Diagnosis not present

## 2022-04-05 HISTORY — PX: BREAST LUMPECTOMY: SHX2

## 2022-04-05 SURGERY — PARTIAL MASTECTOMY WITH RADIO FREQUENCY LOCALIZER
Anesthesia: General | Site: Breast | Laterality: Right

## 2022-04-05 MED ORDER — SEVOFLURANE IN SOLN
RESPIRATORY_TRACT | Status: AC
  Filled 2022-04-05: qty 250

## 2022-04-05 MED ORDER — PROPOFOL 1000 MG/100ML IV EMUL
INTRAVENOUS | Status: AC
  Filled 2022-04-05: qty 100

## 2022-04-05 MED ORDER — FENTANYL CITRATE (PF) 100 MCG/2ML IJ SOLN
INTRAMUSCULAR | Status: AC
  Filled 2022-04-05: qty 2

## 2022-04-05 MED ORDER — KETAMINE HCL 10 MG/ML IJ SOLN
INTRAMUSCULAR | Status: DC | PRN
  Administered 2022-04-05: 20 mg via INTRAVENOUS

## 2022-04-05 MED ORDER — KETAMINE HCL 50 MG/5ML IJ SOSY
PREFILLED_SYRINGE | INTRAMUSCULAR | Status: AC
  Filled 2022-04-05: qty 5

## 2022-04-05 MED ORDER — CEFAZOLIN SODIUM-DEXTROSE 2-4 GM/100ML-% IV SOLN
INTRAVENOUS | Status: AC
  Filled 2022-04-05: qty 100

## 2022-04-05 MED ORDER — FAMOTIDINE 20 MG PO TABS: 20.0000 mg | ORAL_TABLET | Freq: Once | ORAL | Status: AC

## 2022-04-05 MED ORDER — TRAMADOL HCL 50 MG PO TABS: 50.0000 mg | ORAL_TABLET | Freq: Four times a day (QID) | ORAL | 0 refills | Status: DC | PRN

## 2022-04-05 MED ORDER — HEMOSTATIC AGENTS (NO CHARGE) OPTIME
TOPICAL | Status: DC | PRN
  Administered 2022-04-05: 1 via TOPICAL

## 2022-04-05 MED ORDER — LIDOCAINE HCL (PF) 2 % IJ SOLN
INTRAMUSCULAR | Status: AC
  Filled 2022-04-05: qty 5

## 2022-04-05 MED ORDER — CHLORHEXIDINE GLUCONATE 0.12 % MT SOLN: 15.0000 mL | Freq: Once | OROMUCOSAL | Status: AC

## 2022-04-05 MED ORDER — ONDANSETRON HCL 4 MG/2ML IJ SOLN: 4.0000 mg | Freq: Once | INTRAMUSCULAR | Status: DC | PRN

## 2022-04-05 MED ORDER — DEXAMETHASONE SODIUM PHOSPHATE 10 MG/ML IJ SOLN
INTRAMUSCULAR | Status: AC
  Filled 2022-04-05: qty 1

## 2022-04-05 MED ORDER — GLYCOPYRROLATE 0.2 MG/ML IJ SOLN
INTRAMUSCULAR | Status: DC | PRN
  Administered 2022-04-05: .1 mg via INTRAVENOUS

## 2022-04-05 MED ORDER — ORAL CARE MOUTH RINSE: 15.0000 mL | Freq: Once | OROMUCOSAL | Status: AC

## 2022-04-05 MED ORDER — PROPOFOL 10 MG/ML IV BOLUS
INTRAVENOUS | Status: DC | PRN
  Administered 2022-04-05: 60 mg via INTRAVENOUS

## 2022-04-05 MED ORDER — PROPOFOL 1000 MG/100ML IV EMUL
INTRAVENOUS | Status: AC
  Filled 2022-04-05: qty 300

## 2022-04-05 MED ORDER — BUPIVACAINE-EPINEPHRINE (PF) 0.25% -1:200000 IJ SOLN
INTRAMUSCULAR | Status: DC | PRN
  Administered 2022-04-05: 21 mL

## 2022-04-05 MED ORDER — DEXAMETHASONE SODIUM PHOSPHATE 10 MG/ML IJ SOLN
INTRAMUSCULAR | Status: DC | PRN
  Administered 2022-04-05: 5 mg via INTRAVENOUS

## 2022-04-05 MED ORDER — ONDANSETRON HCL 4 MG/2ML IJ SOLN
INTRAMUSCULAR | Status: DC | PRN
  Administered 2022-04-05: 4 mg via INTRAVENOUS

## 2022-04-05 MED ORDER — LIDOCAINE HCL (CARDIAC) PF 100 MG/5ML IV SOSY
PREFILLED_SYRINGE | INTRAVENOUS | Status: DC | PRN
  Administered 2022-04-05: 20 mg via INTRAVENOUS
  Administered 2022-04-05: 60 mg via INTRAVENOUS

## 2022-04-05 MED ORDER — PHENYLEPHRINE HCL-NACL 20-0.9 MG/250ML-% IV SOLN
INTRAVENOUS | Status: DC | PRN
  Administered 2022-04-05: 25 ug/min via INTRAVENOUS

## 2022-04-05 MED ORDER — PHENYLEPHRINE HCL (PRESSORS) 10 MG/ML IV SOLN
INTRAVENOUS | Status: AC
  Filled 2022-04-05: qty 1

## 2022-04-05 MED ORDER — FENTANYL CITRATE (PF) 100 MCG/2ML IJ SOLN
INTRAMUSCULAR | Status: AC
  Administered 2022-04-05: 25 ug via INTRAVENOUS
  Filled 2022-04-05: qty 2

## 2022-04-05 MED ORDER — STERILE WATER FOR IRRIGATION IR SOLN
Status: DC | PRN
  Administered 2022-04-05: 500 mL

## 2022-04-05 MED ORDER — LIDOCAINE HCL (PF) 2 % IJ SOLN
INTRAMUSCULAR | Status: AC
  Filled 2022-04-05: qty 10

## 2022-04-05 MED ORDER — FENTANYL CITRATE (PF) 100 MCG/2ML IJ SOLN
25.0000 ug | INTRAMUSCULAR | Status: DC | PRN
  Administered 2022-04-05 (×3): 25 ug via INTRAVENOUS

## 2022-04-05 MED ORDER — LACTATED RINGERS IV SOLN: INTRAVENOUS | Status: DC

## 2022-04-05 MED ORDER — ONDANSETRON HCL 4 MG/2ML IJ SOLN
INTRAMUSCULAR | Status: AC
  Filled 2022-04-05: qty 2

## 2022-04-05 MED ORDER — PHENYLEPHRINE 80 MCG/ML (10ML) SYRINGE FOR IV PUSH (FOR BLOOD PRESSURE SUPPORT)
PREFILLED_SYRINGE | INTRAVENOUS | Status: AC
  Filled 2022-04-05: qty 20

## 2022-04-05 MED ORDER — CHLORHEXIDINE GLUCONATE 0.12 % MT SOLN
OROMUCOSAL | Status: AC
  Administered 2022-04-05: 15 mL via OROMUCOSAL
  Filled 2022-04-05: qty 15

## 2022-04-05 MED ORDER — ACETAMINOPHEN 10 MG/ML IV SOLN
INTRAVENOUS | Status: DC | PRN
  Administered 2022-04-05: 1000 mg via INTRAVENOUS

## 2022-04-05 MED ORDER — FENTANYL CITRATE (PF) 100 MCG/2ML IJ SOLN
INTRAMUSCULAR | Status: DC | PRN
  Administered 2022-04-05 (×2): 12.5 ug via INTRAVENOUS
  Administered 2022-04-05: 25 ug via INTRAVENOUS

## 2022-04-05 MED ORDER — ROCURONIUM BROMIDE 10 MG/ML (PF) SYRINGE
PREFILLED_SYRINGE | INTRAVENOUS | Status: AC
  Filled 2022-04-05: qty 10

## 2022-04-05 MED ORDER — PROPOFOL 500 MG/50ML IV EMUL
INTRAVENOUS | Status: DC | PRN
  Administered 2022-04-05: 150 ug/kg/min via INTRAVENOUS

## 2022-04-05 MED ORDER — ACETAMINOPHEN 10 MG/ML IV SOLN
INTRAVENOUS | Status: AC
  Filled 2022-04-05: qty 100

## 2022-04-05 MED ORDER — CEFAZOLIN SODIUM-DEXTROSE 2-4 GM/100ML-% IV SOLN
2.0000 g | INTRAVENOUS | Status: AC
  Administered 2022-04-05: 2 g via INTRAVENOUS

## 2022-04-05 MED ORDER — GLYCOPYRROLATE 0.2 MG/ML IJ SOLN
INTRAMUSCULAR | Status: AC
  Filled 2022-04-05: qty 1

## 2022-04-05 MED ORDER — VASOPRESSIN 20 UNIT/ML IV SOLN
INTRAVENOUS | Status: AC
  Filled 2022-04-05: qty 1

## 2022-04-05 MED ORDER — FAMOTIDINE 20 MG PO TABS
ORAL_TABLET | ORAL | Status: AC
  Administered 2022-04-05: 20 mg via ORAL
  Filled 2022-04-05: qty 1

## 2022-04-05 SURGICAL SUPPLY — 46 items
ADH SKN CLS APL DERMABOND .7 (GAUZE/BANDAGES/DRESSINGS) ×1
APL PRP STRL LF DISP 70% ISPRP (MISCELLANEOUS) ×1
BLADE SURG 15 STRL LF DISP TIS (BLADE) ×1 IMPLANT
BLADE SURG 15 STRL SS (BLADE) ×1
CHLORAPREP W/TINT 26 (MISCELLANEOUS) ×1 IMPLANT
CNTNR SPEC 2.5X3XGRAD LEK (MISCELLANEOUS)
CONT SPEC 4OZ STRL OR WHT (MISCELLANEOUS)
CONTAINER SPEC 2.5X3XGRAD LEK (MISCELLANEOUS) ×1 IMPLANT
DERMABOND ADVANCED .7 DNX12 (GAUZE/BANDAGES/DRESSINGS) ×1 IMPLANT
DEVICE DUBIN SPECIMEN MAMMOGRA (MISCELLANEOUS) ×1 IMPLANT
DRAPE LAPAROTOMY TRNSV 106X77 (MISCELLANEOUS) ×1 IMPLANT
ELECT CAUTERY BLADE TIP 2.5 (TIP) ×1
ELECT REM PT RETURN 9FT ADLT (ELECTROSURGICAL) ×1
ELECTRODE CAUTERY BLDE TIP 2.5 (TIP) ×1 IMPLANT
ELECTRODE REM PT RTRN 9FT ADLT (ELECTROSURGICAL) ×1 IMPLANT
GAUZE 4X4 16PLY ~~LOC~~+RFID DBL (SPONGE) ×1 IMPLANT
GLOVE BIO SURGEON STRL SZ 6.5 (GLOVE) ×1 IMPLANT
GLOVE BIOGEL PI IND STRL 6.5 (GLOVE) ×1 IMPLANT
GOWN STRL REUS W/ TWL LRG LVL3 (GOWN DISPOSABLE) ×3 IMPLANT
GOWN STRL REUS W/TWL LRG LVL3 (GOWN DISPOSABLE) ×3
HEMOSTAT ARISTA ABSORB 3G PWDR (HEMOSTASIS) IMPLANT
KIT MARKER MARGIN INK (KITS) IMPLANT
KIT TURNOVER KIT A (KITS) ×1 IMPLANT
LABEL OR SOLS (LABEL) ×1 IMPLANT
MANIFOLD NEPTUNE II (INSTRUMENTS) ×1 IMPLANT
MARKER MARGIN CORRECT CLIP (MARKER) IMPLANT
NDL HYPO 25X1 1.5 SAFETY (NEEDLE) ×1 IMPLANT
NEEDLE HYPO 25X1 1.5 SAFETY (NEEDLE) ×1 IMPLANT
PACK BASIN MINOR ARMC (MISCELLANEOUS) ×1 IMPLANT
RETRACTOR RING XSMALL (MISCELLANEOUS) IMPLANT
RTRCTR WOUND ALEXIS 13CM XS SH (MISCELLANEOUS)
SET LOCALIZER 20 PROBE US (MISCELLANEOUS) ×1 IMPLANT
SUT MNCRL 4-0 (SUTURE) ×1
SUT MNCRL 4-0 27XMFL (SUTURE) ×1
SUT SILK 2 0 SH (SUTURE) ×1 IMPLANT
SUT VIC AB 2-0 SH 27 (SUTURE) ×1
SUT VIC AB 2-0 SH 27XBRD (SUTURE) IMPLANT
SUT VIC AB 3-0 SH 27 (SUTURE) ×1
SUT VIC AB 3-0 SH 27X BRD (SUTURE) ×1 IMPLANT
SUTURE MNCRL 4-0 27XMF (SUTURE) ×1 IMPLANT
SYR 10ML LL (SYRINGE) ×1 IMPLANT
SYR BULB IRRIG 60ML STRL (SYRINGE) ×1 IMPLANT
TRAP FLUID SMOKE EVACUATOR (MISCELLANEOUS) ×1 IMPLANT
TRAP NEPTUNE SPECIMEN COLLECT (MISCELLANEOUS) ×1 IMPLANT
WATER STERILE IRR 1000ML POUR (IV SOLUTION) ×1 IMPLANT
WATER STERILE IRR 500ML POUR (IV SOLUTION) ×1 IMPLANT

## 2022-04-05 NOTE — Anesthesia Postprocedure Evaluation (Signed)
Anesthesia Post Note  Patient: Hannah [name]  Procedure(s) Performed: PARTIAL MASTECTOMY WITH RADIO FREQUENCY LOCALIZER (Right: Breast)  Patient location during evaluation: PACU Anesthesia Type: General Level of consciousness: awake and alert, oriented and patient cooperative Pain management: pain level controlled Vital Signs Assessment: post-procedure vital signs reviewed and stable Respiratory status: spontaneous breathing, nonlabored ventilation and respiratory function stable Cardiovascular status: blood pressure returned to baseline and stable Postop Assessment: adequate PO intake Anesthetic complications: no   No notable events documented.   Last Vitals:  Vitals:   04/05/22 1210 04/05/22 1219  BP: (!) 125/56 (!) 161/51  Pulse: 62 (!) 59  Resp: 14 16  Temp:  36.7 C  SpO2: 94% 99%    Last Pain:  Vitals:   04/05/22 1219  TempSrc:   PainSc: 0-No pain                 Darrin Nipper

## 2022-04-05 NOTE — Anesthesia Preprocedure Evaluation (Addendum)
Anesthesia Evaluation  Patient identified by MRN, date of birth, ID band Patient awake    Reviewed: Allergy & Precautions, NPO status , Patient's Chart, lab work & pertinent test results  History of Anesthesia Complications (+) PONV and history of anesthetic complications  Airway Mallampati: II  TM Distance: >3 FB Neck ROM: Full    Dental  (+) Upper Dentures, Lower Dentures   Pulmonary neg pulmonary ROS, asthma    Pulmonary exam normal breath sounds clear to auscultation       Cardiovascular Exercise Tolerance: Good hypertension, Pt. on medications Normal cardiovascular exam Rhythm:Regular Rate:Normal     Neuro/Psych  Headaches negative neurological ROS  negative psych ROS   GI/Hepatic negative GI ROS, Neg liver ROS,GERD  Medicated,,  Endo/Other  negative endocrine ROS    Renal/GU negative Renal ROS  negative genitourinary   Musculoskeletal   Abdominal Normal abdominal exam  (+)   Peds  Hematology negative hematology ROS (+)   Anesthesia Other Findings Past Medical History: No date: anesthesia     Comment:  shaking after an injection prior to cataract surgery No date: Asthma 03/23/2022: Breast cancer ([place]) No date: Cancer ([place])     Comment:  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 No date: Chronic UTI No date: COVID No date: DVT (deep venous thrombosis) (HCC)     Comment:  after knee surgery No date: Dysrhythmia     Comment:  A-fib No date: GERD (gastroesophageal reflux disease) No date: Headache     Comment:  Migraines No date: Heart murmur No date: Hyperlipidemia No date: Hypertension No date: Mitral valve prolapse No date: PONV (postoperative nausea and vomiting)  Past Surgical History: No date: ABDOMINAL HYSTERECTOMY 35 years: BACK SURGERY 09/09/2014: BOWEL RESECTION     Comment:  Procedure: , bladder instilation;  Surgeon: Dia Crawford               III, MD;  Location: [facility] ORS;  Service:  General;; 03/17/2022: BREAST BIOPSY; Right     Comment:  [place] Core Bx, coil clip 12;00, path pending 03/17/2022: BREAST BIOPSY; Right     Comment:  [place] RT BREAST BX W LOC DEV 1ST LESION IMG BX [place]               GUIDE 03/17/2022 [facility] 03/31/2022: BREAST BIOPSY; Right     Comment:  MM RT RADIO FREQUENCY TAG LOC MAMMO GUIDE 03/31/2022               [facility] 03/31/2022: BREAST LUMPECTOMY WITH RADIOFREQUENCY TAG IDENTIFICATION;  Right     Comment:  hologic tag [id number] 09/09/2014: CARDIAC CATHETERIZATION; Right     Comment:  Procedure: CENTRAL LINE INSERTION;  Surgeon: Dia Crawford               III, MD;  Location: [facility] ORS;  Service: General;                Laterality: Right; No date: CATARACT EXTRACTION W/ INTRAOCULAR LENS IMPLANT; Right 05/11/2016: CATARACT EXTRACTION W/PHACO; Left     Comment:  Procedure: CATARACT EXTRACTION PHACO AND INTRAOCULAR               LENS PLACEMENT (IOC);  Surgeon: Estill Cotta, MD;                Location: [facility] ORS;  Service: Ophthalmology;  Laterality:              Left;  [place] 1:25 AP% 25.4 CDE 36.38 Fluid pack lot #  [id number] No date: JOINT REPLACEMENT; Right     Comment:  knee 09/09/2014: LAPAROTOMY; N/A     Comment:  Procedure: EXPLORATORY LAPAROTOMY, small bowel               resection;  Surgeon: Dia Crawford III, MD;  Location: [facility]               ORS;  Service: General;  Laterality: N/A; No date: NASAL SINUS SURGERY  BMI    Body Mass Index: 21.45 kg/m      Reproductive/Obstetrics negative OB ROS                              Anesthesia Physical Anesthesia Plan  ASA: 2  Anesthesia Plan: General   Post-op Pain Management:    Induction: Intravenous  PONV Risk Score and Plan: Dexamethasone, Ondansetron, Midazolam and Treatment may vary due to age or medical condition  Airway Management Planned: LMA  Additional Equipment:   Intra-op Plan:   Post-operative Plan: Extubation in  OR  Informed Consent: I have reviewed the patients History and Physical, chart, labs and discussed the procedure including the risks, benefits and alternatives for the proposed anesthesia with the patient or authorized representative who has indicated his/her understanding and acceptance.     Dental Advisory Given  Plan Discussed with: CRNA and Surgeon  Anesthesia Plan Comments:         Anesthesia Quick Evaluation

## 2022-04-05 NOTE — Transfer of Care (Signed)
Immediate Anesthesia Transfer of Care Note  Patient: Hannah [name]  Procedure(s) Performed: PARTIAL MASTECTOMY WITH RADIO FREQUENCY LOCALIZER (Right: Breast)  Patient Location: PACU  Anesthesia Type:General  Level of Consciousness: drowsy and patient cooperative  Airway & Oxygen Therapy: Patient Spontanous Breathing  Post-op Assessment: Report given to RN and Post -op Vital signs reviewed and stable  Post vital signs: Reviewed and stable  Last Vitals:  Vitals Value Taken Time  BP 156/56 04/05/22 1130  Temp    Pulse 69 04/05/22 1132  Resp 16 04/05/22 1132  SpO2 99 % 04/05/22 1132  Vitals shown include unvalidated device data.  Last Pain:  Vitals:   04/05/22 0932  TempSrc: Temporal  PainSc: 0-No pain         Complications: No notable events documented.

## 2022-04-05 NOTE — Op Note (Signed)
Preoperative diagnosis: Right breast carcinoma.  Postoperative diagnosis: Same.   Procedure: Right radiofrequency tag-localized partial mastectomy.                        Anesthesia: GETA  Surgeon: Dr. Windell Moment  Wound Classification: Clean  Indications: Patient is a 81 y.o. female with a nonpalpable right breast mass noted on mammography with core biopsy demonstrating invasive lobular carcinoma requires radiofrequency tag-localized partial mastectomy for treatment   Findings: 1. Specimen mammography shows marker and tag on specimen 2. Pathology call refers gross examination of margins was grossly negative  3. No other palpable mass or lymph node identified.   Description of procedure: Preoperative radiofrequency tag localization was performed by radiology. Localization studies were reviewed. The patient was taken to the operating room and placed supine on the operating table, and after general anesthesia the right chest and axilla were prepped and draped in the usual sterile fashion. A time-out was completed verifying correct patient, procedure, site, positioning, and implant(s) and/or special equipment prior to beginning this procedure.  By comparing the localization studies and interrogation with Localizer device, the probable trajectory and location of the mass was visualized. A circumareolar skin incision was planned in such a way as to minimize the amount of dissection to reach the mass.  The skin incision was made. Flaps were raised and the location of the tag was confirmed with Localizer device confirmed. A 2-0 silk figure-of-eight stay suture was placed and used for retraction. Dissection was then taken down circumferentially, taking care to include the entire localizing tag and a wide margin of grossly normal tissue. The specimen and entire localizing tag were removed. The specimen was oriented and sent to radiology with the localization studies. Confirmation was received that the  entire target lesion had been resected. The wound was irrigated. Hemostasis was checked. The wound was closed with interrupted sutures of 3-0 Vicryl and a subcuticular suture of Monocryl 3-0. No attempt was made to close the dead space.    Specimen: Right Breast mass                     Complications: None  Estimated Blood Loss: 5 mL

## 2022-04-05 NOTE — H&P (Signed)
PATIENT PROFILE: Hannah [name] is a 81 y.o. female who presents to the Clinic for consultation at the request of Dr. Tasia [name] for evaluation of right breast cancer.  PCP: Hannah Pax, MD  HISTORY OF PRESENT ILLNESS: Hannah [name] reports she had her usual screening mammogram. On the screening mammogram she was found with asymmetry of the right breast. This led to diagnostic mammogram and ultrasound. This showed a 5 mm mass of the right breast at the 12 o'clock position. Core biopsy showed invasive mammary carcinoma, with lobular features.  Patient denies any breast pain, palpable masses, skin changes or nipple retraction.  Family history of breast cancer: Aunt from father side Family history of other cancers: None Menarche: 81 years old Menopause: 81 years old Used OCP: Yes Used estrogen and progesterone therapy: No History of Radiation to the chest: No Number of pregnancies: 2 completed, 1 miscarriage No previous breast biopsy.  PROBLEM LIST: Problem List Date Reviewed: 10/18/2021   Noted  Adult idiopathic generalized osteoporosis 07/06/2020  Overview  3/22, Fosamax started    Vitamin D deficiency 07/03/2019  Overview  23, 2016    History of 2019 novel coronavirus disease (COVID-19) 05/02/2019  Overview  1/21, very symptomatic from both vaccines    Lumbar radiculopathy 05/09/2018  Overview  Postop    B12 deficiency 12/28/2017  Overview  217, 9/19    Panlobular emphysema (CMS-HCC) 12/28/2017  Medicare annual wellness visit, initial 07/04/2016  Overview  3/18, 3/19, 3/20, 3/21, 3/22, 4/23    Hyperlipidemia, mixed 12/22/2015  Overview  Simvastatin Normal Myoview/echo 6/21    Atrial tachycardia 03/27/2014  Overview  MVP    Migraine Unknown  Essential hypertension, benign 08/30/2013   GENERAL REVIEW OF SYSTEMS:   General ROS: negative for - chills, fatigue, fever, weight gain or weight loss Allergy and Immunology ROS: negative for - hives   Hematological and Lymphatic ROS: negative for - bleeding problems or bruising, negative for palpable nodes Endocrine ROS: negative for - heat or cold intolerance, hair changes Respiratory ROS: negative for - cough, shortness of breath or wheezing Cardiovascular ROS: no chest pain or palpitations GI ROS: negative for nausea, vomiting, abdominal pain, diarrhea, constipation Musculoskeletal ROS: negative for - joint swelling or muscle pain Neurological ROS: negative for - confusion, syncope Dermatological ROS: negative for pruritus and rash Psychiatric: negative for anxiety, depression, difficulty sleeping and memory loss  MEDICATIONS: Current Outpatient Medications  Medication Sig Dispense Refill  alendronate (FOSAMAX) 70 MG tablet TAKE 1 TABLET (70 MG TOTAL) BY MOUTH EVERY 7 (SEVEN) DAYS TAKE WITH A FULL GLASS OF WATER. DO NOT LIE DOWN FOR THE NEXT 30 MIN. 12 tablet 3  amLODIPine (NORVASC) 5 MG tablet TAKE 1 TABLET BY MOUTH EVERY DAY AT NIGHT 90 tablet 3  aspirin 81 MG EC tablet Take 81 mg by mouth once daily.  azelastine (ASTELIN) 137 mcg nasal spray  cholecalciferol, vitamin D3, (VITAMIN D3) 125 mcg (5,000 unit) tablet Take by mouth once daily.   cyanocobalamin, vitamin B-12, 2,500 mcg Lozg Place 1 lozenge under the tongue 3 (three) times a week  metoprolol succinate (TOPROL-XL) 50 MG XL tablet TAKE 1 TABLET BY MOUTH THREE TIMES A DAY 270 tablet 3  omeprazole (PRILOSEC) 40 MG DR capsule Take 1 capsule (40 mg total) by mouth once daily (Patient taking differently: Take 40 mg by mouth once daily as needed) 90 capsule 3   No current facility-administered medications for this visit.   ALLERGIES: Dilaudid [hydromorphone], Hydrocodone-acetaminophen, Hydromorphone (bulk), Codeine, and Demerol [  meperidine]  PAST MEDICAL HISTORY: Past Medical History:  Diagnosis Date  Atrial tachycardia 03/27/2014  MVP  Essential hypertension, benign 08/30/2013  Interstitial cystitis  Migraine  Mild  intermittent reactive airway disease without complication 3/32/9518  Other and unspecified hyperlipidemia 08/30/2013  Personal history of ovarian cyst  Reflux esophagitis  Restrictive airway disease  Shingles  Unspecified vitamin D deficiency 08/30/2013   PAST SURGICAL HISTORY: Past Surgical History:  Procedure Laterality Date  Left knee arthroscopy 1997  Right knee arthroscopy 05/09/2008  Right total knee arthroplasty 02/08/2010  Dr. Marry Guan  RESECTION SMALL BOWEL 6/16  diverticular rupture  SKIN CANCER REMOVAL 11/2018  upper back  BLADDER TACK  CATARACT EXTRACTION  HYSTERECTOMY  anterior repair, TV[facility]  LAMINECTOMY LUMBAR SPINE  NASAL SURGERY  TONSILLECTOMY  TUBAL LIGATION    FAMILY HISTORY: Family History  Problem Relation Age of Onset  Diabetes Father  Diabetes type II Father  High blood pressure (Hypertension) Father  Allergies Mother  Asthma Mother  Myocardial Infarction (Heart attack) Mother  High blood pressure (Hypertension) Mother  Stroke Mother    SOCIAL HISTORY: Social History   Socioeconomic History  Marital status: Married  Spouse name: Hannah [name]  Number of children: 2  Years of education: 1  Occupational History  Occupation: Retired  Tobacco Use  Smoking status: Never  Smokeless tobacco: Never  Surveyor, mining Use: Never used  Substance and Sexual Activity  Alcohol use: No  Drug use: Never  Sexual activity: Defer  Partners: Male  Birth control/protection: Surgical  Social History Narrative  Feels safe in home.   PHYSICAL EXAM: Vitals:  03/24/22 1056  BP: 115/57  Pulse: 63   Body mass index is 19.47 kg/m. Weight: 53.1 kg (117 lb)   GENERAL: Alert, active, oriented x3  HEENT: Pupils equal reactive to light. Extraocular movements are intact. Sclera clear. Palpebral conjunctiva normal red color.Pharynx clear.  NECK: Supple with no palpable mass and no adenopathy.  LUNGS: Sound clear with no rales rhonchi or wheezes.  HEART:  Regular rhythm S1 and S2 without murmur.  BREAST: breasts appear normal, no suspicious masses, no skin or nipple changes or axillary nodes.  ABDOMEN: Soft and depressible, nontender with no palpable mass, no hepatomegaly.  EXTREMITIES: Well-developed well-nourished symmetrical with no dependent edema.  NEUROLOGICAL: Awake alert oriented, facial expression symmetrical, moving all extremities.  REVIEW OF DATA: I have reviewed the following data today: Appointment on 03/14/2022  Component Date Value  WBC (White Blood Cell Co* 03/14/2022 7.0  RBC (Red Blood Cell Coun* 03/14/2022 4.57  Hemoglobin 03/14/2022 13.5  Hematocrit 03/14/2022 42.7  MCV (Mean Corpuscular Vo* 03/14/2022 93.4  MCH (Mean Corpuscular He* 03/14/2022 29.5  MCHC (Mean Corpuscular H* 03/14/2022 31.6 (L)  Platelet Count 03/14/2022 264  RDW-CV (Red Cell Distrib* 03/14/2022 14.7  MPV (Mean Platelet Volum* 03/14/2022 12.0  Neutrophils 03/14/2022 4.50  Lymphocytes 03/14/2022 1.70  Monocytes 03/14/2022 0.54  Eosinophils 03/14/2022 0.16  Basophils 03/14/2022 0.06  Neutrophil % 03/14/2022 64.4  Lymphocyte % 03/14/2022 24.4  Monocyte % 03/14/2022 7.7  Eosinophil % 03/14/2022 2.3  Basophil% 03/14/2022 0.9  Immature Granulocyte % 03/14/2022 0.3  Immature Granulocyte Cou* 03/14/2022 0.02  Glucose 03/14/2022 94  Sodium 03/14/2022 145  Potassium 03/14/2022 4.6  Chloride 03/14/2022 107  Carbon Dioxide (CO2) 03/14/2022 31.3  Urea Nitrogen (BUN) 03/14/2022 13  Creatinine 03/14/2022 0.6  Glomerular Filtration Ra* 03/14/2022 90  Calcium 03/14/2022 9.3  AST 03/14/2022 13  ALT 03/14/2022 11  Alk Phos (alkaline Phosp* 03/14/2022 45  Albumin 03/14/2022  4.3  Bilirubin, Total 03/14/2022 0.5  Protein, Total 03/14/2022 6.5  A/G Ratio 03/14/2022 2.0  Cholesterol, Total 03/14/2022 216 (H)  Vitamin B12 03/14/2022 453  Color 03/14/2022 Light Yellow  Clarity 03/14/2022 Clear  Specific Gravity 03/14/2022 1.013  pH, Urine  03/14/2022 6.0  Protein, Urinalysis 03/14/2022 Negative  Glucose, Urinalysis 03/14/2022 Negative  Ketones, Urinalysis 03/14/2022 Negative  Blood, Urinalysis 03/14/2022 Negative  Nitrite, Urinalysis 03/14/2022 Negative  Leukocyte Esterase, Urin* 03/14/2022 Negative  Bilirubin, Urinalysis 03/14/2022 Negative  Urobilinogen, Urinalysis 03/14/2022 0.2  WBC, UA 03/14/2022 0  Red Blood Cells, Urinaly* 03/14/2022 0  Bacteria, Urinalysis 03/14/2022 0-5  Squamous Epithelial Cell* 03/14/2022 0  Thyroid Stimulating Horm* 03/14/2022 1.336  Vitamin D, 25-Hydroxy - * 03/14/2022 31.7  Office Visit on 02/16/2022  Component Date Value  Influenza A PCR 02/16/2022 Negative  Influenza B PCR 02/16/2022 Negative  RSV PCR 02/16/2022 Negative  SARS-CoV2 PCR 02/16/2022 Positive (!)    ASSESSMENT: Ms. Prout is a 81 y.o. female presenting for consultation for right breast cancer.   Patient was oriented again about the pathology results. Surgical alternatives were discussed with patient including partial vs total mastectomy. Surgical technique and post operative care was discussed with patient. Risk of surgery was discussed with patient including but not limited to: wound infection, seroma, hematoma, brachial plexopathy, mondor's disease (thrombosis of small veins of breast), chronic wound pain, breast lymphedema, altered sensation to the nipple and cosmesis among others.   Due to the fact the patient has a level of future patient will benefit of preoperative MRI. There is a higher increase of multifocal, multicentric and you have bilateral breast cancer with patient with lobular carcinoma. Patient understand the recommendation of the MRI. If MRI negative for any more disease patient was oriented about partial mastectomy and will proceed with partial mastectomy. Due to small size (5 mm) and patient, patient more than 81 years old, low risk of axillary disease, sentinel node biopsy could be avoided. If there is any  abnormal finding on MRI, we can always include the sentinel node biopsy.  MRI came back negative for any additional pathology. No lymphadenopathy. Will proceed with partial mastectomy and avoid sentinel lymph node biopsy. Patient understood and agreed.   Invasive lobular carcinoma of right breast in female (CMS-HCC) [C50.911]  PLAN: Right breast radiofrequency tagged partial mastectomy (30076)   Patient verbalized understanding, all questions were answered, and were agreeable with the plan outlined above.   Herbert Pun, MD

## 2022-04-05 NOTE — Discharge Instructions (Addendum)
  Diet: Resume home heart healthy regular diet.   Activity: No heavy lifting >20 pounds (children, pets, laundry, garbage) or strenuous activity until follow-up, but light activity and walking are encouraged. Do not drive or drink alcohol if taking narcotic pain medications.  Wound care: May shower with soapy water and pat dry (do not rub incisions), but no baths or submerging incision underwater until follow-up. (no swimming)   Medications: Resume all home medications. For mild to moderate pain: acetaminophen (Tylenol) or ibuprofen (if no kidney disease). Combining Tylenol with alcohol can substantially increase your risk of causing liver disease. Narcotic pain medications, if prescribed, can be used for severe pain, though may cause nausea, constipation, and drowsiness.  If you do not need the narcotic pain medication, you do not need to fill the prescription.  Call office [phone]) at any time if any questions, worsening pain, fevers/chills, bleeding, drainage from incision site, or other concerns.  AMBULATORY SURGERY  DISCHARGE INSTRUCTIONS   The drugs that you were given will stay in your system until tomorrow so for the next 24 hours you should not:  Drive an automobile Make any legal decisions Drink any alcoholic beverage   You may resume regular meals tomorrow.  Today it is better to start with liquids and gradually work up to solid foods.  You may eat anything you prefer, but it is better to start with liquids, then soup and crackers, and gradually work up to solid foods.   Please notify your doctor immediately if you have any unusual bleeding, trouble breathing, redness and pain at the surgery site, drainage, fever, or pain not relieved by medication.    Additional Instructions:  May alternate '1000mg'$  of tylenol every 8 with ibuprofen (up to '800mg'$ ) every 8 hours   Please contact your physician with any problems or Same Day Surgery at [phone], Monday through  Friday 6 am to 4 pm, or [place] at [facility] number at [phone].

## 2022-04-06 ENCOUNTER — Other Ambulatory Visit: Payer: Self-pay | Admitting: Anatomic Pathology & Clinical Pathology

## 2022-04-06 LAB — SURGICAL PATHOLOGY

## 2022-04-07 ENCOUNTER — Telehealth: Payer: Self-pay | Admitting: Oncology

## 2022-04-07 ENCOUNTER — Encounter: Payer: Self-pay | Admitting: *Deleted

## 2022-04-07 DIAGNOSIS — C50911 Malignant neoplasm of unspecified site of right female breast: Secondary | ICD-10-CM

## 2022-04-07 NOTE — Telephone Encounter (Signed)
Communication recieved to schedule pt, Pt has been scheduled and will be notified based on communication via [place] by AM

## 2022-04-07 NOTE — Progress Notes (Signed)
Pathology is back, per Dr. Tasia Catchings no oncotype needed for pT1a tumor.   Referral placed to radiation.   Appointments scheduled for Dr. Tasia Catchings and Dr. Baruch Gouty on 04/20/22.  Appointment details given to daughter [name].

## 2022-04-20 ENCOUNTER — Inpatient Hospital Stay: Payer: PPO | Attending: Oncology | Admitting: Oncology

## 2022-04-20 ENCOUNTER — Encounter: Payer: Self-pay | Admitting: Radiation Oncology

## 2022-04-20 ENCOUNTER — Encounter: Payer: Self-pay | Admitting: *Deleted

## 2022-04-20 ENCOUNTER — Ambulatory Visit
Admission: RE | Admit: 2022-04-20 | Discharge: 2022-04-20 | Disposition: A | Discharge status: Home / Self Care | Payer: PPO | Source: Ambulatory Visit | Attending: Radiation Oncology | Admitting: Radiation Oncology

## 2022-04-20 VITALS — BP 144/51 | HR 60 | Temp 97.7°F | Resp 14 | Wt 114.6 lb

## 2022-04-20 VITALS — BP 144/51 | HR 60 | Temp 97.7°F | Resp 14 | Ht 66.5 in | Wt 114.6 lb

## 2022-04-20 DIAGNOSIS — C50811 Malignant neoplasm of overlapping sites of right female breast: Secondary | ICD-10-CM | POA: Insufficient documentation

## 2022-04-20 DIAGNOSIS — Z803 Family history of malignant neoplasm of breast: Secondary | ICD-10-CM | POA: Insufficient documentation

## 2022-04-20 DIAGNOSIS — C50911 Malignant neoplasm of unspecified site of right female breast: Secondary | ICD-10-CM | POA: Insufficient documentation

## 2022-04-20 DIAGNOSIS — Z809 Family history of malignant neoplasm, unspecified: Secondary | ICD-10-CM | POA: Diagnosis not present

## 2022-04-20 DIAGNOSIS — I10 Essential (primary) hypertension: Secondary | ICD-10-CM | POA: Insufficient documentation

## 2022-04-20 DIAGNOSIS — Z90710 Acquired absence of both cervix and uterus: Secondary | ICD-10-CM | POA: Diagnosis not present

## 2022-04-20 DIAGNOSIS — Z86718 Personal history of other venous thrombosis and embolism: Secondary | ICD-10-CM | POA: Insufficient documentation

## 2022-04-20 DIAGNOSIS — C50011 Malignant neoplasm of nipple and areola, right female breast: Secondary | ICD-10-CM | POA: Diagnosis not present

## 2022-04-20 DIAGNOSIS — Z170 Estrogen receptor positive status [ER+]: Secondary | ICD-10-CM | POA: Diagnosis not present

## 2022-04-20 NOTE — Progress Notes (Signed)
Patient's daughter [name] called to ask if more then one person could accompany her to the appointments today.   Sent secure chat to Dr. Collie Siad team and Dr. Olena Leatherwood team along with registration and Blima Singer.   It was approved for more then one visitor to come with her today.

## 2022-04-21 ENCOUNTER — Telehealth: Payer: Self-pay | Admitting: Oncology

## 2022-04-21 NOTE — Telephone Encounter (Signed)
Called pt to notify her that [facility] needed to know if she has had a bone density before. LVM for pt with req for pt to give [place] a call back or to call [facility] directly to schedule Bone Density.

## 2022-04-22 ENCOUNTER — Encounter: Payer: Self-pay | Admitting: Oncology

## 2022-04-22 NOTE — Assessment & Plan Note (Signed)
Recommend genetic testing, patient declined.

## 2022-04-22 NOTE — Assessment & Plan Note (Addendum)
Pathology was reviewed and discussed with patient and her family members.  pT1a pNx, no adjuvant chemotherapy needed.  Recommend adjuvant radiation.  We discussed about the rationale of adjuvant endocrine therapy Recommend DEXA

## 2022-04-22 NOTE — Progress Notes (Signed)
Hematology/Oncology Consult note [phone] [phone]         Patient Care Team: [name], MD as PCP - General (Internal Medicine)  CHIEF COMPLAINTS/REASON FOR VISIT:  Evaluation of right breast cancer   ASSESSMENT & PLAN:   Breast cancer [facility]) Pathology was reviewed and discussed with patient and her family members.  pT1a pNx, no adjuvant chemotherapy needed.  Recommend adjuvant radiation.  We discussed about the rationale of adjuvant endocrine therapy Recommend DEXA   Family history of cancer Recommend genetic testing, patient declined.    Orders Placed This Encounter  Procedures   DG Bone Density    Standing Status:   Future    Standing Expiration Date:   04/21/2023    Order Specific Question:   Reason for Exam (SYMPTOM  OR DIAGNOSIS REQUIRED)    Answer:   breast cancer    Order Specific Question:   Preferred imaging location?    Answer:   [place]   Follow up after radiation.  All questions were answered. The patient knows to call the clinic with any problems, questions or concerns.  Earlie Server, MD, PhD [facility] Hematology Oncology 04/20/2022   HISTORY OF PRESENTING ILLNESS:   Hannah [name] is a  82 y.o.  female present for follow up of  right breast cancer.   Oncology History  Breast cancer ([place])  01/24/2022 Imaging   Bilateral screening mammogram  In the right breast, a possible asymmetry warrants further evaluation. In the left breast, no findings suspicious for malignancy.    02/21/2022 Imaging   Unilateral breast mammogram and ultrasound  Showed irregular hypoechoic mass in the RIGHT breast at the 12 o'clock axis, 3 cm from the nipple, measuring 5 mm, a likely correlate for the mammographic finding. This is a suspicious finding for which ultrasound-guided biopsy is recommended.    03/17/2022 Cancer Staging   Staging form: Breast, AJCC 8th Edition - Clinical stage from 03/17/2022: Stage IA (cT1a, cN0,  cM0, G2, ER+, PR-, HER2-) - Signed by Earlie Server, MD on 03/23/2022 Stage prefix: Initial diagnosis Histologic grading system: 3 grade system   03/17/2022 Initial Diagnosis   Breast cancer ([place])  Right breast mass biopsy showed  Invasive mammary carcinoma with lobular features.  Grade 2, no DCIS, no LVI.  ER 90% positive, PR negative, HER2 negative [facility] 0]   04/05/2022 Surgery   Right lumpectomy showed  Invasive lobular carcinoma, grade 2, no DCIS, no LVI, negative margin pT1a pNx   04/05/2022 Cancer Staging   Staging form: Breast, AJCC 8th Edition - Pathologic stage from 04/05/2022: Stage Unknown (pT1a, pNX, cM0, G2, ER+, PR-, HER2-) - Signed by Earlie Server, MD on 04/20/2022 Stage prefix: Initial diagnosis Multigene prognostic tests performed: None Histologic grading system: 3 grade system     Menarche 82 years of age, Patient has 2 children.  Age at first birth 43 She has previously used birth control pills, no details of duration. She menopause in late 75s. She has a history of hysterectomy. Denies any previous hormone replacement therapy. Denies previous breast biopsy.  Family history is positive for paternal aunt and paternal grandma with breast cancer.  No other family history of cancers.  INTERVAL HISTORY Hannah [name] is a 82 y.o. female who has above history reviewed by me today presents for follow up visit for right breast cancer S/p lumpectomy.  Patient was accompanied by family members to discuss results and adjuvant plan.      MEDICAL HISTORY:  Past Medical History:  Diagnosis Date   anesthesia    shaking after an injection prior to cataract surgery   Asthma    Breast cancer ([place]) 03/23/2022   Cancer ([place])    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   Chronic UTI    COVID    DVT (deep venous thrombosis) ([facility])    after knee surgery   Dysrhythmia    A-fib   GERD (gastroesophageal reflux disease)    Headache    Migraines   Heart murmur     Hyperlipidemia    Hypertension    Mitral valve prolapse    PONV (postoperative nausea and vomiting)     SURGICAL HISTORY: Past Surgical History:  Procedure Laterality Date   ABDOMINAL HYSTERECTOMY     BACK SURGERY  35 years   BOWEL RESECTION  09/09/2014   Procedure: , bladder instilation;  Surgeon: Dia Crawford III, MD;  Location: [facility] ORS;  Service: General;;   BREAST BIOPSY Right 03/17/2022   [place] Core Bx, coil clip 12;00, path pending   BREAST BIOPSY Right 03/17/2022   [place] RT BREAST BX W LOC DEV 1ST LESION IMG BX [place] US GUIDE 03/17/2022 [facility]   BREAST BIOPSY Right 03/31/2022   MM RT RADIO FREQUENCY TAG LOC MAMMO GUIDE 03/31/2022 [facility]   BREAST LUMPECTOMY WITH RADIOFREQUENCY TAG IDENTIFICATION Right 03/31/2022   hologic tag [id number]   CARDIAC CATHETERIZATION Right 09/09/2014   Procedure: CENTRAL LINE INSERTION;  Surgeon: Dia Crawford III, MD;  Location: [facility] ORS;  Service: General;  Laterality: Right;   CATARACT EXTRACTION W/ INTRAOCULAR LENS IMPLANT Right    CATARACT EXTRACTION W/PHACO Left 05/11/2016   Procedure: CATARACT EXTRACTION PHACO AND INTRAOCULAR LENS PLACEMENT ([place]);  Surgeon: Estill Cotta, MD;  Location: [facility] ORS;  Service: Ophthalmology;  Laterality: Left;  [place] 1:25 AP% 25.4 CDE 36.38 Fluid pack lot # [id number]   JOINT REPLACEMENT Right    knee   LAPAROTOMY N/A 09/09/2014   Procedure: EXPLORATORY LAPAROTOMY, small bowel resection;  Surgeon: Dia Crawford III, MD;  Location: [facility] ORS;  Service: General;  Laterality: N/A;   NASAL SINUS SURGERY      SOCIAL HISTORY: Social History   Socioeconomic History   Marital status: Married    Spouse name: Not on file   Number of children: Not on file   Years of education: Not on file   Highest education level: Not on file  Occupational History   Not on file  Tobacco Use   Smoking status: Never   Smokeless tobacco: Never  Vaping Use   Vaping Use: Never used  Substance and Sexual Activity   Alcohol use: No    Drug use: No   Sexual activity: Not on file  Other Topics Concern   Not on file  Social History Narrative   Not on file   Social Determinants of Health   Financial Resource Strain: Not on file  Food Insecurity: Not on file  Transportation Needs: Not on file  Physical Activity: Not on file  Stress: Not on file  Social Connections: Not on file  Intimate Partner Violence: Not on file    FAMILY HISTORY: Family History  Problem Relation Age of Onset   Heart disease Mother    Hypertension Mother    Breast cancer Paternal Aunt 56       2 aunts   Breast cancer Paternal Grandmother 74    ALLERGIES:  is allergic to codeine, dilaudid [hydromorphone hcl], meperidine, and vicodin [hydrocodone-acetaminophen].  MEDICATIONS:  Current  Outpatient Medications  Medication Sig Dispense Refill   alendronate (FOSAMAX) 70 MG tablet Take 70 mg by mouth once a week. Take with a full glass of water on an empty stomach.     amLODipine (NORVASC) 5 MG tablet Take 5 mg by mouth daily.     aspirin 81 MG tablet Take 81 mg by mouth daily.     Aspirin-Acetaminophen-Caffeine (EXCEDRIN PO) Take 1 tablet by mouth as needed (headaches).     metoprolol succinate (TOPROL-XL) 50 MG 24 hr tablet Take 50 mg by mouth 2 (two) times daily. Take with or immediately following a meal.     traMADol (ULTRAM) 50 MG tablet Take 1 tablet (50 mg total) by mouth every 6 (six) hours as needed. (Patient not taking: Reported on 04/20/2022) 20 tablet 0   No current facility-administered medications for this visit.    Review of Systems  Constitutional:  Negative for appetite change, chills, fatigue and fever.  HENT:   Negative for hearing loss and voice change.   Eyes:  Negative for eye problems.  Respiratory:  Negative for chest tightness and cough.   Cardiovascular:  Negative for chest pain.  Gastrointestinal:  Negative for abdominal distention, abdominal pain and blood in stool.  Endocrine: Negative for hot flashes.   Genitourinary:  Negative for difficulty urinating and frequency.   Musculoskeletal:  Negative for arthralgias.  Skin:  Negative for itching and rash.  Neurological:  Negative for extremity weakness.  Hematological:  Negative for adenopathy.  Psychiatric/Behavioral:  Negative for confusion.    PHYSICAL EXAMINATION: ECOG PERFORMANCE STATUS: 1 - Symptomatic but completely ambulatory Vitals:   04/20/22 1423  BP: (!) 144/51  Pulse: 60  Resp: 14  Temp: 97.7 F (36.5 C)   Filed Weights   04/20/22 1423  Weight: 114 lb 9.6 oz (52 kg)    Physical Exam Constitutional:      General: She is not in acute distress. HENT:     Head: Normocephalic and atraumatic.  Eyes:     General: No scleral icterus. Cardiovascular:     Rate and Rhythm: Normal rate and regular rhythm.     Heart sounds: Normal heart sounds.  Pulmonary:     Effort: Pulmonary effort is normal. No respiratory distress.     Breath sounds: No wheezing.  Abdominal:     General: Bowel sounds are normal. There is no distension.     Palpations: Abdomen is soft.  Musculoskeletal:        General: No deformity. Normal range of motion.     Cervical back: Normal range of motion and neck supple.  Skin:    General: Skin is warm and dry.     Findings: No erythema or rash.  Neurological:     Mental Status: She is alert and oriented to person, place, and time. Mental status is at baseline.     Cranial Nerves: No cranial nerve deficit.     Coordination: Coordination normal.  Psychiatric:        Mood and Affect: Mood normal.    Due to [id number] policy, patient can only have one family member accompanied for her visit. We accommodated patient's requests of multiple family members attending today's encounter in a meeting room on the first floor of cancer center. Deferred physical examination due to no examination bed in the meeting room.    LABORATORY DATA:  I have reviewed the data as listed    Latest Ref Rng & Units 09/18/2014     5:45 AM  09/14/2014    4:10 AM 09/13/2014    8:12 AM  CBC  WBC 3.6 - 11.0 K/uL 12.4  12.2  12.7   Hemoglobin 12.0 - 16.0 g/dL 10.7  11.1  11.1   Hematocrit 35.0 - 47.0 % 31.9  33.1  33.9   Platelets 150 - 440 K/uL 427  341  313       Latest Ref Rng & Units 09/18/2014    5:45 AM 09/14/2014    4:10 AM 09/13/2014    8:12 AM  CMP  Glucose 65 - 99 mg/dL 123  143  160   BUN 6 - 20 mg/dL 6  <5  <5   Creatinine 0.44 - 1.00 mg/dL 0.52  0.41  0.44   Sodium 135 - 145 mmol/L 137  135  134   Potassium 3.5 - 5.1 mmol/L 3.9  3.7  3.1   Chloride 101 - 111 mmol/L 103  102  100   CO2 22 - 32 mmol/L '25  29  29   '$ Calcium 8.9 - 10.3 mg/dL 8.4  7.5  7.4   Total Protein 6.5 - 8.1 g/dL 6.2     Total Bilirubin 0.3 - 1.2 mg/dL 0.9     Alkaline Phos 38 - 126 U/L 142     AST 15 - 41 U/L 21     ALT 14 - 54 U/L 24         RADIOGRAPHIC STUDIES: I have personally reviewed the radiological images as listed and agreed with the findings in the report. MM Breast Surgical Specimen  Result Date: 04/05/2022 CLINICAL DATA:  Status post localized RIGHT breast lumpectomy. EXAM: SPECIMEN RADIOGRAPH OF THE RIGHT BREAST COMPARISON:  Previous exam(s). FINDINGS: Status post excision of the RIGHT breast. The RF device and coil shaped clip are present within the specimen. IMPRESSION: Specimen radiograph of the RIGHT breast. Electronically Signed   By: Nolon Nations M.D.   On: 04/05/2022 11:49  MM RT RADIO FREQUENCY TAG LOC MAMMO GUIDE  Result Date: 03/31/2022 CLINICAL DATA:  Preoperative localization prior to right breast lumpectomy. EXAM: MAMMOGRAPHIC GUIDED RADIOFREQUENCY DEVICE LOCALIZATION OF THE RIGHT BREAST COMPARISON:  Previous exam(s). FINDINGS: Patient presents for radiofrequency device localization prior to right breast lumpectomy. I met with the patient and we discussed the procedure of radiofrequency device localization including benefits and alternatives. We discussed the high likelihood of a successful procedure. We  discussed the risks of the procedure including infection, bleeding, tissue injury and further surgery. Informed, written consent was given. The usual time-out protocol was performed immediately prior to the procedure. Using mammographic guidance, sterile technique, 1% lidocaine as local anesthesia, a Savi Scout device / radiofrequency tag was used to localize coil shaped post biopsy marker using a superior approach. The follow-up mammogram images confirm that the RF device is in the expected location and are marked for Dr. Windell Moment. Follow-up survey of the patient confirms the presence of the RF device. The patient tolerated the procedure well and was released from the Breast Center. IMPRESSION: Radiofrequency device localization of the the right breast. No apparent complications. Electronically Signed   By: Fidela Salisbury M.D.   On: 03/31/2022 16:08  MR BREAST BILATERAL W WO CONTRAST INC CAD  Result Date: 03/31/2022 CLINICAL DATA:  Staging exam. Newly diagnosed right breast invasive lobular carcinoma. EXAM: BILATERAL BREAST MRI WITH AND WITHOUT CONTRAST TECHNIQUE: Multiplanar, multisequence MR images of both breasts were obtained prior to and following the intravenous administration of 6 ml of Gadavist Three-dimensional MR  images were rendered by post-processing of the original MR data on an independent workstation. The three-dimensional MR images were interpreted, and findings are reported in the following complete MRI report for this study. Three dimensional images were evaluated at the independent interpreting workstation using the DynaCAD thin client. COMPARISON:  None available. FINDINGS: Breast composition: b. Scattered fibroglandular tissue. Background parenchymal enhancement: Mild Right breast: There is susceptibility artifact consistent with biopsy marking clip in the upper central right breast. There is an associated 3 mm focus of enhancement (series 10, image 68). There is no additional  suspicious enhancing mass or non mass enhancement in the right breast. Left breast: No mass or abnormal enhancement. Lymph nodes: No abnormal appearing lymph nodes. Ancillary findings:  None. IMPRESSION: 1. There is a 3 mm enhancing focus in the upper central right breast consistent with biopsy proven malignancy. 2. No additional suspicious findings in either breast. 3. No axillary adenopathy. RECOMMENDATION: Continue with treatment plan for known right breast cancer. BI-RADS CATEGORY  6: Known biopsy-proven malignancy. Electronically Signed   By: Audie Pinto M.D.   On: 03/31/2022 11:01  [place] RT BREAST BX W LOC DEV 1ST LESION IMG BX [place] US GUIDE  Addendum Date: 03/18/2022   ADDENDUM REPORT: 03/18/2022 15:39 ADDENDUM: PATHOLOGY revealed: A. Breast, RIGHT, mass; core biopsy: - INVASIVE MAMMARY CARCINOMA, WITH LOBULAR FEATURES. 4 mm in this sample. Grade 2 (of 3). Ductal carcinoma in situ: Not identified. Lymphovascular invasion: Not identified. Pathology results are CONCORDANT with imaging findings, per Dr. Valentino Saxon. Pathology results and recommendations were discussed with patient via telephone on 03/18/2022. Patient reported biopsy site doing well with no adverse symptoms, and only slight tenderness at the site. Post biopsy care instructions were reviewed, questions were answered and my direct phone number was provided. Patient was instructed to call [facility] for any additional questions or concerns related to biopsy site. RECOMMENDATIONS: 1. Surgical and oncological consultation. Request for surgical and oncological consultation relayed to Casper Harrison RN at [facility] by Electa Sniff RN on 03/18/2022. 2. Consider bilateral breast MRI with and without contrast given lobular features. Pathology results reported by Electa Sniff RN on 03/18/2022. Electronically Signed   By: Valentino Saxon M.D.   On: 03/18/2022 15:39   Result Date: 03/18/2022 CLINICAL DATA:   Indeterminate RIGHT breast mass EXAM: ULTRASOUND GUIDED RIGHT BREAST CORE NEEDLE BIOPSY COMPARISON:  Previous exam(s). PROCEDURE: I met with the patient and we discussed the procedure of ultrasound-guided biopsy, including benefits and alternatives. We discussed the high likelihood of a successful procedure. We discussed the risks of the procedure, including infection, bleeding, tissue injury, clip migration, and inadequate sampling. Informed written consent was given. The usual time-out protocol was performed immediately prior to the procedure. Lesion quadrant: Upper outer quadrant Using sterile technique and 1% lidocaine and 1% lidocaine with epinephrine as local anesthetic, under direct ultrasound visualization, a 14 gauge spring-loaded device was used to perform biopsy of mass at 12 o'clock 3 cm from the nipple using a medial approach. At the conclusion of the procedure a COIL shaped tissue marker clip was deployed into the biopsy cavity. Follow up 2 view mammogram was performed and dictated separately. IMPRESSION: Ultrasound guided biopsy of a RIGHT breast mass. No apparent complications. Electronically Signed: By: Valentino Saxon M.D. On: 03/17/2022 09:50  MM CLIP PLACEMENT RIGHT  Result Date: 03/17/2022 CLINICAL DATA:  Status post ultrasound-guided biopsy EXAM: 3D DIAGNOSTIC RIGHT MAMMOGRAM POST ULTRASOUND BIOPSY COMPARISON:  Previous exam(s). FINDINGS: 3D Mammographic  images were obtained following ultrasound guided biopsy of a RIGHT breast mass. The COIL biopsy marking clip is in expected position at the site of biopsy immediately posterior to a residual calcification. IMPRESSION: Appropriate positioning of the COIL shaped biopsy marking clip at the site of biopsy in the upper breast at site of screening mammographic concern. Final Assessment: Post Procedure Mammograms for Marker Placement Electronically Signed   By: Valentino Saxon M.D.   On: 03/17/2022 10:17  MM DIAG BREAST TOMO UNI  RIGHT  Result Date: 02/21/2022 CLINICAL DATA:  Patient returns today to evaluate a possible RIGHT breast asymmetry questioned on recent screening mammogram. EXAM: DIGITAL DIAGNOSTIC UNILATERAL RIGHT MAMMOGRAM WITH TOMOSYNTHESIS; ULTRASOUND RIGHT BREAST LIMITED TECHNIQUE: Right digital diagnostic mammography and breast tomosynthesis was performed.; Targeted ultrasound examination of the right breast was performed COMPARISON:  Previous exams including recent screening mammogram dated 01/19/2022. ACR Breast Density Category b: There are scattered areas of fibroglandular density. FINDINGS: On today's additional diagnostic views, a small irregular mass is confirmed within the upper RIGHT breast, 11-12 o'clock axis region based on tomosynthesis slice position, measuring approximately 4 mm greatest dimension, containing 2 punctate calcifications (true lateral slice 24 and spot compression MLO slice 22). Targeted ultrasound is performed, showing an irregular hypoechoic mass in the RIGHT breast at the 12 o'clock axis, 3 cm from the nipple, measuring 5 x 3 x 4 mm, containing punctate echogenic foci that likely represent calcifications, altogether a likely correlate for the mammographic finding. IMPRESSION: Irregular hypoechoic mass in the RIGHT breast at the 12 o'clock axis, 3 cm from the nipple, measuring 5 mm, a likely correlate for the mammographic finding. This is a suspicious finding for which ultrasound-guided biopsy is recommended. RECOMMENDATION: 1. Ultrasound-guided biopsy for the RIGHT breast mass at the 12 o'clock axis, 3 cm from the nipple, measuring 5 mm, a likely correlate for the mammographic finding. 2. Close attention to the postprocedure mammogram to ensure mammographic and sonographic correspondence. If findings do not correspond, stereotactic biopsy would be recommended for the mammographic finding. Patient is aware of this possibility. I have discussed the findings and recommendations with the patient  and her family. If applicable, a reminder letter will be sent to the patient regarding the next appointment. BI-RADS CATEGORY  4: Suspicious. Electronically Signed   By: Franki Cabot M.D.   On: 02/21/2022 11:47  US BREAST LTD UNI RIGHT INC AXILLA  Result Date: 02/21/2022 CLINICAL DATA:  Patient returns today to evaluate a possible RIGHT breast asymmetry questioned on recent screening mammogram. EXAM: DIGITAL DIAGNOSTIC UNILATERAL RIGHT MAMMOGRAM WITH TOMOSYNTHESIS; ULTRASOUND RIGHT BREAST LIMITED TECHNIQUE: Right digital diagnostic mammography and breast tomosynthesis was performed.; Targeted ultrasound examination of the right breast was performed COMPARISON:  Previous exams including recent screening mammogram dated 01/19/2022. ACR Breast Density Category b: There are scattered areas of fibroglandular density. FINDINGS: On today's additional diagnostic views, a small irregular mass is confirmed within the upper RIGHT breast, 11-12 o'clock axis region based on tomosynthesis slice position, measuring approximately 4 mm greatest dimension, containing 2 punctate calcifications (true lateral slice 24 and spot compression MLO slice 22). Targeted ultrasound is performed, showing an irregular hypoechoic mass in the RIGHT breast at the 12 o'clock axis, 3 cm from the nipple, measuring 5 x 3 x 4 mm, containing punctate echogenic foci that likely represent calcifications, altogether a likely correlate for the mammographic finding. IMPRESSION: Irregular hypoechoic mass in the RIGHT breast at the 12 o'clock axis, 3 cm from the nipple, measuring 5 mm, a  likely correlate for the mammographic finding. This is a suspicious finding for which ultrasound-guided biopsy is recommended. RECOMMENDATION: 1. Ultrasound-guided biopsy for the RIGHT breast mass at the 12 o'clock axis, 3 cm from the nipple, measuring 5 mm, a likely correlate for the mammographic finding. 2. Close attention to the postprocedure mammogram to ensure  mammographic and sonographic correspondence. If findings do not correspond, stereotactic biopsy would be recommended for the mammographic finding. Patient is aware of this possibility. I have discussed the findings and recommendations with the patient and her family. If applicable, a reminder letter will be sent to the patient regarding the next appointment. BI-RADS CATEGORY  4: Suspicious. Electronically Signed   By: Franki Cabot M.D.   On: 02/21/2022 11:47

## 2022-04-25 DIAGNOSIS — R0602 Shortness of breath: Secondary | ICD-10-CM | POA: Diagnosis not present

## 2022-04-25 DIAGNOSIS — E782 Mixed hyperlipidemia: Secondary | ICD-10-CM | POA: Diagnosis not present

## 2022-04-25 DIAGNOSIS — R002 Palpitations: Secondary | ICD-10-CM | POA: Diagnosis not present

## 2022-04-25 DIAGNOSIS — I10 Essential (primary) hypertension: Secondary | ICD-10-CM | POA: Diagnosis not present

## 2022-04-25 DIAGNOSIS — I4719 Other supraventricular tachycardia: Secondary | ICD-10-CM | POA: Diagnosis not present

## 2022-04-28 ENCOUNTER — Other Ambulatory Visit: Payer: PPO

## 2022-04-28 ENCOUNTER — Encounter: Payer: PPO | Admitting: Licensed Clinical Social Worker

## 2022-04-29 DIAGNOSIS — C50011 Malignant neoplasm of nipple and areola, right female breast: Secondary | ICD-10-CM | POA: Diagnosis not present

## 2022-04-29 DIAGNOSIS — Z170 Estrogen receptor positive status [ER+]: Secondary | ICD-10-CM | POA: Diagnosis not present

## 2022-04-29 NOTE — Consult Note (Signed)
NEW PATIENT EVALUATION  Name: Hannah [name]  MRN: [id number]  Date:   04/20/2022     DOB: 1940/06/26   This 82 y.o. female patient presents to the clinic for initial evaluation of stage Ia (cT1a cN0 M0) ER positive PR and HER2 negative right breast invasive mammary carcinoma with lobular features.Marland Kitchen  REFERRING PHYSICIAN: [name], MD  CHIEF COMPLAINT:  Chief Complaint  Patient presents with   Breast Cancer    Consult    DIAGNOSIS: The encounter diagnosis was Malignant neoplasm of nipple of right breast in female, unspecified estrogen receptor status ([place]).   PREVIOUS INVESTIGATIONS:  Mammogram and ultrasound and MRI reviewed Pathology reports reviewed Clinical notes reviewed  HPI: Patient is a 82 year old female presented with abnormal mammogram of the right breast showing an irregular mass at the 12 o'clock position 3 cm from the nipple measuring 5 mm in greatest dimension.  This was confirmed on ultrasound and underwent targeted biopsy showing invasive mammary carcinoma with lobular features.  MRI scan confirmed 3 mm enhancing focus in the upper central right breast consistent with biopsy-proven malignancy no additional suspicious findings in either breast or axillary adenopathy was noted.  She underwent a wide local excision for an overall grade 2 invasive mammary carcinoma with lobular features 4 mm in greatest dimension all margins were clear closest margin 4 mm.  No regional lymph nodes were submitted.  Patient has done well postoperatively.  She is seen today for radiation oncology opinion.  Based on tumor size systemic therapy was not recommended.  Patient will be a candidate for endocrine therapy after completion of radiation.  She is doing well specifically denies breast tenderness cough or bone pain.  PLANNED TREATMENT REGIMEN: Hypofractionated whole right whole breast radiation  PAST MEDICAL HISTORY:  has a past medical history of anesthesia, Asthma, Breast cancer  ([place]) (03/23/2022), Cancer ([place]), Chronic UTI, COVID, DVT (deep venous thrombosis) ([place]), Dysrhythmia, GERD (gastroesophageal reflux disease), Headache, Heart murmur, Hyperlipidemia, Hypertension, Mitral valve prolapse, and PONV (postoperative nausea and vomiting).    PAST SURGICAL HISTORY:  Past Surgical History:  Procedure Laterality Date   ABDOMINAL HYSTERECTOMY     BACK SURGERY  35 years   BOWEL RESECTION  09/09/2014   Procedure: , bladder instilation;  Surgeon: Dia Crawford III, MD;  Location: [facility];  Service: General;;   BREAST BIOPSY Right 03/17/2022   [place] Core Bx, coil clip 12;00, path pending   BREAST BIOPSY Right 03/17/2022   [place] RT BREAST BX W LOC DEV 1ST LESION IMG BX SPEC US GUIDE 03/17/2022 [facility]   BREAST BIOPSY Right 03/31/2022   MM RT RADIO FREQUENCY TAG LOC MAMMO GUIDE 03/31/2022 [facility]   BREAST LUMPECTOMY WITH RADIOFREQUENCY TAG IDENTIFICATION Right 03/31/2022   hologic tag [id number]   CARDIAC CATHETERIZATION Right 09/09/2014   Procedure: CENTRAL LINE INSERTION;  Surgeon: Dia Crawford III, MD;  Location: [facility];  Service: General;  Laterality: Right;   CATARACT EXTRACTION W/ INTRAOCULAR LENS IMPLANT Right    CATARACT EXTRACTION W/PHACO Left 05/11/2016   Procedure: CATARACT EXTRACTION PHACO AND INTRAOCULAR LENS PLACEMENT ([place]);  Surgeon: Estill Cotta, MD;  Location: [facility];  Service: Ophthalmology;  Laterality: Left;  [place] 1:25 AP% 25.4 CDE 36.38 Fluid pack lot # [id number]   JOINT REPLACEMENT Right    knee   LAPAROTOMY N/A 09/09/2014   Procedure: EXPLORATORY LAPAROTOMY, small bowel resection;  Surgeon: Dia Crawford III, MD;  Location: [facility];  Service: General;  Laterality: N/A;   NASAL SINUS SURGERY  FAMILY HISTORY: family history includes Breast cancer (age of onset: 62) in her paternal grandmother; Breast cancer (age of onset: 76) in her paternal aunt; Heart disease in her mother; Hypertension in her mother.  SOCIAL HISTORY:  reports that she  has never smoked. She has never used smokeless tobacco. She reports that she does not drink alcohol and does not use drugs.  ALLERGIES: Codeine, Dilaudid [hydromorphone hcl], Meperidine, and Vicodin [hydrocodone-acetaminophen]  MEDICATIONS:  Current Outpatient Medications  Medication Sig Dispense Refill   alendronate (FOSAMAX) 70 MG tablet Take 70 mg by mouth once a week. Take with a full glass of water on an empty stomach.     amLODipine (NORVASC) 5 MG tablet Take 5 mg by mouth daily.     aspirin 81 MG tablet Take 81 mg by mouth daily.     Aspirin-Acetaminophen-Caffeine (EXCEDRIN PO) Take 1 tablet by mouth as needed (headaches).     metoprolol succinate (TOPROL-XL) 50 MG 24 hr tablet Take 50 mg by mouth 2 (two) times daily. Take with or immediately following a meal.     traMADol (ULTRAM) 50 MG tablet Take 1 tablet (50 mg total) by mouth every 6 (six) hours as needed. (Patient not taking: Reported on 04/20/2022) 20 tablet 0   No current facility-administered medications for this encounter.    ECOG PERFORMANCE STATUS:  0 - Asymptomatic  REVIEW OF SYSTEMS: Patient denies any weight loss, fatigue, weakness, fever, chills or night sweats. Patient denies any loss of vision, blurred vision. Patient denies any ringing  of the ears or hearing loss. No irregular heartbeat. Patient denies heart murmur or history of fainting. Patient denies any chest pain or pain radiating to her upper extremities. Patient denies any shortness of breath, difficulty breathing at night, cough or hemoptysis. Patient denies any swelling in the lower legs. Patient denies any nausea vomiting, vomiting of blood, or coffee ground material in the vomitus. Patient denies any stomach pain. Patient states has had normal bowel movements no significant constipation or diarrhea. Patient denies any dysuria, hematuria or significant nocturia. Patient denies any problems walking, swelling in the joints or loss of balance. Patient denies any  skin changes, loss of hair or loss of weight. Patient denies any excessive worrying or anxiety or significant depression. Patient denies any problems with insomnia. Patient denies excessive thirst, polyuria, polydipsia. Patient denies any swollen glands, patient denies easy bruising or easy bleeding. Patient denies any recent infections, allergies or URI. Patient "s visual fields have not changed significantly in recent time.   PHYSICAL EXAM: BP (!) 144/51   Pulse 60   Temp 97.7 F (36.5 C)   Resp 14   Ht 5' 6.5" (1.689 m)   Wt 114 lb 9.6 oz (52 kg)   BMI 18.22 kg/m  Right breast as well healed lumpectomy scar.  No dominant masses noted in either breast no axillary or supraclavicular adenopathy is appreciated.  Well-developed well-nourished patient in NAD. HEENT reveals PERLA, EOMI, discs not visualized.  Oral cavity is clear. No oral mucosal lesions are identified. Neck is clear without evidence of cervical or supraclavicular adenopathy. Lungs are clear to A&P. Cardiac examination is essentially unremarkable with regular rate and rhythm without murmur rub or thrill. Abdomen is benign with no organomegaly or masses noted. Motor sensory and DTR levels are equal and symmetric in the upper and lower extremities. Cranial nerves II through XII are grossly intact. Proprioception is intact. No peripheral adenopathy or edema is identified. No motor or sensory levels are  noted. Crude visual fields are within normal range.  LABORATORY DATA: Pathology reports reviewed    RADIOLOGY RESULTS: Mammogram ultrasound and MRI scans reviewed compatible with above-stated findings   IMPRESSION: Age 1A ER positive PR and HER2 negative invasive mammary carcinoma of the right breast with lobular features status post wide local excision in an 82 year old female  PLAN: At this time I recommended hypofractionated course of whole breast radiation to her right breast.  Would also boost her scar another 1000 cGy using photon  or electron beam treatment.  Risks and benefits of treatment including skin reaction fatigue alteration of blood counts possible inclusion of superficial lung all were discussed in detail with the patient.  She comprehends her treatment plan well.  I personally set up and ordered CT simulation.  Patient also be candidate for endocrine therapy after completion of radiation.  I would like to take this opportunity to thank you for allowing me to participate in the care of your patient.Noreene Filbert, MD

## 2022-05-03 ENCOUNTER — Ambulatory Visit: Payer: PPO

## 2022-05-05 ENCOUNTER — Ambulatory Visit: Payer: PPO

## 2022-05-09 ENCOUNTER — Encounter: Payer: Self-pay | Admitting: *Deleted

## 2022-05-09 ENCOUNTER — Ambulatory Visit
Admission: RE | Admit: 2022-05-09 | Discharge: 2022-05-09 | Disposition: A | Discharge status: Home / Self Care | Payer: PPO | Source: Ambulatory Visit | Attending: Radiation Oncology | Admitting: Radiation Oncology

## 2022-05-09 DIAGNOSIS — C50011 Malignant neoplasm of nipple and areola, right female breast: Secondary | ICD-10-CM | POA: Diagnosis not present

## 2022-05-09 DIAGNOSIS — Z170 Estrogen receptor positive status [ER+]: Secondary | ICD-10-CM | POA: Diagnosis not present

## 2022-05-12 ENCOUNTER — Other Ambulatory Visit: Payer: Self-pay | Admitting: *Deleted

## 2022-05-12 DIAGNOSIS — Z170 Estrogen receptor positive status [ER+]: Secondary | ICD-10-CM | POA: Diagnosis not present

## 2022-05-12 DIAGNOSIS — C50011 Malignant neoplasm of nipple and areola, right female breast: Secondary | ICD-10-CM | POA: Insufficient documentation

## 2022-05-16 ENCOUNTER — Ambulatory Visit: Admission: RE | Admit: 2022-05-16 | Payer: PPO | Source: Ambulatory Visit

## 2022-05-16 DIAGNOSIS — Z170 Estrogen receptor positive status [ER+]: Secondary | ICD-10-CM | POA: Diagnosis not present

## 2022-05-16 DIAGNOSIS — C50011 Malignant neoplasm of nipple and areola, right female breast: Secondary | ICD-10-CM | POA: Diagnosis not present

## 2022-05-16 DIAGNOSIS — M5414 Radiculopathy, thoracic region: Secondary | ICD-10-CM | POA: Diagnosis not present

## 2022-05-16 DIAGNOSIS — Z510 Encounter for antineoplastic radiation therapy: Secondary | ICD-10-CM | POA: Diagnosis not present

## 2022-05-17 ENCOUNTER — Ambulatory Visit
Admission: RE | Admit: 2022-05-17 | Discharge: 2022-05-17 | Disposition: A | Discharge status: Home / Self Care | Payer: PPO | Source: Ambulatory Visit | Attending: Radiation Oncology | Admitting: Radiation Oncology

## 2022-05-17 ENCOUNTER — Other Ambulatory Visit: Payer: Self-pay

## 2022-05-17 DIAGNOSIS — Z170 Estrogen receptor positive status [ER+]: Secondary | ICD-10-CM | POA: Diagnosis not present

## 2022-05-17 DIAGNOSIS — Z510 Encounter for antineoplastic radiation therapy: Secondary | ICD-10-CM | POA: Diagnosis not present

## 2022-05-17 DIAGNOSIS — C50011 Malignant neoplasm of nipple and areola, right female breast: Secondary | ICD-10-CM | POA: Diagnosis not present

## 2022-05-17 LAB — RAD ONC ARIA SESSION SUMMARY
Course Elapsed Days: 0
Plan Fractions Treated to Date: 1
Plan Prescribed Dose Per Fraction: 2.66 Gy
Plan Total Fractions Prescribed: 16
Plan Total Prescribed Dose: 42.56 Gy
Reference Point Dosage Given to Date: 2.66 Gy
Reference Point Session Dosage Given: 2.66 Gy
Session Number: 1

## 2022-05-18 ENCOUNTER — Other Ambulatory Visit: Payer: Self-pay

## 2022-05-18 ENCOUNTER — Ambulatory Visit
Admission: RE | Admit: 2022-05-18 | Discharge: 2022-05-18 | Disposition: A | Discharge status: Home / Self Care | Payer: PPO | Source: Ambulatory Visit | Attending: Radiation Oncology | Admitting: Radiation Oncology

## 2022-05-18 DIAGNOSIS — Z510 Encounter for antineoplastic radiation therapy: Secondary | ICD-10-CM | POA: Diagnosis not present

## 2022-05-18 DIAGNOSIS — C50011 Malignant neoplasm of nipple and areola, right female breast: Secondary | ICD-10-CM | POA: Diagnosis not present

## 2022-05-18 DIAGNOSIS — Z170 Estrogen receptor positive status [ER+]: Secondary | ICD-10-CM | POA: Diagnosis not present

## 2022-05-18 LAB — RAD ONC ARIA SESSION SUMMARY
Course Elapsed Days: 1
Plan Fractions Treated to Date: 2
Plan Prescribed Dose Per Fraction: 2.66 Gy
Plan Total Fractions Prescribed: 16
Plan Total Prescribed Dose: 42.56 Gy
Reference Point Dosage Given to Date: 5.32 Gy
Reference Point Session Dosage Given: 2.66 Gy
Session Number: 2

## 2022-05-19 ENCOUNTER — Other Ambulatory Visit: Payer: Self-pay

## 2022-05-19 ENCOUNTER — Ambulatory Visit
Admission: RE | Admit: 2022-05-19 | Discharge: 2022-05-19 | Disposition: A | Discharge status: Home / Self Care | Payer: PPO | Source: Ambulatory Visit | Attending: Radiation Oncology | Admitting: Radiation Oncology

## 2022-05-19 DIAGNOSIS — C50011 Malignant neoplasm of nipple and areola, right female breast: Secondary | ICD-10-CM | POA: Diagnosis not present

## 2022-05-19 DIAGNOSIS — Z170 Estrogen receptor positive status [ER+]: Secondary | ICD-10-CM | POA: Diagnosis not present

## 2022-05-19 DIAGNOSIS — Z510 Encounter for antineoplastic radiation therapy: Secondary | ICD-10-CM | POA: Diagnosis not present

## 2022-05-19 LAB — RAD ONC ARIA SESSION SUMMARY
Course Elapsed Days: 2
Plan Fractions Treated to Date: 3
Plan Prescribed Dose Per Fraction: 2.66 Gy
Plan Total Fractions Prescribed: 16
Plan Total Prescribed Dose: 42.56 Gy
Reference Point Dosage Given to Date: 7.98 Gy
Reference Point Session Dosage Given: 2.66 Gy
Session Number: 3

## 2022-05-20 ENCOUNTER — Other Ambulatory Visit: Payer: Self-pay

## 2022-05-20 ENCOUNTER — Ambulatory Visit
Admission: RE | Admit: 2022-05-20 | Discharge: 2022-05-20 | Disposition: A | Discharge status: Home / Self Care | Payer: PPO | Source: Ambulatory Visit | Attending: Radiation Oncology | Admitting: Radiation Oncology

## 2022-05-20 DIAGNOSIS — C50011 Malignant neoplasm of nipple and areola, right female breast: Secondary | ICD-10-CM | POA: Diagnosis not present

## 2022-05-20 DIAGNOSIS — Z170 Estrogen receptor positive status [ER+]: Secondary | ICD-10-CM | POA: Diagnosis not present

## 2022-05-20 DIAGNOSIS — Z510 Encounter for antineoplastic radiation therapy: Secondary | ICD-10-CM | POA: Diagnosis not present

## 2022-05-20 LAB — RAD ONC ARIA SESSION SUMMARY
Course Elapsed Days: 3
Plan Fractions Treated to Date: 4
Plan Prescribed Dose Per Fraction: 2.66 Gy
Plan Total Fractions Prescribed: 16
Plan Total Prescribed Dose: 42.56 Gy
Reference Point Dosage Given to Date: 10.64 Gy
Reference Point Session Dosage Given: 2.66 Gy
Session Number: 4

## 2022-05-23 ENCOUNTER — Ambulatory Visit
Admission: RE | Admit: 2022-05-23 | Discharge: 2022-05-23 | Disposition: A | Discharge status: Home / Self Care | Payer: PPO | Source: Ambulatory Visit | Attending: Radiation Oncology | Admitting: Radiation Oncology

## 2022-05-23 ENCOUNTER — Other Ambulatory Visit: Payer: Self-pay

## 2022-05-23 DIAGNOSIS — Z510 Encounter for antineoplastic radiation therapy: Secondary | ICD-10-CM | POA: Diagnosis not present

## 2022-05-23 DIAGNOSIS — C50011 Malignant neoplasm of nipple and areola, right female breast: Secondary | ICD-10-CM | POA: Diagnosis not present

## 2022-05-23 DIAGNOSIS — Z170 Estrogen receptor positive status [ER+]: Secondary | ICD-10-CM | POA: Diagnosis not present

## 2022-05-23 LAB — RAD ONC ARIA SESSION SUMMARY
Course Elapsed Days: 6
Plan Fractions Treated to Date: 5
Plan Prescribed Dose Per Fraction: 2.66 Gy
Plan Total Fractions Prescribed: 16
Plan Total Prescribed Dose: 42.56 Gy
Reference Point Dosage Given to Date: 13.3 Gy
Reference Point Session Dosage Given: 2.66 Gy
Session Number: 5

## 2022-05-24 ENCOUNTER — Ambulatory Visit
Admission: RE | Admit: 2022-05-24 | Discharge: 2022-05-24 | Disposition: A | Discharge status: Home / Self Care | Payer: PPO | Source: Ambulatory Visit | Attending: Radiation Oncology | Admitting: Radiation Oncology

## 2022-05-24 ENCOUNTER — Other Ambulatory Visit: Payer: Self-pay

## 2022-05-24 DIAGNOSIS — C50011 Malignant neoplasm of nipple and areola, right female breast: Secondary | ICD-10-CM | POA: Diagnosis not present

## 2022-05-24 DIAGNOSIS — Z170 Estrogen receptor positive status [ER+]: Secondary | ICD-10-CM | POA: Diagnosis not present

## 2022-05-24 DIAGNOSIS — Z510 Encounter for antineoplastic radiation therapy: Secondary | ICD-10-CM | POA: Diagnosis not present

## 2022-05-24 LAB — RAD ONC ARIA SESSION SUMMARY
Course Elapsed Days: 7
Plan Fractions Treated to Date: 6
Plan Prescribed Dose Per Fraction: 2.66 Gy
Plan Total Fractions Prescribed: 16
Plan Total Prescribed Dose: 42.56 Gy
Reference Point Dosage Given to Date: 15.96 Gy
Reference Point Session Dosage Given: 2.66 Gy
Session Number: 6

## 2022-05-25 ENCOUNTER — Ambulatory Visit
Admission: RE | Admit: 2022-05-25 | Discharge: 2022-05-25 | Disposition: A | Discharge status: Home / Self Care | Payer: PPO | Source: Ambulatory Visit | Attending: Radiation Oncology | Admitting: Radiation Oncology

## 2022-05-25 ENCOUNTER — Other Ambulatory Visit: Payer: Self-pay

## 2022-05-25 DIAGNOSIS — Z170 Estrogen receptor positive status [ER+]: Secondary | ICD-10-CM | POA: Diagnosis not present

## 2022-05-25 DIAGNOSIS — Z510 Encounter for antineoplastic radiation therapy: Secondary | ICD-10-CM | POA: Diagnosis not present

## 2022-05-25 DIAGNOSIS — C50011 Malignant neoplasm of nipple and areola, right female breast: Secondary | ICD-10-CM | POA: Diagnosis not present

## 2022-05-25 LAB — RAD ONC ARIA SESSION SUMMARY
Course Elapsed Days: 8
Plan Fractions Treated to Date: 7
Plan Prescribed Dose Per Fraction: 2.66 Gy
Plan Total Fractions Prescribed: 16
Plan Total Prescribed Dose: 42.56 Gy
Reference Point Dosage Given to Date: 18.62 Gy
Reference Point Session Dosage Given: 2.66 Gy
Session Number: 7

## 2022-05-26 ENCOUNTER — Ambulatory Visit
Admission: RE | Admit: 2022-05-26 | Discharge: 2022-05-26 | Disposition: A | Discharge status: Home / Self Care | Payer: PPO | Source: Ambulatory Visit | Attending: Radiation Oncology | Admitting: Radiation Oncology

## 2022-05-26 ENCOUNTER — Inpatient Hospital Stay: Payer: PPO

## 2022-05-26 ENCOUNTER — Other Ambulatory Visit: Payer: Self-pay

## 2022-05-26 DIAGNOSIS — Z803 Family history of malignant neoplasm of breast: Secondary | ICD-10-CM | POA: Insufficient documentation

## 2022-05-26 DIAGNOSIS — Z90710 Acquired absence of both cervix and uterus: Secondary | ICD-10-CM | POA: Insufficient documentation

## 2022-05-26 DIAGNOSIS — Z170 Estrogen receptor positive status [ER+]: Secondary | ICD-10-CM | POA: Insufficient documentation

## 2022-05-26 DIAGNOSIS — C50011 Malignant neoplasm of nipple and areola, right female breast: Secondary | ICD-10-CM | POA: Diagnosis not present

## 2022-05-26 DIAGNOSIS — C50811 Malignant neoplasm of overlapping sites of right female breast: Secondary | ICD-10-CM | POA: Insufficient documentation

## 2022-05-26 DIAGNOSIS — Z510 Encounter for antineoplastic radiation therapy: Secondary | ICD-10-CM | POA: Diagnosis not present

## 2022-05-26 LAB — RAD ONC ARIA SESSION SUMMARY
Course Elapsed Days: 9
Plan Fractions Treated to Date: 8
Plan Prescribed Dose Per Fraction: 2.66 Gy
Plan Total Fractions Prescribed: 16
Plan Total Prescribed Dose: 42.56 Gy
Reference Point Dosage Given to Date: 21.28 Gy
Reference Point Session Dosage Given: 2.66 Gy
Session Number: 8

## 2022-05-27 ENCOUNTER — Other Ambulatory Visit: Payer: Self-pay

## 2022-05-27 ENCOUNTER — Ambulatory Visit
Admission: RE | Admit: 2022-05-27 | Discharge: 2022-05-27 | Disposition: A | Discharge status: Home / Self Care | Payer: PPO | Source: Ambulatory Visit | Attending: Radiation Oncology | Admitting: Radiation Oncology

## 2022-05-27 DIAGNOSIS — Z170 Estrogen receptor positive status [ER+]: Secondary | ICD-10-CM | POA: Diagnosis not present

## 2022-05-27 DIAGNOSIS — Z510 Encounter for antineoplastic radiation therapy: Secondary | ICD-10-CM | POA: Diagnosis not present

## 2022-05-27 DIAGNOSIS — C50011 Malignant neoplasm of nipple and areola, right female breast: Secondary | ICD-10-CM | POA: Diagnosis not present

## 2022-05-27 LAB — RAD ONC ARIA SESSION SUMMARY
Course Elapsed Days: 10
Plan Fractions Treated to Date: 9
Plan Prescribed Dose Per Fraction: 2.66 Gy
Plan Total Fractions Prescribed: 16
Plan Total Prescribed Dose: 42.56 Gy
Reference Point Dosage Given to Date: 23.94 Gy
Reference Point Session Dosage Given: 2.66 Gy
Session Number: 9

## 2022-05-30 ENCOUNTER — Other Ambulatory Visit: Payer: Self-pay

## 2022-05-30 ENCOUNTER — Ambulatory Visit
Admission: RE | Admit: 2022-05-30 | Discharge: 2022-05-30 | Disposition: A | Discharge status: Home / Self Care | Payer: PPO | Source: Ambulatory Visit | Attending: Radiation Oncology | Admitting: Radiation Oncology

## 2022-05-30 DIAGNOSIS — Z510 Encounter for antineoplastic radiation therapy: Secondary | ICD-10-CM | POA: Diagnosis not present

## 2022-05-30 DIAGNOSIS — C50011 Malignant neoplasm of nipple and areola, right female breast: Secondary | ICD-10-CM | POA: Diagnosis not present

## 2022-05-30 DIAGNOSIS — Z170 Estrogen receptor positive status [ER+]: Secondary | ICD-10-CM | POA: Diagnosis not present

## 2022-05-30 LAB — RAD ONC ARIA SESSION SUMMARY
Course Elapsed Days: 13
Plan Fractions Treated to Date: 10
Plan Prescribed Dose Per Fraction: 2.66 Gy
Plan Total Fractions Prescribed: 16
Plan Total Prescribed Dose: 42.56 Gy
Reference Point Dosage Given to Date: 26.6 Gy
Reference Point Session Dosage Given: 2.66 Gy
Session Number: 10

## 2022-05-31 ENCOUNTER — Ambulatory Visit: Payer: PPO

## 2022-06-01 ENCOUNTER — Ambulatory Visit
Admission: RE | Admit: 2022-06-01 | Discharge: 2022-06-01 | Disposition: A | Discharge status: Home / Self Care | Payer: PPO | Source: Ambulatory Visit | Attending: Radiation Oncology | Admitting: Radiation Oncology

## 2022-06-01 ENCOUNTER — Other Ambulatory Visit: Payer: Self-pay

## 2022-06-01 DIAGNOSIS — C50011 Malignant neoplasm of nipple and areola, right female breast: Secondary | ICD-10-CM | POA: Diagnosis not present

## 2022-06-01 DIAGNOSIS — Z170 Estrogen receptor positive status [ER+]: Secondary | ICD-10-CM | POA: Diagnosis not present

## 2022-06-01 DIAGNOSIS — Z510 Encounter for antineoplastic radiation therapy: Secondary | ICD-10-CM | POA: Diagnosis not present

## 2022-06-01 LAB — RAD ONC ARIA SESSION SUMMARY
Course Elapsed Days: 15
Plan Fractions Treated to Date: 11
Plan Prescribed Dose Per Fraction: 2.66 Gy
Plan Total Fractions Prescribed: 16
Plan Total Prescribed Dose: 42.56 Gy
Reference Point Dosage Given to Date: 29.26 Gy
Reference Point Session Dosage Given: 2.66 Gy
Session Number: 11

## 2022-06-02 ENCOUNTER — Ambulatory Visit
Admission: RE | Admit: 2022-06-02 | Discharge: 2022-06-02 | Disposition: A | Discharge status: Home / Self Care | Payer: PPO | Source: Ambulatory Visit | Attending: Radiation Oncology | Admitting: Radiation Oncology

## 2022-06-02 ENCOUNTER — Other Ambulatory Visit: Payer: Self-pay

## 2022-06-02 DIAGNOSIS — Z510 Encounter for antineoplastic radiation therapy: Secondary | ICD-10-CM | POA: Diagnosis not present

## 2022-06-02 DIAGNOSIS — C50011 Malignant neoplasm of nipple and areola, right female breast: Secondary | ICD-10-CM | POA: Diagnosis not present

## 2022-06-02 DIAGNOSIS — Z170 Estrogen receptor positive status [ER+]: Secondary | ICD-10-CM | POA: Diagnosis not present

## 2022-06-02 LAB — RAD ONC ARIA SESSION SUMMARY
Course Elapsed Days: 16
Plan Fractions Treated to Date: 12
Plan Prescribed Dose Per Fraction: 2.66 Gy
Plan Total Fractions Prescribed: 16
Plan Total Prescribed Dose: 42.56 Gy
Reference Point Dosage Given to Date: 31.92 Gy
Reference Point Session Dosage Given: 2.66 Gy
Session Number: 12

## 2022-06-03 ENCOUNTER — Ambulatory Visit
Admission: RE | Admit: 2022-06-03 | Discharge: 2022-06-03 | Disposition: A | Discharge status: Home / Self Care | Payer: PPO | Source: Ambulatory Visit | Attending: Radiation Oncology | Admitting: Radiation Oncology

## 2022-06-03 ENCOUNTER — Other Ambulatory Visit: Payer: Self-pay

## 2022-06-03 DIAGNOSIS — Z170 Estrogen receptor positive status [ER+]: Secondary | ICD-10-CM | POA: Diagnosis not present

## 2022-06-03 DIAGNOSIS — Z510 Encounter for antineoplastic radiation therapy: Secondary | ICD-10-CM | POA: Diagnosis not present

## 2022-06-03 DIAGNOSIS — C50011 Malignant neoplasm of nipple and areola, right female breast: Secondary | ICD-10-CM | POA: Diagnosis not present

## 2022-06-03 LAB — RAD ONC ARIA SESSION SUMMARY
Course Elapsed Days: 17
Plan Fractions Treated to Date: 13
Plan Prescribed Dose Per Fraction: 2.66 Gy
Plan Total Fractions Prescribed: 16
Plan Total Prescribed Dose: 42.56 Gy
Reference Point Dosage Given to Date: 34.58 Gy
Reference Point Session Dosage Given: 2.66 Gy
Session Number: 13

## 2022-06-06 ENCOUNTER — Ambulatory Visit: Payer: PPO

## 2022-06-06 ENCOUNTER — Other Ambulatory Visit: Payer: Self-pay

## 2022-06-06 ENCOUNTER — Ambulatory Visit
Admission: RE | Admit: 2022-06-06 | Discharge: 2022-06-06 | Disposition: A | Discharge status: Home / Self Care | Payer: PPO | Source: Ambulatory Visit | Attending: Radiation Oncology | Admitting: Radiation Oncology

## 2022-06-06 DIAGNOSIS — Z510 Encounter for antineoplastic radiation therapy: Secondary | ICD-10-CM | POA: Diagnosis not present

## 2022-06-06 DIAGNOSIS — C50011 Malignant neoplasm of nipple and areola, right female breast: Secondary | ICD-10-CM | POA: Diagnosis not present

## 2022-06-06 DIAGNOSIS — Z170 Estrogen receptor positive status [ER+]: Secondary | ICD-10-CM | POA: Diagnosis not present

## 2022-06-06 LAB — RAD ONC ARIA SESSION SUMMARY
Course Elapsed Days: 20
Plan Fractions Treated to Date: 14
Plan Prescribed Dose Per Fraction: 2.66 Gy
Plan Total Fractions Prescribed: 16
Plan Total Prescribed Dose: 42.56 Gy
Reference Point Dosage Given to Date: 37.24 Gy
Reference Point Session Dosage Given: 2.66 Gy
Session Number: 14

## 2022-06-07 ENCOUNTER — Ambulatory Visit: Payer: PPO

## 2022-06-07 ENCOUNTER — Encounter: Payer: Self-pay | Admitting: *Deleted

## 2022-06-07 ENCOUNTER — Encounter: Payer: Self-pay | Admitting: Obstetrics and Gynecology

## 2022-06-07 ENCOUNTER — Ambulatory Visit (INDEPENDENT_AMBULATORY_CARE_PROVIDER_SITE_OTHER): Payer: PPO | Admitting: Obstetrics and Gynecology

## 2022-06-07 ENCOUNTER — Ambulatory Visit
Admission: RE | Admit: 2022-06-07 | Discharge: 2022-06-07 | Disposition: A | Discharge status: Home / Self Care | Payer: PPO | Source: Ambulatory Visit | Attending: Radiation Oncology | Admitting: Radiation Oncology

## 2022-06-07 ENCOUNTER — Other Ambulatory Visit: Payer: Self-pay

## 2022-06-07 VITALS — BP 102/70 | Temp 97.3°F | Ht 65.0 in | Wt 114.0 lb

## 2022-06-07 DIAGNOSIS — R399 Unspecified symptoms and signs involving the genitourinary system: Secondary | ICD-10-CM | POA: Diagnosis not present

## 2022-06-07 DIAGNOSIS — N3010 Interstitial cystitis (chronic) without hematuria: Secondary | ICD-10-CM | POA: Diagnosis not present

## 2022-06-07 DIAGNOSIS — C50011 Malignant neoplasm of nipple and areola, right female breast: Secondary | ICD-10-CM | POA: Diagnosis not present

## 2022-06-07 DIAGNOSIS — Z510 Encounter for antineoplastic radiation therapy: Secondary | ICD-10-CM | POA: Diagnosis not present

## 2022-06-07 DIAGNOSIS — Z170 Estrogen receptor positive status [ER+]: Secondary | ICD-10-CM | POA: Diagnosis not present

## 2022-06-07 LAB — RAD ONC ARIA SESSION SUMMARY
Course Elapsed Days: 21
Plan Fractions Treated to Date: 15
Plan Prescribed Dose Per Fraction: 2.66 Gy
Plan Total Fractions Prescribed: 16
Plan Total Prescribed Dose: 42.56 Gy
Reference Point Dosage Given to Date: 39.9 Gy
Reference Point Session Dosage Given: 2.66 Gy
Session Number: 15

## 2022-06-07 LAB — POCT URINALYSIS DIPSTICK
Bilirubin, UA: NEGATIVE
Blood, UA: NEGATIVE
Glucose, UA: NEGATIVE
Ketones, UA: NEGATIVE
Leukocytes, UA: NEGATIVE
Nitrite, UA: NEGATIVE
Protein, UA: NEGATIVE
Spec Grav, UA: 1.015 (ref 1.010–1.025)
pH, UA: 6 (ref 5.0–8.0)

## 2022-06-07 NOTE — Progress Notes (Signed)
Daughter [name] called to clarify some appointments.

## 2022-06-07 NOTE — Progress Notes (Signed)
[name], MD   Chief Complaint  Patient presents with   Urinary Tract Infection    Frequency and burning urinating x few days    HPI:      Hannah [name] is a 82 y.o. [phone] whose LMP was No LMP recorded. Patient has had a hysterectomy., presents today for urinary frequency, dysuria, LBP, pelvic pain for over a week, but sx worse recently. Hx of IC and UTIs, but hard for pt to differentiate sx. Hx of neg C&S in our office since last yr.  Saw pt 4/23 and referred to [facility] urology (appt canceled by them and never rescheduled). Pt drinks 2-3 cups coffee daily, doesn't drink enough water daily. Also has had nausea and low grade temp (101) in middle of night. Pt undergoing radiation for new dx RT breast cancer (has done ~8/21 tx), s/p lumpectomy 12/23.     Patient Active Problem List   Diagnosis Date Noted   Breast cancer ([place]) 03/23/2022   Goals of care, counseling/discussion 03/23/2022   Family history of cancer 03/23/2022   Migraine 06/15/2017   Pelvic pain in female 06/15/2017   Personal history of ovarian cyst 06/15/2017   Reactive airway disease 12/28/2016   Low serum vitamin D 07/04/2016   Medicare annual wellness visit, initial 07/04/2016   'light-for-dates' infant with signs of fetal malnutrition 12/30/2015   Hyperlipidemia, mixed 12/22/2015   Perforated bowel ([place]) 10/06/2014   Abdominal pain, acute, left lower quadrant 08/05/2014   Hematochezia 08/05/2014   Atrial tachycardia 03/27/2014   Benign essential hypertension 08/30/2013   Ovarian cyst, bilateral 12/17/2012   Right lower quadrant pain 12/17/2012   Chronic cystitis 05/15/2012   Incomplete emptying of bladder 05/15/2012   Microscopic hematuria 05/15/2012   Mixed urge and stress incontinence 05/15/2012   Symptoms involving urinary system 05/15/2012    Past Surgical History:  Procedure Laterality Date   ABDOMINAL HYSTERECTOMY     BACK SURGERY  35 years   BOWEL RESECTION  09/09/2014    Procedure: , bladder instilation;  Surgeon: Dia Crawford III, MD;  Location: [facility];  Service: General;;   BREAST BIOPSY Right 03/17/2022   [place] Core Bx, coil clip 12;00, path pending   BREAST BIOPSY Right 03/17/2022   [place] RT BREAST BX W LOC DEV 1ST LESION IMG BX SPEC US GUIDE 03/17/2022 [facility]   BREAST BIOPSY Right 03/31/2022   MM RT RADIO FREQUENCY TAG LOC MAMMO GUIDE 03/31/2022 [facility]   BREAST LUMPECTOMY WITH RADIOFREQUENCY TAG IDENTIFICATION Right 03/31/2022   hologic tag [id number]   CARDIAC CATHETERIZATION Right 09/09/2014   Procedure: CENTRAL LINE INSERTION;  Surgeon: Dia Crawford III, MD;  Location: [facility];  Service: General;  Laterality: Right;   CATARACT EXTRACTION W/ INTRAOCULAR LENS IMPLANT Right    CATARACT EXTRACTION W/PHACO Left 05/11/2016   Procedure: CATARACT EXTRACTION PHACO AND INTRAOCULAR LENS PLACEMENT ([place]);  Surgeon: Estill Cotta, MD;  Location: [facility];  Service: Ophthalmology;  Laterality: Left;  [place] 1:25 AP% 25.4 CDE 36.38 Fluid pack lot # [id number]   JOINT REPLACEMENT Right    knee   LAPAROTOMY N/A 09/09/2014   Procedure: EXPLORATORY LAPAROTOMY, small bowel resection;  Surgeon: Dia Crawford III, MD;  Location: [facility];  Service: General;  Laterality: N/A;   NASAL SINUS SURGERY      Family History  Problem Relation Age of Onset   Heart disease Mother    Hypertension Mother    Breast cancer Paternal Aunt 95  2 aunts   Breast cancer Paternal Grandmother 73    Social History   Socioeconomic History   Marital status: Married    Spouse name: Not on file   Number of children: Not on file   Years of education: Not on file   Highest education level: Not on file  Occupational History   Not on file  Tobacco Use   Smoking status: Never   Smokeless tobacco: Never  Vaping Use   Vaping Use: Never used  Substance and Sexual Activity   Alcohol use: No   Drug use: No   Sexual activity: Not Currently    Birth control/protection: Surgical     Comment: Hysterectomy  Other Topics Concern   Not on file  Social History Narrative   Not on file   Social Determinants of Health   Financial Resource Strain: Not on file  Food Insecurity: Not on file  Transportation Needs: Not on file  Physical Activity: Not on file  Stress: Not on file  Social Connections: Not on file  Intimate Partner Violence: Not on file    Outpatient Medications Prior to Visit  Medication Sig Dispense Refill   amLODipine (NORVASC) 5 MG tablet Take 5 mg by mouth daily.     aspirin 81 MG tablet Take 81 mg by mouth daily.     Aspirin-Acetaminophen-Caffeine (EXCEDRIN PO) Take 1 tablet by mouth as needed (headaches).     metoprolol succinate (TOPROL-XL) 50 MG 24 hr tablet Take 50 mg by mouth 2 (two) times daily. Take with or immediately following a meal.     alendronate (FOSAMAX) 70 MG tablet Take 70 mg by mouth once a week. Take with a full glass of water on an empty stomach.     traMADol (ULTRAM) 50 MG tablet Take 1 tablet (50 mg total) by mouth every 6 (six) hours as needed. (Patient not taking: Reported on 04/20/2022) 20 tablet 0   No facility-administered medications prior to visit.      ROS:  Review of Systems  Constitutional:  Positive for fever.  Gastrointestinal:  Positive for nausea. Negative for blood in stool, constipation, diarrhea and vomiting.  Genitourinary:  Positive for dysuria and frequency. Negative for dyspareunia, flank pain, hematuria, urgency, vaginal bleeding, vaginal discharge and vaginal pain.  Musculoskeletal:  Negative for back pain.  Skin:  Negative for rash.   BREAST: No symptoms   OBJECTIVE:   Vitals:  BP 102/70   Temp (!) 97.3 F (36.3 C)   Ht '5\' 5"'$  (1.651 m)   Wt 114 lb (51.7 kg)   BMI 18.97 kg/m   Physical Exam Vitals reviewed.  Constitutional:      Appearance: She is well-developed. She is not ill-appearing or toxic-appearing.  Pulmonary:     Effort: Pulmonary effort is normal.  Abdominal:      Tenderness: There is no right CVA tenderness or left CVA tenderness.  Musculoskeletal:        General: Normal range of motion.     Cervical back: Normal range of motion.  Neurological:     General: No focal deficit present.     Mental Status: She is alert and oriented to person, place, and time.     Cranial Nerves: No cranial nerve deficit.  Psychiatric:        Behavior: Behavior normal.        Thought Content: Thought content normal.        Judgment: Judgment normal.     Results: Results for orders placed  or performed in visit on 06/07/22 (from the past 24 hour(s))  POCT Urinalysis Dipstick     Status: Normal   Collection Time: 06/07/22 11:32 AM  Result Value Ref Range   Color, UA yellow    Clarity, UA clear    Glucose, UA Negative Negative   Bilirubin, UA neg    Ketones, UA neg    Spec Grav, UA 1.015 1.010 - 1.025   Blood, UA neg    pH, UA 6.0 5.0 - 8.0   Protein, UA Negative Negative   Urobilinogen, UA     Nitrite, UA neg    Leukocytes, UA Negative Negative   Appearance     Odor       Assessment/Plan: UTI symptoms - Plan: POCT Urinalysis Dipstick, Urine Culture, Ambulatory referral to Urology; recurrent sx, neg C&S. Check C&S today, will f/u if pos. Refer to urology for further eval/mgmt. D/C caffeine, increase water to 64 oz daily.   IC (interstitial cystitis) - Plan: Ambulatory referral to Urology  Pt with nausea/low grade temp in night. Instructed pt to mention this at radiation appt today. Wonder if side effect.     Return if symptoms worsen or fail to improve.  Jacqueline Spofford B. Afra Tricarico, PA-C 06/07/2022 11:34 AM

## 2022-06-08 ENCOUNTER — Ambulatory Visit
Admission: RE | Admit: 2022-06-08 | Discharge: 2022-06-08 | Disposition: A | Discharge status: Home / Self Care | Payer: PPO | Source: Ambulatory Visit | Attending: Radiation Oncology | Admitting: Radiation Oncology

## 2022-06-08 ENCOUNTER — Ambulatory Visit: Admission: RE | Admit: 2022-06-08 | Payer: PPO | Source: Ambulatory Visit

## 2022-06-08 ENCOUNTER — Other Ambulatory Visit: Payer: Self-pay

## 2022-06-08 DIAGNOSIS — C50011 Malignant neoplasm of nipple and areola, right female breast: Secondary | ICD-10-CM | POA: Diagnosis not present

## 2022-06-08 DIAGNOSIS — Z510 Encounter for antineoplastic radiation therapy: Secondary | ICD-10-CM | POA: Diagnosis not present

## 2022-06-08 DIAGNOSIS — Z170 Estrogen receptor positive status [ER+]: Secondary | ICD-10-CM | POA: Diagnosis not present

## 2022-06-08 LAB — RAD ONC ARIA SESSION SUMMARY
Course Elapsed Days: 22
Plan Fractions Treated to Date: 16
Plan Prescribed Dose Per Fraction: 2.66 Gy
Plan Total Fractions Prescribed: 16
Plan Total Prescribed Dose: 42.56 Gy
Reference Point Dosage Given to Date: 42.56 Gy
Reference Point Session Dosage Given: 2.66 Gy
Session Number: 16

## 2022-06-09 ENCOUNTER — Other Ambulatory Visit: Payer: Self-pay

## 2022-06-09 ENCOUNTER — Other Ambulatory Visit: Payer: PPO

## 2022-06-09 ENCOUNTER — Inpatient Hospital Stay: Payer: PPO

## 2022-06-09 ENCOUNTER — Ambulatory Visit
Admission: RE | Admit: 2022-06-09 | Discharge: 2022-06-09 | Disposition: A | Discharge status: Home / Self Care | Payer: PPO | Source: Ambulatory Visit | Attending: Radiation Oncology | Admitting: Radiation Oncology

## 2022-06-09 DIAGNOSIS — C50011 Malignant neoplasm of nipple and areola, right female breast: Secondary | ICD-10-CM

## 2022-06-09 LAB — RAD ONC ARIA SESSION SUMMARY
Course Elapsed Days: 23
Plan Fractions Treated to Date: 1
Plan Prescribed Dose Per Fraction: 2 Gy
Plan Total Fractions Prescribed: 5
Plan Total Prescribed Dose: 10 Gy
Reference Point Dosage Given to Date: 2 Gy
Reference Point Session Dosage Given: 2 Gy
Session Number: 17

## 2022-06-09 LAB — URINE CULTURE

## 2022-06-09 LAB — CBC
HCT: 40 % (ref 36.0–46.0)
Hemoglobin: 12.8 g/dL (ref 12.0–15.0)
MCH: 30.6 pg (ref 26.0–34.0)
MCHC: 32 g/dL (ref 30.0–36.0)
MCV: 95.7 fL (ref 80.0–100.0)
Platelets: 274 10*3/uL (ref 150–400)
RBC: 4.18 MIL/uL (ref 3.87–5.11)
RDW: 13.9 % (ref 11.5–15.5)
WBC: 10.2 10*3/uL (ref 4.0–10.5)
nRBC: 0 % (ref 0.0–0.2)

## 2022-06-10 ENCOUNTER — Ambulatory Visit: Payer: PPO

## 2022-06-13 ENCOUNTER — Other Ambulatory Visit: Payer: Self-pay

## 2022-06-13 ENCOUNTER — Ambulatory Visit
Admission: RE | Admit: 2022-06-13 | Discharge: 2022-06-13 | Disposition: A | Discharge status: Home / Self Care | Payer: PPO | Source: Ambulatory Visit | Attending: Radiation Oncology | Admitting: Radiation Oncology

## 2022-06-13 DIAGNOSIS — C50011 Malignant neoplasm of nipple and areola, right female breast: Secondary | ICD-10-CM | POA: Insufficient documentation

## 2022-06-13 DIAGNOSIS — Z170 Estrogen receptor positive status [ER+]: Secondary | ICD-10-CM | POA: Diagnosis not present

## 2022-06-13 LAB — RAD ONC ARIA SESSION SUMMARY
Course Elapsed Days: 27
Plan Fractions Treated to Date: 2
Plan Prescribed Dose Per Fraction: 2 Gy
Plan Total Fractions Prescribed: 5
Plan Total Prescribed Dose: 10 Gy
Reference Point Dosage Given to Date: 4 Gy
Reference Point Session Dosage Given: 2 Gy
Session Number: 18

## 2022-06-14 ENCOUNTER — Ambulatory Visit: Payer: PPO

## 2022-06-14 ENCOUNTER — Other Ambulatory Visit: Payer: Self-pay

## 2022-06-14 ENCOUNTER — Ambulatory Visit
Admission: RE | Admit: 2022-06-14 | Discharge: 2022-06-14 | Disposition: A | Discharge status: Home / Self Care | Payer: PPO | Source: Ambulatory Visit | Attending: Radiation Oncology | Admitting: Radiation Oncology

## 2022-06-14 DIAGNOSIS — C50011 Malignant neoplasm of nipple and areola, right female breast: Secondary | ICD-10-CM | POA: Diagnosis not present

## 2022-06-14 LAB — RAD ONC ARIA SESSION SUMMARY
Course Elapsed Days: 28
Plan Fractions Treated to Date: 3
Plan Prescribed Dose Per Fraction: 2 Gy
Plan Total Fractions Prescribed: 5
Plan Total Prescribed Dose: 10 Gy
Reference Point Dosage Given to Date: 6 Gy
Reference Point Session Dosage Given: 2 Gy
Session Number: 19

## 2022-06-15 ENCOUNTER — Ambulatory Visit
Admission: RE | Admit: 2022-06-15 | Discharge: 2022-06-15 | Disposition: A | Discharge status: Home / Self Care | Payer: PPO | Source: Ambulatory Visit | Attending: Radiation Oncology | Admitting: Radiation Oncology

## 2022-06-15 ENCOUNTER — Other Ambulatory Visit: Payer: Self-pay

## 2022-06-15 ENCOUNTER — Ambulatory Visit: Payer: PPO

## 2022-06-15 DIAGNOSIS — C50011 Malignant neoplasm of nipple and areola, right female breast: Secondary | ICD-10-CM | POA: Diagnosis not present

## 2022-06-15 DIAGNOSIS — Z170 Estrogen receptor positive status [ER+]: Secondary | ICD-10-CM | POA: Diagnosis not present

## 2022-06-15 DIAGNOSIS — Z510 Encounter for antineoplastic radiation therapy: Secondary | ICD-10-CM | POA: Diagnosis not present

## 2022-06-15 LAB — RAD ONC ARIA SESSION SUMMARY
Course Elapsed Days: 29
Plan Fractions Treated to Date: 4
Plan Prescribed Dose Per Fraction: 2 Gy
Plan Total Fractions Prescribed: 5
Plan Total Prescribed Dose: 10 Gy
Reference Point Dosage Given to Date: 8 Gy
Reference Point Session Dosage Given: 2 Gy
Session Number: 20

## 2022-06-16 ENCOUNTER — Ambulatory Visit
Admission: RE | Admit: 2022-06-16 | Discharge: 2022-06-16 | Disposition: A | Discharge status: Home / Self Care | Payer: PPO | Source: Ambulatory Visit | Attending: Radiation Oncology | Admitting: Radiation Oncology

## 2022-06-16 ENCOUNTER — Other Ambulatory Visit: Payer: Self-pay

## 2022-06-16 DIAGNOSIS — C50011 Malignant neoplasm of nipple and areola, right female breast: Secondary | ICD-10-CM | POA: Diagnosis not present

## 2022-06-16 LAB — RAD ONC ARIA SESSION SUMMARY
Course Elapsed Days: 30
Plan Fractions Treated to Date: 5
Plan Prescribed Dose Per Fraction: 2 Gy
Plan Total Fractions Prescribed: 5
Plan Total Prescribed Dose: 10 Gy
Reference Point Dosage Given to Date: 10 Gy
Reference Point Session Dosage Given: 2 Gy
Session Number: 21

## 2022-06-17 ENCOUNTER — Telehealth: Payer: Self-pay | Admitting: *Deleted

## 2022-06-17 ENCOUNTER — Encounter: Payer: Self-pay | Admitting: *Deleted

## 2022-06-17 NOTE — Telephone Encounter (Signed)
Spoke with [name], the patient's daughter, explaining that for the radiation burn, aloe can be used or she can alternate aloe and rejuvaskin.  Encouraged her not to slather it on but to use a small amount.

## 2022-06-17 NOTE — Telephone Encounter (Signed)
The daughter of th Hannah [name] pt called to see what she can help the pt. for the burn from radiation. she would like you to call her about what she can do to help. number [phone].

## 2022-06-28 ENCOUNTER — Inpatient Hospital Stay: Payer: PPO | Attending: Oncology | Admitting: Oncology

## 2022-06-28 ENCOUNTER — Encounter: Payer: Self-pay | Admitting: Oncology

## 2022-06-28 VITALS — BP 136/56 | HR 77 | Temp 97.8°F | Resp 18 | Wt 114.4 lb

## 2022-06-28 DIAGNOSIS — C50811 Malignant neoplasm of overlapping sites of right female breast: Secondary | ICD-10-CM | POA: Diagnosis not present

## 2022-06-28 DIAGNOSIS — C50911 Malignant neoplasm of unspecified site of right female breast: Secondary | ICD-10-CM | POA: Diagnosis not present

## 2022-06-28 DIAGNOSIS — Z170 Estrogen receptor positive status [ER+]: Secondary | ICD-10-CM | POA: Diagnosis not present

## 2022-06-28 DIAGNOSIS — Z803 Family history of malignant neoplasm of breast: Secondary | ICD-10-CM | POA: Diagnosis not present

## 2022-06-28 DIAGNOSIS — I10 Essential (primary) hypertension: Secondary | ICD-10-CM | POA: Diagnosis not present

## 2022-06-28 DIAGNOSIS — Z90710 Acquired absence of both cervix and uterus: Secondary | ICD-10-CM | POA: Insufficient documentation

## 2022-06-28 DIAGNOSIS — Z809 Family history of malignant neoplasm, unspecified: Secondary | ICD-10-CM

## 2022-06-28 NOTE — Assessment & Plan Note (Addendum)
pT1a pNx, no adjuvant chemotherapy needed.  Status post lumpectomy and adjuvant radiation. We discussed about the rationale and potential side effects of adjuvant endocrine therapy with aromatase inhibitor.  Recommend baseline DEXA-not done. Patient declined adjuvant endocrine therapy. Recommend annual mammogram for surveillance.-Diagnostic mammogram bilaterally October 2024.

## 2022-06-28 NOTE — Progress Notes (Signed)
Pt here for follow up post radiation.

## 2022-06-28 NOTE — Assessment & Plan Note (Signed)
patient declined genetic testing.

## 2022-06-28 NOTE — Progress Notes (Signed)
Hematology/Oncology Consult note [phone] [phone]         Patient Care Team: [name], MD as PCP - General (Internal Medicine)  CHIEF COMPLAINTS/REASON FOR VISIT:  Stage I right breast cancer   ASSESSMENT & PLAN:   Cancer Staging  Breast cancer [facility]) Staging form: Breast, AJCC 8th Edition - Clinical stage from 03/17/2022: Stage IA (cT1a, cN0, cM0, G2, ER+, PR-, HER2-) - Signed by Earlie Server, MD on 03/23/2022 - Pathologic stage from 04/05/2022: Stage Unknown (pT1a, pNX, cM0, G2, ER+, PR-, HER2-) - Signed by Earlie Server, MD on 04/20/2022   Breast cancer ([place]) pT1a pNx, no adjuvant chemotherapy needed.  Status post lumpectomy and adjuvant radiation. We discussed about the rationale and potential side effects of adjuvant endocrine therapy with aromatase inhibitor.  Recommend baseline DEXA-not done. Patient declined adjuvant endocrine therapy. Recommend annual mammogram for surveillance.-Diagnostic mammogram bilaterally October 2024.  Family history of cancer patient declined genetic testing.     Orders Placed This Encounter  Procedures   MM 3D DIAGNOSTIC MAMMOGRAM BILATERAL BREAST    Standing Status:   Future    Standing Expiration Date:   06/28/2023    Order Specific Question:   Reason for Exam (SYMPTOM  OR DIAGNOSIS REQUIRED)    Answer:   Breast cancer    Order Specific Question:   Preferred imaging location?    Answer:   [facility]   [place] LIMITED ULTRASOUND INCLUDING AXILLA LEFT BREAST     Standing Status:   Future    Standing Expiration Date:   06/28/2023    Order Specific Question:   Reason for Exam (SYMPTOM  OR DIAGNOSIS REQUIRED)    Answer:   Breast cancer    Order Specific Question:   Preferred imaging location?    Answer:   [facility]   [place] LIMITED ULTRASOUND INCLUDING AXILLA RIGHT BREAST    Standing Status:   Future    Standing Expiration Date:   06/28/2023    Order Specific Question:   Reason for Exam (SYMPTOM  OR  DIAGNOSIS REQUIRED)    Answer:   Breast cancer    Order Specific Question:   Preferred imaging location?    Answer:   [facility]   CBC with Differential ([place] Only)    Standing Status:   Future    Standing Expiration Date:   06/28/2023   CMP ([place] only)    Standing Status:   Future    Standing Expiration Date:   06/28/2023   Follow up 6 months. All questions were answered. The patient knows to call the clinic with any problems, questions or concerns.  Earlie Server, MD, PhD [facility] Hematology Oncology 06/28/2022   HISTORY OF PRESENTING ILLNESS:   Hannah [name] is a  82 y.o.  female present for follow up of  right breast cancer.   Oncology History  Breast cancer ([place])  01/24/2022 Imaging   Bilateral screening mammogram  In the right breast, a possible asymmetry warrants further evaluation. In the left breast, no findings suspicious for malignancy.    02/21/2022 Imaging   Unilateral breast mammogram and ultrasound  Showed irregular hypoechoic mass in the RIGHT breast at the 12 o'clock axis, 3 cm from the nipple, measuring 5 mm, a likely correlate for the mammographic finding. This is a suspicious finding for which ultrasound-guided biopsy is recommended.    03/17/2022 Cancer Staging   Staging form: Breast, AJCC 8th Edition - Clinical stage from 03/17/2022: Stage IA (cT1a,  cN0, cM0, G2, ER+, PR-, HER2-) - Signed by Earlie Server, MD on 03/23/2022 Stage prefix: Initial diagnosis Histologic grading system: 3 grade system   03/17/2022 Initial Diagnosis   Breast cancer ([place])  Right breast mass biopsy showed  Invasive mammary carcinoma with lobular features.  Grade 2, no DCIS, no LVI.  ER 90% positive, PR negative, HER2 negative [facility] 0]   04/05/2022 Surgery   Right lumpectomy showed  Invasive lobular carcinoma, grade 2, no DCIS, no LVI, negative margin pT1a pNx   04/05/2022 Cancer Staging   Staging form: Breast, AJCC 8th Edition - Pathologic stage  from 04/05/2022: Stage Unknown (pT1a, pNX, cM0, G2, ER+, PR-, HER2-) - Signed by Earlie Server, MD on 04/20/2022 Stage prefix: Initial diagnosis Multigene prognostic tests performed: None Histologic grading system: 3 grade system   05/17/2022 - 06/14/2022 Radiation Therapy   Adjuvant right breast radiation     Menarche 82 years of age, Patient has 2 children.  Age at first birth 21 She has previously used birth control pills, no details of duration. She menopause in late 19s. She has a history of hysterectomy. Denies any previous hormone replacement therapy. Denies previous breast biopsy.  Family history is positive for paternal aunt and paternal grandma with breast cancer.  No other family history of cancers.  INTERVAL HISTORY Hannah [name] is a 82 y.o. female who has above history reviewed by me today presents for follow up visit for right breast cancer S/p lumpectomy and adjuvant right breast radiation. Patient was accompanied by husband.  She tolerated radiation well with local skin radiation dermatitis.  Improving.  Otherwise she has no new complaints.   MEDICAL HISTORY:  Past Medical History:  Diagnosis Date   anesthesia    shaking after an injection prior to cataract surgery   Asthma    Breast cancer ([place]) 03/23/2022   Cancer ([place])    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   Chronic UTI    COVID    DVT (deep venous thrombosis) (HCC)    after knee surgery   Dysrhythmia    A-fib   GERD (gastroesophageal reflux disease)    Headache    Migraines   Heart murmur    Hyperlipidemia    Hypertension    Mitral valve prolapse    PONV (postoperative nausea and vomiting)     SURGICAL HISTORY: Past Surgical History:  Procedure Laterality Date   ABDOMINAL HYSTERECTOMY     BACK SURGERY  35 years   BOWEL RESECTION  09/09/2014   Procedure: , bladder instilation;  Surgeon: Dia Crawford III, MD;  Location: [facility] ORS;  Service: General;;   BREAST BIOPSY Right 03/17/2022   [place] Core  Bx, coil clip 12;00, path pending   BREAST BIOPSY Right 03/17/2022   [place] RT BREAST BX W LOC DEV 1ST LESION IMG BX Crawford US GUIDE 03/17/2022 [facility]   BREAST BIOPSY Right 03/31/2022   MM RT RADIO FREQUENCY TAG LOC MAMMO GUIDE 03/31/2022 [facility]   BREAST LUMPECTOMY WITH RADIOFREQUENCY TAG IDENTIFICATION Right 03/31/2022   hologic tag [id number]   CARDIAC CATHETERIZATION Right 09/09/2014   Procedure: CENTRAL LINE INSERTION;  Surgeon: Dia Crawford III, MD;  Location: [facility] ORS;  Service: General;  Laterality: Right;   CATARACT EXTRACTION W/ INTRAOCULAR LENS IMPLANT Right    CATARACT EXTRACTION W/PHACO Left 05/11/2016   Procedure: CATARACT EXTRACTION PHACO AND INTRAOCULAR LENS PLACEMENT ([place]);  Surgeon: Estill Cotta, MD;  Location: [facility] ORS;  Service: Ophthalmology;  Laterality: Left;  [place] 1:25 AP% 25.4  CDE 36.38 Fluid pack lot # [id number]   JOINT REPLACEMENT Right    knee   LAPAROTOMY N/A 09/09/2014   Procedure: EXPLORATORY LAPAROTOMY, small bowel resection;  Surgeon: Dia Crawford III, MD;  Location: [facility];  Service: General;  Laterality: N/A;   NASAL SINUS SURGERY      SOCIAL HISTORY: Social History   Socioeconomic History   Marital status: Married    Spouse name: Not on file   Number of children: Not on file   Years of education: Not on file   Highest education level: Not on file  Occupational History   Not on file  Tobacco Use   Smoking status: Never   Smokeless tobacco: Never  Vaping Use   Vaping Use: Never used  Substance and Sexual Activity   Alcohol use: No   Drug use: No   Sexual activity: Not Currently    Birth control/protection: Surgical    Comment: Hysterectomy  Other Topics Concern   Not on file  Social History Narrative   Not on file   Social Determinants of Health   Financial Resource Strain: Not on file  Food Insecurity: Not on file  Transportation Needs: Not on file  Physical Activity: Not on file  Stress: Not on file  Social Connections:  Not on file  Intimate Partner Violence: Not on file    FAMILY HISTORY: Family History  Problem Relation Age of Onset   Heart disease Mother    Hypertension Mother    Breast cancer Paternal Aunt 12       2 aunts   Breast cancer Paternal Grandmother 76    ALLERGIES:  is allergic to codeine, dilaudid [hydromorphone hcl], meperidine, and vicodin [hydrocodone-acetaminophen].  MEDICATIONS:  Current Outpatient Medications  Medication Sig Dispense Refill   amLODipine (NORVASC) 5 MG tablet Take 5 mg by mouth daily.     aspirin 81 MG tablet Take 81 mg by mouth daily.     Aspirin-Acetaminophen-Caffeine (EXCEDRIN PO) Take 1 tablet by mouth as needed (headaches).     metoprolol succinate (TOPROL-XL) 50 MG 24 hr tablet Take 50 mg by mouth 2 (two) times daily. Take with or immediately following a meal.     omeprazole (PRILOSEC) 40 MG capsule Take 40 mg by mouth daily.     No current facility-administered medications for this visit.    Review of Systems  Constitutional:  Negative for appetite change, chills, fatigue and fever.  HENT:   Negative for hearing loss and voice change.   Eyes:  Negative for eye problems.  Respiratory:  Negative for chest tightness and cough.   Cardiovascular:  Negative for chest pain.  Gastrointestinal:  Negative for abdominal distention, abdominal pain and blood in stool.  Endocrine: Negative for hot flashes.  Genitourinary:  Negative for difficulty urinating and frequency.   Musculoskeletal:  Negative for arthralgias.  Skin:  Negative for itching and rash.  Neurological:  Negative for extremity weakness.  Hematological:  Negative for adenopathy.  Psychiatric/Behavioral:  Negative for confusion.    PHYSICAL EXAMINATION: ECOG PERFORMANCE STATUS: 1 - Symptomatic but completely ambulatory Vitals:   06/28/22 1317  BP: (!) 136/56  Pulse: 77  Resp: 18  Temp: 97.8 F (36.6 C)   Filed Weights   06/28/22 1317  Weight: 114 lb 6.4 oz (51.9 kg)    Physical  Exam Constitutional:      General: She is not in acute distress. HENT:     Head: Normocephalic and atraumatic.  Eyes:  General: No scleral icterus. Cardiovascular:     Rate and Rhythm: Normal rate and regular rhythm.     Heart sounds: Normal heart sounds.  Pulmonary:     Effort: Pulmonary effort is normal. No respiratory distress.     Breath sounds: No wheezing.  Abdominal:     General: Bowel sounds are normal. There is no distension.     Palpations: Abdomen is soft.  Musculoskeletal:        General: No deformity. Normal range of motion.     Cervical back: Normal range of motion and neck supple.  Skin:    General: Skin is warm and dry.     Findings: No erythema or rash.  Neurological:     Mental Status: She is alert and oriented to person, place, and time. Mental status is at baseline.     Cranial Nerves: No cranial nerve deficit.     Coordination: Coordination normal.  Psychiatric:        Mood and Affect: Mood normal.    Due to 0000000 policy, patient can only have one family member accompanied for her visit. We accommodated patient's requests of multiple family members attending today's encounter in a meeting room on the first floor of cancer center. Deferred physical examination due to no examination bed in the meeting room.    LABORATORY DATA:  I have reviewed the data as listed    Latest Ref Rng & Units 06/09/2022    1:26 PM 09/18/2014    5:45 AM 09/14/2014    4:10 AM  CBC  WBC 4.0 - 10.5 K/uL 10.2  12.4  12.2   Hemoglobin 12.0 - 15.0 g/dL 12.8  10.7  11.1   Hematocrit 36.0 - 46.0 % 40.0  31.9  33.1   Platelets 150 - 400 K/uL 274  427  341       Latest Ref Rng & Units 09/18/2014    5:45 AM 09/14/2014    4:10 AM 09/13/2014    8:12 AM  CMP  Glucose 65 - 99 mg/dL 123  143  160   BUN 6 - 20 mg/dL 6  <5  <5   Creatinine 0.44 - 1.00 mg/dL 0.52  0.41  0.44   Sodium 135 - 145 mmol/L 137  135  134   Potassium 3.5 - 5.1 mmol/L 3.9  3.7  3.1   Chloride 101 - 111 mmol/L 103   102  100   CO2 22 - 32 mmol/L 25  29  29    Calcium 8.9 - 10.3 mg/dL 8.4  7.5  7.4   Total Protein 6.5 - 8.1 g/dL 6.2     Total Bilirubin 0.3 - 1.2 mg/dL 0.9     Alkaline Phos 38 - 126 U/L 142     AST 15 - 41 U/L 21     ALT 14 - 54 U/L 24         RADIOGRAPHIC STUDIES: I have personally reviewed the radiological images as listed and agreed with the findings in the report. MM Breast Surgical Specimen  Result Date: 04/05/2022 CLINICAL DATA:  Status post localized RIGHT breast lumpectomy. EXAM: SPECIMEN RADIOGRAPH OF THE RIGHT BREAST COMPARISON:  Previous exam(s). FINDINGS: Status post excision of the RIGHT breast. The RF device and coil shaped clip are present within the specimen. IMPRESSION: Specimen radiograph of the RIGHT breast. Electronically Signed   By: Nolon Nations M.D.   On: 04/05/2022 11:49  MM RT RADIO FREQUENCY TAG LOC MAMMO GUIDE  Result Date:  03/31/2022 CLINICAL DATA:  Preoperative localization prior to right breast lumpectomy. EXAM: MAMMOGRAPHIC GUIDED RADIOFREQUENCY DEVICE LOCALIZATION OF THE RIGHT BREAST COMPARISON:  Previous exam(s). FINDINGS: Patient presents for radiofrequency device localization prior to right breast lumpectomy. I met with the patient and we discussed the procedure of radiofrequency device localization including benefits and alternatives. We discussed the high likelihood of a successful procedure. We discussed the risks of the procedure including infection, bleeding, tissue injury and further surgery. Informed, written consent was given. The usual time-out protocol was performed immediately prior to the procedure. Using mammographic guidance, sterile technique, 1% lidocaine as local anesthesia, a Savi Scout device / radiofrequency tag was used to localize coil shaped post biopsy marker using a superior approach. The follow-up mammogram images confirm that the RF device is in the expected location and are marked for Dr. Windell Moment. Follow-up survey of the  patient confirms the presence of the RF device. The patient tolerated the procedure well and was released from the Breast Center. IMPRESSION: Radiofrequency device localization of the the right breast. No apparent complications. Electronically Signed   By: Fidela Salisbury M.D.   On: 03/31/2022 16:08  MR BREAST BILATERAL W WO CONTRAST INC CAD  Result Date: 03/31/2022 CLINICAL DATA:  Staging exam. Newly diagnosed right breast invasive lobular carcinoma. EXAM: BILATERAL BREAST MRI WITH AND WITHOUT CONTRAST TECHNIQUE: Multiplanar, multisequence MR images of both breasts were obtained prior to and following the intravenous administration of 6 ml of Gadavist Three-dimensional MR images were rendered by post-processing of the original MR data on an independent workstation. The three-dimensional MR images were interpreted, and findings are reported in the following complete MRI report for this study. Three dimensional images were evaluated at the independent interpreting workstation using the DynaCAD thin client. COMPARISON:  None available. FINDINGS: Breast composition: b. Scattered fibroglandular tissue. Background parenchymal enhancement: Mild Right breast: There is susceptibility artifact consistent with biopsy marking clip in the upper central right breast. There is an associated 3 mm focus of enhancement (series 10, image 68). There is no additional suspicious enhancing mass or non mass enhancement in the right breast. Left breast: No mass or abnormal enhancement. Lymph nodes: No abnormal appearing lymph nodes. Ancillary findings:  None. IMPRESSION: 1. There is a 3 mm enhancing focus in the upper central right breast consistent with biopsy proven malignancy. 2. No additional suspicious findings in either breast. 3. No axillary adenopathy. RECOMMENDATION: Continue with treatment plan for known right breast cancer. BI-RADS CATEGORY  6: Known biopsy-proven malignancy. Electronically Signed   By: Audie Pinto M.D.   On: 03/31/2022 11:01

## 2022-06-29 ENCOUNTER — Encounter: Payer: Self-pay | Admitting: *Deleted

## 2022-07-11 ENCOUNTER — Encounter: Payer: Self-pay | Admitting: Urology

## 2022-07-11 ENCOUNTER — Ambulatory Visit (INDEPENDENT_AMBULATORY_CARE_PROVIDER_SITE_OTHER): Payer: PPO | Admitting: Urology

## 2022-07-11 VITALS — BP 131/61 | HR 63 | Ht 66.0 in | Wt 112.0 lb

## 2022-07-11 DIAGNOSIS — Z87440 Personal history of urinary (tract) infections: Secondary | ICD-10-CM

## 2022-07-11 DIAGNOSIS — N3020 Other chronic cystitis without hematuria: Secondary | ICD-10-CM

## 2022-07-11 DIAGNOSIS — N3010 Interstitial cystitis (chronic) without hematuria: Secondary | ICD-10-CM | POA: Diagnosis not present

## 2022-07-11 LAB — URINALYSIS, COMPLETE
Bilirubin, UA: NEGATIVE
Glucose, UA: NEGATIVE
Ketones, UA: NEGATIVE
Leukocytes,UA: NEGATIVE
Nitrite, UA: NEGATIVE
Protein,UA: NEGATIVE
RBC, UA: NEGATIVE
Specific Gravity, UA: 1.02 (ref 1.005–1.030)
Urobilinogen, Ur: 0.2 mg/dL (ref 0.2–1.0)
pH, UA: 6.5 (ref 5.0–7.5)

## 2022-07-11 LAB — MICROSCOPIC EXAMINATION

## 2022-07-11 NOTE — Addendum Note (Signed)
Addended by: Bjorn Loser on: 07/11/2022 11:02 AM   Modules accepted: Level of Service

## 2022-07-11 NOTE — Progress Notes (Signed)
07/11/2022 10:41 AM   Hannah [name] 1940-08-19 [id number]  Referring provider: Chad Cordial, PA-C [id number] [place],  [place] [address]  Chief Complaint  Patient presents with   New Patient (Initial Visit)   interstitial cystitis    HPI: Br 2016: Had fallen with perforated colon and had bilateral hydronephrosis secondary to pelvic abscess  Today Patient is an 82 year old woman who gets approximately 2 bladder infections a year with small-volume frequency and suprapubic discomfort relieved by antibiotics.  She does not think she is infected today  She was nonspecific but said she had interstitial cystitis diagnosed years ago that gives minimal symptoms now that she keeps an eye on her diet  At baseline she voids every 2-3 hours gets up once or twice to urinate.  Flow was reasonable.  She does not Nestl feel empty and can double void a varying amount  She may have had a previous bladder suspension.  She has had low back surgery and a hysterectomy  No history of kidney stones.  Bowel movements normal   PMH: Past Medical History:  Diagnosis Date   anesthesia    shaking after an injection prior to cataract surgery   Asthma    Breast cancer ([place]) 03/23/2022   Cancer ([place])    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   Chronic UTI    COVID    DVT (deep venous thrombosis) (HCC)    after knee surgery   Dysrhythmia    A-fib   GERD (gastroesophageal reflux disease)    Headache    Migraines   Heart murmur    Hyperlipidemia    Hypertension    Mitral valve prolapse    PONV (postoperative nausea and vomiting)     Surgical History: Past Surgical History:  Procedure Laterality Date   ABDOMINAL HYSTERECTOMY     BACK SURGERY  35 years   BOWEL RESECTION  09/09/2014   Procedure: , bladder instilation;  Surgeon: Dia Crawford III, MD;  Location: [facility];  Service: General;;   BREAST BIOPSY Right 03/17/2022   [place] Core Bx, coil clip 12;00, path pending   BREAST  BIOPSY Right 03/17/2022   [place] RT BREAST BX W LOC DEV 1ST LESION IMG BX [place] US GUIDE 03/17/2022 [facility]   BREAST BIOPSY Right 03/31/2022   MM RT RADIO FREQUENCY TAG LOC MAMMO GUIDE 03/31/2022 [facility]   BREAST LUMPECTOMY WITH RADIOFREQUENCY TAG IDENTIFICATION Right 03/31/2022   hologic tag [id number]   CARDIAC CATHETERIZATION Right 09/09/2014   Procedure: CENTRAL LINE INSERTION;  Surgeon: Dia Crawford III, MD;  Location: [facility];  Service: General;  Laterality: Right;   CATARACT EXTRACTION W/ INTRAOCULAR LENS IMPLANT Right    CATARACT EXTRACTION W/PHACO Left 05/11/2016   Procedure: CATARACT EXTRACTION PHACO AND INTRAOCULAR LENS PLACEMENT ([place]);  Surgeon: Estill Cotta, MD;  Location: [facility];  Service: Ophthalmology;  Laterality: Left;  [place] 1:25 AP% 25.4 CDE 36.38 Fluid pack lot # [id number]   JOINT REPLACEMENT Right    knee   LAPAROTOMY N/A 09/09/2014   Procedure: EXPLORATORY LAPAROTOMY, small bowel resection;  Surgeon: Dia Crawford III, MD;  Location: [facility];  Service: General;  Laterality: N/A;   NASAL SINUS SURGERY      Home Medications:  Allergies as of 07/11/2022       Reactions   Codeine Hives   Dilaudid [hydromorphone Hcl] Hives   Meperidine Nausea And Vomiting   GI upset   Vicodin [hydrocodone-acetaminophen] Hives        Medication  List        Accurate as of July 11, 2022 10:41 AM. If you have any questions, ask your nurse or doctor.          amLODipine 5 MG tablet Commonly known as: NORVASC Take 5 mg by mouth daily.   aspirin 81 MG tablet Take 81 mg by mouth daily.   EXCEDRIN PO Take 1 tablet by mouth as needed (headaches).   metoprolol succinate 50 MG 24 hr tablet Commonly known as: TOPROL-XL Take 50 mg by mouth 2 (two) times daily. Take with or immediately following a meal.   omeprazole 40 MG capsule Commonly known as: PRILOSEC Take 40 mg by mouth daily.        Allergies:  Allergies  Allergen Reactions   Codeine Hives   Dilaudid  [Hydromorphone Hcl] Hives   Meperidine Nausea And Vomiting    GI upset   Vicodin [Hydrocodone-Acetaminophen] Hives    Family History: Family History  Problem Relation Age of Onset   Heart disease Mother    Hypertension Mother    Breast cancer Paternal Aunt 95       2 aunts   Breast cancer Paternal Grandmother 70    Social History:  reports that she has never smoked. She has never used smokeless tobacco. She reports that she does not drink alcohol and does not use drugs.  ROS:                                        Physical Exam: There were no vitals taken for this visit.  Constitutional:  Alert and oriented, No acute distress. HEENT: [place] AT, moist mucus membranes.  Trachea midline, no masses. Cardiovascular: No clubbing, cyanosis, or edema. Respiratory: Normal respiratory effort, no increased work of breathing. GI: Abdomen is soft, nontender, nondistended, no abdominal masses GU: On pelvic examination patient had mild shortening of the vagina with a small grade 2 cystocele and grade 1 through 2 rectocele with moderate atrophy.  No stress incontinence.  No obvious mesh extrusion Skin: No rashes, bruises or suspicious lesions. Lymph: No cervical or inguinal adenopathy. Neurologic: Grossly intact, no focal deficits, moving all 4 extremities. Psychiatric: Normal mood and affect.  Laboratory Data: Lab Results  Component Value Date   WBC 10.2 06/09/2022   HGB 12.8 06/09/2022   HCT 40.0 06/09/2022   MCV 95.7 06/09/2022   PLT 274 06/09/2022    Lab Results  Component Value Date   CREATININE 0.52 09/18/2014    No results found for: "PSA"  No results found for: "TESTOSTERONE"  No results found for: "HGBA1C"  Urinalysis    Component Value Date/Time   COLORURINE YELLOW (A) 09/07/2014 1519   APPEARANCEUR CLEAR (A) 09/07/2014 1519   LABSPEC 1.018 09/07/2014 1519   PHURINE 5.0 09/07/2014 1519   GLUCOSEU NEGATIVE 09/07/2014 1519   HGBUR NEGATIVE  09/07/2014 1519   BILIRUBINUR neg 06/07/2022 1132   KETONESUR TRACE (A) 09/07/2014 1519   PROTEINUR Negative 06/07/2022 1132   PROTEINUR NEGATIVE 09/07/2014 1519   UROBILINOGEN 0.2 06/19/2020 1116   NITRITE neg 06/07/2022 1132   NITRITE NEGATIVE 09/07/2014 1519   LEUKOCYTESUR Negative 06/07/2022 1132    Pertinent Imaging: Urine reviewed.  Urine sent for culture.  Chart reviewed  Assessment & Plan: Patient has infrequent bladder infections.  She has had previous bladder surgery.  Overall she does quite well.  She may or may not  have an underlying interstitial cystitis.  Call if culture positive.  Treating infections as needed was discussed.  Role of cystoscopy likely to be normal discussed.  She chose to be seen as needed  1. Interstitial cystitis  - Urinalysis, Complete   No follow-ups on file.  Reece Packer, MD  [facility] [address], [place], [place] [address] [phone]

## 2022-07-14 LAB — CULTURE, URINE COMPREHENSIVE

## 2022-07-20 ENCOUNTER — Ambulatory Visit: Payer: PPO | Attending: Radiation Oncology | Admitting: Radiation Oncology

## 2022-08-19 DIAGNOSIS — R300 Dysuria: Secondary | ICD-10-CM | POA: Diagnosis not present

## 2022-08-19 DIAGNOSIS — N10 Acute tubulo-interstitial nephritis: Secondary | ICD-10-CM | POA: Diagnosis not present

## 2022-08-19 DIAGNOSIS — B3731 Acute candidiasis of vulva and vagina: Secondary | ICD-10-CM | POA: Diagnosis not present

## 2022-08-19 DIAGNOSIS — R110 Nausea: Secondary | ICD-10-CM | POA: Diagnosis not present

## 2022-09-13 DIAGNOSIS — E782 Mixed hyperlipidemia: Secondary | ICD-10-CM | POA: Diagnosis not present

## 2022-09-13 DIAGNOSIS — E538 Deficiency of other specified B group vitamins: Secondary | ICD-10-CM | POA: Diagnosis not present

## 2022-09-20 DIAGNOSIS — E538 Deficiency of other specified B group vitamins: Secondary | ICD-10-CM | POA: Diagnosis not present

## 2022-09-20 DIAGNOSIS — J431 Panlobular emphysema: Secondary | ICD-10-CM | POA: Diagnosis not present

## 2022-09-20 DIAGNOSIS — Z0000 Encounter for general adult medical examination without abnormal findings: Secondary | ICD-10-CM | POA: Diagnosis not present

## 2022-09-20 DIAGNOSIS — E559 Vitamin D deficiency, unspecified: Secondary | ICD-10-CM | POA: Diagnosis not present

## 2022-09-20 DIAGNOSIS — E782 Mixed hyperlipidemia: Secondary | ICD-10-CM | POA: Diagnosis not present

## 2022-09-20 DIAGNOSIS — M818 Other osteoporosis without current pathological fracture: Secondary | ICD-10-CM | POA: Diagnosis not present

## 2022-09-27 DIAGNOSIS — M810 Age-related osteoporosis without current pathological fracture: Secondary | ICD-10-CM | POA: Diagnosis not present

## 2022-10-08 DIAGNOSIS — M19012 Primary osteoarthritis, left shoulder: Secondary | ICD-10-CM | POA: Diagnosis not present

## 2022-10-08 DIAGNOSIS — R001 Bradycardia, unspecified: Secondary | ICD-10-CM | POA: Diagnosis not present

## 2022-10-08 DIAGNOSIS — Z885 Allergy status to narcotic agent status: Secondary | ICD-10-CM | POA: Diagnosis not present

## 2022-10-08 DIAGNOSIS — Z803 Family history of malignant neoplasm of breast: Secondary | ICD-10-CM | POA: Diagnosis not present

## 2022-10-08 DIAGNOSIS — J439 Emphysema, unspecified: Secondary | ICD-10-CM | POA: Diagnosis not present

## 2022-10-08 DIAGNOSIS — Z79899 Other long term (current) drug therapy: Secondary | ICD-10-CM | POA: Diagnosis not present

## 2022-10-08 DIAGNOSIS — S46812A Strain of other muscles, fascia and tendons at shoulder and upper arm level, left arm, initial encounter: Secondary | ICD-10-CM | POA: Diagnosis not present

## 2022-10-08 DIAGNOSIS — E7800 Pure hypercholesterolemia, unspecified: Secondary | ICD-10-CM | POA: Diagnosis not present

## 2022-10-08 DIAGNOSIS — R079 Chest pain, unspecified: Secondary | ICD-10-CM | POA: Diagnosis not present

## 2022-10-08 DIAGNOSIS — M25512 Pain in left shoulder: Secondary | ICD-10-CM | POA: Diagnosis not present

## 2022-10-08 DIAGNOSIS — I10 Essential (primary) hypertension: Secondary | ICD-10-CM | POA: Diagnosis not present

## 2022-10-08 DIAGNOSIS — Z7982 Long term (current) use of aspirin: Secondary | ICD-10-CM | POA: Diagnosis not present

## 2022-10-12 DIAGNOSIS — M5010 Cervical disc disorder with radiculopathy, unspecified cervical region: Secondary | ICD-10-CM | POA: Diagnosis not present

## 2022-10-12 DIAGNOSIS — J208 Acute bronchitis due to other specified organisms: Secondary | ICD-10-CM | POA: Diagnosis not present

## 2022-10-12 DIAGNOSIS — B029 Zoster without complications: Secondary | ICD-10-CM | POA: Diagnosis not present

## 2022-10-12 DIAGNOSIS — U071 COVID-19: Secondary | ICD-10-CM | POA: Diagnosis not present

## 2022-10-12 DIAGNOSIS — K2990 Gastroduodenitis, unspecified, without bleeding: Secondary | ICD-10-CM | POA: Diagnosis not present

## 2022-11-03 DIAGNOSIS — M5010 Cervical disc disorder with radiculopathy, unspecified cervical region: Secondary | ICD-10-CM | POA: Diagnosis not present

## 2022-11-03 DIAGNOSIS — B029 Zoster without complications: Secondary | ICD-10-CM | POA: Diagnosis not present

## 2022-11-03 DIAGNOSIS — N3090 Cystitis, unspecified without hematuria: Secondary | ICD-10-CM | POA: Diagnosis not present

## 2022-12-19 DIAGNOSIS — B353 Tinea pedis: Secondary | ICD-10-CM | POA: Diagnosis not present

## 2022-12-30 DIAGNOSIS — B353 Tinea pedis: Secondary | ICD-10-CM | POA: Diagnosis not present

## 2023-01-02 DIAGNOSIS — I10 Essential (primary) hypertension: Secondary | ICD-10-CM | POA: Diagnosis not present

## 2023-01-02 DIAGNOSIS — R600 Localized edema: Secondary | ICD-10-CM | POA: Diagnosis not present

## 2023-01-23 ENCOUNTER — Ambulatory Visit
Admission: RE | Admit: 2023-01-23 | Discharge: 2023-01-23 | Disposition: A | Discharge status: Home / Self Care | Payer: PPO | Source: Ambulatory Visit | Attending: Oncology | Admitting: Oncology

## 2023-01-23 DIAGNOSIS — C50911 Malignant neoplasm of unspecified site of right female breast: Secondary | ICD-10-CM | POA: Insufficient documentation

## 2023-01-23 DIAGNOSIS — Z923 Personal history of irradiation: Secondary | ICD-10-CM | POA: Diagnosis not present

## 2023-01-23 DIAGNOSIS — R92323 Mammographic fibroglandular density, bilateral breasts: Secondary | ICD-10-CM | POA: Insufficient documentation

## 2023-01-23 DIAGNOSIS — Z853 Personal history of malignant neoplasm of breast: Secondary | ICD-10-CM | POA: Insufficient documentation

## 2023-01-23 DIAGNOSIS — R921 Mammographic calcification found on diagnostic imaging of breast: Secondary | ICD-10-CM | POA: Diagnosis not present

## 2023-01-23 DIAGNOSIS — Z170 Estrogen receptor positive status [ER+]: Secondary | ICD-10-CM | POA: Insufficient documentation

## 2023-01-31 DIAGNOSIS — C50911 Malignant neoplasm of unspecified site of right female breast: Secondary | ICD-10-CM | POA: Diagnosis not present

## 2023-02-01 DIAGNOSIS — I10 Essential (primary) hypertension: Secondary | ICD-10-CM | POA: Diagnosis not present

## 2023-02-01 DIAGNOSIS — I4719 Other supraventricular tachycardia: Secondary | ICD-10-CM | POA: Diagnosis not present

## 2023-02-01 DIAGNOSIS — R011 Cardiac murmur, unspecified: Secondary | ICD-10-CM | POA: Diagnosis not present

## 2023-02-01 DIAGNOSIS — R0602 Shortness of breath: Secondary | ICD-10-CM | POA: Diagnosis not present

## 2023-02-01 DIAGNOSIS — E782 Mixed hyperlipidemia: Secondary | ICD-10-CM | POA: Diagnosis not present

## 2023-02-01 DIAGNOSIS — R002 Palpitations: Secondary | ICD-10-CM | POA: Diagnosis not present

## 2023-02-01 DIAGNOSIS — J431 Panlobular emphysema: Secondary | ICD-10-CM | POA: Diagnosis not present

## 2023-02-01 DIAGNOSIS — I351 Nonrheumatic aortic (valve) insufficiency: Secondary | ICD-10-CM | POA: Diagnosis not present

## 2023-02-03 ENCOUNTER — Other Ambulatory Visit: Payer: Self-pay | Admitting: Internal Medicine

## 2023-02-03 DIAGNOSIS — I351 Nonrheumatic aortic (valve) insufficiency: Secondary | ICD-10-CM

## 2023-02-03 DIAGNOSIS — R011 Cardiac murmur, unspecified: Secondary | ICD-10-CM

## 2023-02-06 ENCOUNTER — Other Ambulatory Visit: Payer: PPO

## 2023-02-06 ENCOUNTER — Ambulatory Visit: Payer: PPO | Admitting: Oncology

## 2023-02-14 ENCOUNTER — Ambulatory Visit
Admission: RE | Admit: 2023-02-14 | Discharge: 2023-02-14 | Disposition: A | Discharge status: Home / Self Care | Payer: PPO | Source: Ambulatory Visit | Attending: Internal Medicine | Admitting: Internal Medicine

## 2023-02-14 DIAGNOSIS — I351 Nonrheumatic aortic (valve) insufficiency: Secondary | ICD-10-CM | POA: Diagnosis not present

## 2023-02-14 DIAGNOSIS — R011 Cardiac murmur, unspecified: Secondary | ICD-10-CM | POA: Insufficient documentation

## 2023-02-27 ENCOUNTER — Inpatient Hospital Stay: Payer: PPO

## 2023-02-27 ENCOUNTER — Inpatient Hospital Stay: Payer: PPO | Admitting: Oncology

## 2023-03-03 ENCOUNTER — Inpatient Hospital Stay: Payer: PPO | Attending: Oncology

## 2023-03-03 ENCOUNTER — Encounter: Payer: Self-pay | Admitting: Oncology

## 2023-03-03 ENCOUNTER — Inpatient Hospital Stay: Payer: PPO | Admitting: Oncology

## 2023-03-03 VITALS — BP 168/59 | HR 57 | Temp 97.5°F | Resp 18 | Wt 118.5 lb

## 2023-03-03 DIAGNOSIS — Z853 Personal history of malignant neoplasm of breast: Secondary | ICD-10-CM | POA: Diagnosis not present

## 2023-03-03 DIAGNOSIS — Z923 Personal history of irradiation: Secondary | ICD-10-CM | POA: Diagnosis not present

## 2023-03-03 DIAGNOSIS — Z90710 Acquired absence of both cervix and uterus: Secondary | ICD-10-CM | POA: Diagnosis not present

## 2023-03-03 DIAGNOSIS — Z170 Estrogen receptor positive status [ER+]: Secondary | ICD-10-CM

## 2023-03-03 DIAGNOSIS — Z809 Family history of malignant neoplasm, unspecified: Secondary | ICD-10-CM

## 2023-03-03 DIAGNOSIS — C50911 Malignant neoplasm of unspecified site of right female breast: Secondary | ICD-10-CM

## 2023-03-03 DIAGNOSIS — Z803 Family history of malignant neoplasm of breast: Secondary | ICD-10-CM | POA: Diagnosis not present

## 2023-03-03 LAB — CMP (CANCER CENTER ONLY)
ALT: 14 U/L (ref 0–44)
AST: 16 U/L (ref 15–41)
Albumin: 4 g/dL (ref 3.5–5.0)
Alkaline Phosphatase: 44 U/L (ref 38–126)
Anion gap: 10 (ref 5–15)
BUN: 17 mg/dL (ref 8–23)
CO2: 28 mmol/L (ref 22–32)
Calcium: 9.2 mg/dL (ref 8.9–10.3)
Chloride: 104 mmol/L (ref 98–111)
Creatinine: 0.61 mg/dL (ref 0.44–1.00)
GFR, Estimated: >60 mL/min (ref >60)
Glucose, Bld: 102 mg/dL — ABNORMAL HIGH (ref 70–99)
Potassium: 3.8 mmol/L (ref 3.5–5.1)
Sodium: 142 mmol/L (ref 135–145)
Total Bilirubin: 0.5 mg/dL (ref <1.2)
Total Protein: 6.4 g/dL — ABNORMAL LOW (ref 6.5–8.1)

## 2023-03-03 LAB — CBC WITH DIFFERENTIAL (CANCER CENTER ONLY)
Abs Immature Granulocytes: 0.03 10*3/uL (ref 0.00–0.07)
Basophils Absolute: 0.1 10*3/uL (ref 0.0–0.1)
Basophils Relative: 1 %
Eosinophils Absolute: 0.2 10*3/uL (ref 0.0–0.5)
Eosinophils Relative: 3 %
HCT: 39.6 % (ref 36.0–46.0)
Hemoglobin: 12.7 g/dL (ref 12.0–15.0)
Immature Granulocytes: 0 %
Lymphocytes Relative: 23 %
Lymphs Abs: 1.7 10*3/uL (ref 0.7–4.0)
MCH: 30.1 pg (ref 26.0–34.0)
MCHC: 32.1 g/dL (ref 30.0–36.0)
MCV: 93.8 fL (ref 80.0–100.0)
Monocytes Absolute: 0.8 10*3/uL (ref 0.1–1.0)
Monocytes Relative: 10 %
Neutro Abs: 4.7 10*3/uL (ref 1.7–7.7)
Neutrophils Relative %: 63 %
Platelet Count: 269 10*3/uL (ref 150–400)
RBC: 4.22 MIL/uL (ref 3.87–5.11)
RDW: 13.4 % (ref 11.5–15.5)
WBC Count: 7.5 10*3/uL (ref 4.0–10.5)
nRBC: 0 % (ref 0.0–0.2)

## 2023-03-03 NOTE — Progress Notes (Signed)
Hematology/Oncology Consult note [phone] [phone]         Patient Care Team: Danella Penton, MD as PCP - General (Internal Medicine) Rickard Patience, MD as Consulting Physician (Oncology)  CHIEF COMPLAINTS/REASON FOR VISIT:  Stage I right breast cancer   ASSESSMENT & PLAN:   Cancer Staging  Breast cancer [facility]) Staging form: Breast, AJCC 8th Edition - Clinical stage from 03/17/2022: Stage IA (cT1a, cN0, cM0, G2, ER+, PR-, HER2-) - Signed by Rickard Patience, MD on 03/23/2022 - Pathologic stage from 04/05/2022: Stage Unknown (pT1a, pNX, cM0, G2, ER+, PR-, HER2-) - Signed by Rickard Patience, MD on 04/20/2022   Breast cancer (HCC) pT1a pNx, no adjuvant chemotherapy needed.  Status post lumpectomy and adjuvant radiation. We discussed about the rationale and potential side effects of adjuvant endocrine therapy with aromatase inhibitor.  Recommend baseline DEXA-not done. Patient declined adjuvant endocrine therapy. Recommend annual mammogram for surveillance.-Diagnostic mammogram bilaterally October 2025.    Orders Placed This Encounter  Procedures   [place] LIMITED ULTRASOUND INCLUDING AXILLA LEFT BREAST     Standing Status:   Future    Standing Expiration Date:   03/02/2024    Order Specific Question:   Reason for Exam (SYMPTOM  OR DIAGNOSIS REQUIRED)    Answer:   hx breast cancer    Order Specific Question:   Preferred imaging location?    Answer:   [facility]   [place] LIMITED ULTRASOUND INCLUDING AXILLA RIGHT BREAST    Standing Status:   Future    Standing Expiration Date:   03/02/2024    Order Specific Question:   Reason for Exam (SYMPTOM  OR DIAGNOSIS REQUIRED)    Answer:   hx breast cancer    Order Specific Question:   Preferred imaging location?    Answer:   [facility]   MM 3D DIAGNOSTIC MAMMOGRAM BILATERAL BREAST    Standing Status:   Future    Standing Expiration Date:   03/02/2024    Order Specific Question:   Reason for Exam (SYMPTOM  OR DIAGNOSIS  REQUIRED)    Answer:   hx breast cancer    Order Specific Question:   [facility]    [facility]   Follow up 12 months. All questions were answered. The patient knows to call the clinic with any problems, questions or concerns.  Rickard Patience, MD, PhD [facility] Hematology Oncology 03/03/2023   HISTORY OF PRESENTING ILLNESS:   Hannah [name] is a  82 y.o.  female present for follow up of  right breast cancer.   Oncology History  Breast cancer (HCC)  01/24/2022 Imaging   Bilateral screening mammogram  In the right breast, a possible asymmetry warrants further evaluation. In the left breast, no findings suspicious for malignancy.    02/21/2022 Imaging   Unilateral breast mammogram and ultrasound  Showed irregular hypoechoic mass in the RIGHT breast at the 12 o'clock axis, 3 cm from the nipple, measuring 5 mm, a likely correlate for the mammographic finding. This is a suspicious finding for which ultrasound-guided biopsy is recommended.    03/17/2022 Cancer Staging   Staging form: Breast, AJCC 8th Edition - Clinical stage from 03/17/2022: Stage IA (cT1a, cN0, cM0, G2, ER+, PR-, HER2-) - Signed by Rickard Patience, MD on 03/23/2022 Stage prefix: Initial diagnosis Histologic grading system: 3 grade system   03/17/2022 Initial Diagnosis   Breast cancer (HCC)  Right breast mass biopsy showed  Invasive mammary carcinoma with lobular features.  Grade 2, no DCIS, no LVI.  ER 90% positive, PR negative, HER2 negative [facility] 0]   04/05/2022 Surgery   Right lumpectomy showed  Invasive lobular carcinoma, grade 2, no DCIS, no LVI, negative margin pT1a pNx   04/05/2022 Cancer Staging   Staging form: Breast, AJCC 8th Edition - Pathologic stage from 04/05/2022: Stage Unknown (pT1a, pNX, cM0, G2, ER+, PR-, HER2-) - Signed by Rickard Patience, MD on 04/20/2022 Stage prefix: Initial diagnosis Multigene prognostic tests performed: None Histologic grading system: 3 grade  system   05/17/2022 - 06/14/2022 Radiation Therapy   Adjuvant right breast radiation     Menarche 82 years of age, Patient has 2 children.  Age at first birth 57 She has previously used birth control pills, no details of duration. She menopause in late 43s. She has a history of hysterectomy. Denies any previous hormone replacement therapy. Denies previous breast biopsy.  Family history is positive for paternal aunt and paternal grandma with breast cancer.  No other family history of cancers. S/p lumpectomy and adjuvant right breast radiation.   INTERVAL HISTORY Hannah [name] is a 82 y.o. female who has above history reviewed by me today presents for follow up visit for right breast cancer Patient was accompanied by husband. she has no new breast complaints.   MEDICAL HISTORY:  Past Medical History:  Diagnosis Date   anesthesia    shaking after an injection prior to cataract surgery   Asthma    Breast cancer (HCC) 03/23/2022   Cancer (HCC)    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   Chronic UTI    COVID    DVT (deep venous thrombosis) (HCC)    after knee surgery   Dysrhythmia    A-fib   GERD (gastroesophageal reflux disease)    Headache    Migraines   Heart murmur    Hyperlipidemia    Hypertension    Mitral valve prolapse    PONV (postoperative nausea and vomiting)     SURGICAL HISTORY: Past Surgical History:  Procedure Laterality Date   ABDOMINAL HYSTERECTOMY     BACK SURGERY  35 years   BOWEL RESECTION  09/09/2014   Procedure: , bladder instilation;  Surgeon: Tiney Rouge III, MD;  Location: [facility];  Service: General;;   BREAST BIOPSY Right 03/17/2022   [place] Core Bx, coil clip 12;00,lobular ca   BREAST BIOPSY Right 03/17/2022   [place] RT BREAST BX W LOC DEV 1ST LESION IMG BX SPEC US GUIDE 03/17/2022 [facility]   BREAST BIOPSY Right 03/31/2022   MM RT RADIO FREQUENCY TAG LOC MAMMO GUIDE 03/31/2022 [facility]   BREAST LUMPECTOMY Right 04/05/2022    lumpectomy invasive lobular breast ca, 4 mm closest margin post/deep, only radation   BREAST LUMPECTOMY WITH RADIOFREQUENCY TAG IDENTIFICATION Right 03/31/2022   hologic tag [id number]   CARDIAC CATHETERIZATION Right 09/09/2014   Procedure: CENTRAL LINE INSERTION;  Surgeon: Tiney Rouge III, MD;  Location: [facility];  Service: General;  Laterality: Right;   CATARACT EXTRACTION W/ INTRAOCULAR LENS IMPLANT Right    CATARACT EXTRACTION W/PHACO Left 05/11/2016   Procedure: CATARACT EXTRACTION PHACO AND INTRAOCULAR LENS PLACEMENT (IOC);  Surgeon: Sallee Lange, MD;  Location: [facility];  Service: Ophthalmology;  Laterality: Left;  [place] 1:25 AP% 25.4 CDE 36.38 Fluid pack lot # [id number]   JOINT REPLACEMENT Right    knee   LAPAROTOMY N/A 09/09/2014   Procedure: EXPLORATORY LAPAROTOMY, small bowel resection;  Surgeon: Tiney Rouge III, MD;  Location: [facility];  Service:  General;  Laterality: N/A;   NASAL SINUS SURGERY      SOCIAL HISTORY: Social History   Socioeconomic History   Marital status: Married    Spouse name: Not on file   Number of children: Not on file   Years of education: Not on file   Highest education level: Not on file  Occupational History   Not on file  Tobacco Use   Smoking status: Never   Smokeless tobacco: Never  Vaping Use   Vaping status: Never Used  Substance and Sexual Activity   Alcohol use: No   Drug use: No   Sexual activity: Not Currently    Birth control/protection: Surgical    Comment: Hysterectomy  Other Topics Concern   Not on file  Social History Narrative   Not on file   Social Determinants of Health   Financial Resource Strain: Not on file  Food Insecurity: Not on file  Transportation Needs: Not on file  Physical Activity: Not on file  Stress: Not on file  Social Connections: Not on file  Intimate Partner Violence: Not on file    FAMILY HISTORY: Family History  Problem Relation Age of Onset   Heart disease Mother    Hypertension Mother     Breast cancer Paternal Aunt 22       2 aunts   Breast cancer Paternal Grandmother 20    ALLERGIES:  is allergic to codeine, dilaudid [hydromorphone hcl], meperidine, and vicodin [hydrocodone-acetaminophen].  MEDICATIONS:  Current Outpatient Medications  Medication Sig Dispense Refill   amLODipine (NORVASC) 5 MG tablet Take 5 mg by mouth daily.     aspirin 81 MG tablet Take 81 mg by mouth daily.     Aspirin-Acetaminophen-Caffeine (EXCEDRIN PO) Take 1 tablet by mouth as needed (headaches).     metoprolol succinate (TOPROL-XL) 50 MG 24 hr tablet Take 50 mg by mouth 2 (two) times daily. Take with or immediately following a meal.     omeprazole (PRILOSEC) 40 MG capsule Take 40 mg by mouth daily.     No current facility-administered medications for this visit.    Review of Systems  Constitutional:  Negative for appetite change, chills, fatigue and fever.  HENT:   Negative for hearing loss and voice change.   Eyes:  Negative for eye problems.  Respiratory:  Negative for chest tightness and cough.   Cardiovascular:  Negative for chest pain.  Gastrointestinal:  Negative for abdominal distention, abdominal pain and blood in stool.  Endocrine: Negative for hot flashes.  Genitourinary:  Negative for difficulty urinating and frequency.   Musculoskeletal:  Negative for arthralgias.  Skin:  Negative for itching and rash.  Neurological:  Negative for extremity weakness.  Hematological:  Negative for adenopathy.  Psychiatric/Behavioral:  Negative for confusion.    PHYSICAL EXAMINATION: ECOG PERFORMANCE STATUS: 1 - Symptomatic but completely ambulatory Vitals:   03/03/23 1153  BP: (!) 168/59  Pulse: (!) 57  Resp: 18  Temp: (!) 97.5 F (36.4 C)   Filed Weights   03/03/23 1153  Weight: 118 lb 8 oz (53.8 kg)    Physical Exam Constitutional:      General: She is not in acute distress. HENT:     Head: Normocephalic and atraumatic.  Eyes:     General: No scleral  icterus. Cardiovascular:     Rate and Rhythm: Normal rate and regular rhythm.  Pulmonary:     Effort: Pulmonary effort is normal. No respiratory distress.     Breath sounds: No wheezing.  Abdominal:     General: Bowel sounds are normal. There is no distension.     Palpations: Abdomen is soft.  Musculoskeletal:        General: Normal range of motion.     Cervical back: Normal range of motion and neck supple.  Skin:    General: Skin is warm and dry.     Findings: No rash.  Neurological:     Mental Status: She is alert and oriented to person, place, and time. Mental status is at baseline.  Psychiatric:        Mood and Affect: Mood normal.   LABORATORY DATA:  I have reviewed the data as listed    Latest Ref Rng & Units 03/03/2023   11:35 AM 06/09/2022    1:26 PM 09/18/2014    5:45 AM  CBC  WBC 4.0 - 10.5 K/uL 7.5  10.2  12.4   Hemoglobin 12.0 - 15.0 g/dL 29.5  62.1  30.8   Hematocrit 36.0 - 46.0 % 39.6  40.0  31.9   Platelets 150 - 400 K/uL 269  274  427       Latest Ref Rng & Units 03/03/2023   11:35 AM 09/18/2014    5:45 AM 09/14/2014    4:10 AM  CMP  Glucose 70 - 99 mg/dL 657  846  962   BUN 8 - 23 mg/dL 17  6  <5   Creatinine 0.44 - 1.00 mg/dL 9.52  8.41  3.24   Sodium 135 - 145 mmol/L 142  137  135   Potassium 3.5 - 5.1 mmol/L 3.8  3.9  3.7   Chloride 98 - 111 mmol/L 104  103  102   CO2 22 - 32 mmol/L 28  25  29    Calcium 8.9 - 10.3 mg/dL 9.2  8.4  7.5   Total Protein 6.5 - 8.1 g/dL 6.4  6.2    Total Bilirubin <1.2 mg/dL 0.5  0.9    Alkaline Phos 38 - 126 U/L 44  142    AST 15 - 41 U/L 16  21    ALT 0 - 44 U/L 14  24        RADIOGRAPHIC STUDIES: I have personally reviewed the radiological images as listed and agreed with the findings in the report. CT CARDIAC SCORING (SELF PAY ONLY)  Addendum Date: 02/26/2023   ADDENDUM REPORT: 02/26/2023 12:13 EXAM: OVER-READ INTERPRETATION  CT CHEST The following report is an over-read performed by radiologist Dr. Royal Piedra [facility] on 02/26/2023. This over-read does not include interpretation of cardiac or coronary anatomy or pathology. The coronary calcium score interpretation by the cardiologist is attached. COMPARISON:  None available. FINDINGS: Within the visualized portions of the thorax there are no suspicious appearing pulmonary nodules or masses, there is no acute consolidative airspace disease, no pleural effusions, no pneumothorax and no lymphadenopathy. Atherosclerotic calcifications are noted in the thoracic aorta. Visualized portions of the upper abdomen are unremarkable. There are no aggressive appearing lytic or blastic lesions noted in the visualized portions of the skeleton. IMPRESSION: 1. Aortic Atherosclerosis (ICD10-I70.0). Electronically Signed   By: Trudie Reed M.D.   On: 02/26/2023 12:13   Result Date: 02/26/2023 CLINICAL DATA:  Risk stratification EXAM: Coronary Calcium Score TECHNIQUE: The patient was scanned on a Siemens Somatom scanner. Axial non-contrast 3 mm slices were carried out through the heart. The data set was analyzed on a dedicated work station and scored using the Agatson method. FINDINGS: Non-cardiac:  See separate report from [facility]. Ascending Aorta: Normal size Pericardium: Normal Coronary arteries: Normal origin of left and right coronary arteries. Distribution of arterial calcifications if present, as noted below; LM 0 LAD 10.4 LCx 0 RCA 0 Total 10.4 IMPRESSION AND RECOMMENDATION: 1. Coronary calcium score of 10.4. This was 13th percentile for age and sex matched control. 2. CAC 1-99 in LAD. CAC-DRS A1/N1. 3. Continue heart healthy lifestyle and risk factor modification. Electronically Signed: By: Debbe Odea M.D. On: 02/14/2023 10:36   MM 3D DIAGNOSTIC MAMMOGRAM BILATERAL BREAST  Result Date: 01/23/2023 CLINICAL DATA:  History of right breast cancer status lumpectomy in December 2023 with adjuvant radiation. Patient is due for  annual exam. No breast complaints today. EXAM: DIGITAL DIAGNOSTIC BILATERAL MAMMOGRAM WITH TOMOSYNTHESIS AND CAD TECHNIQUE: Bilateral digital diagnostic mammography and breast tomosynthesis was performed. The images were evaluated with computer-aided detection. COMPARISON:  Previous exam(s). ACR Breast Density Category b: There are scattered areas of fibroglandular density. FINDINGS: Interval postoperative changes in the central upper right breast at posterior depth. A benign rim calcification is seen at the lumpectomy site. Mild diffuse skin and trabecular thickening in the right breast, consistent with postradiation treatment changes. No suspicious mass, calcification or other finding in either breast. IMPRESSION: Expected interval postoperative and postradiation treatment changes of the right breast. No mammographic findings of malignancy in either breast. RECOMMENDATION: Bilateral diagnostic mammogram in 1 year. I have discussed the findings and recommendations with the patient. If applicable, a reminder letter will be sent to the patient regarding the next appointment. BI-RADS CATEGORY  2: Benign. Electronically Signed   By: Sherron Ales M.D.   On: 01/23/2023 10:44

## 2023-03-03 NOTE — Assessment & Plan Note (Addendum)
pT1a pNx, no adjuvant chemotherapy needed.  Status post lumpectomy and adjuvant radiation. We discussed about the rationale and potential side effects of adjuvant endocrine therapy with aromatase inhibitor.  Recommend baseline DEXA-not done. Patient declined adjuvant endocrine therapy. Recommend annual mammogram for surveillance.-Diagnostic mammogram bilaterally October 2025.

## 2023-03-07 DIAGNOSIS — R300 Dysuria: Secondary | ICD-10-CM | POA: Diagnosis not present

## 2023-03-24 DIAGNOSIS — J431 Panlobular emphysema: Secondary | ICD-10-CM | POA: Diagnosis not present

## 2023-03-24 DIAGNOSIS — E782 Mixed hyperlipidemia: Secondary | ICD-10-CM | POA: Diagnosis not present

## 2023-03-24 DIAGNOSIS — E559 Vitamin D deficiency, unspecified: Secondary | ICD-10-CM | POA: Diagnosis not present

## 2023-03-24 DIAGNOSIS — M818 Other osteoporosis without current pathological fracture: Secondary | ICD-10-CM | POA: Diagnosis not present

## 2023-03-24 DIAGNOSIS — E538 Deficiency of other specified B group vitamins: Secondary | ICD-10-CM | POA: Diagnosis not present

## 2023-07-03 DIAGNOSIS — J069 Acute upper respiratory infection, unspecified: Secondary | ICD-10-CM | POA: Diagnosis not present

## 2023-07-17 DIAGNOSIS — M1712 Unilateral primary osteoarthritis, left knee: Secondary | ICD-10-CM | POA: Diagnosis not present

## 2023-07-17 DIAGNOSIS — E538 Deficiency of other specified B group vitamins: Secondary | ICD-10-CM | POA: Diagnosis not present

## 2023-07-17 DIAGNOSIS — E782 Mixed hyperlipidemia: Secondary | ICD-10-CM | POA: Diagnosis not present

## 2023-07-17 DIAGNOSIS — J431 Panlobular emphysema: Secondary | ICD-10-CM | POA: Diagnosis not present

## 2023-07-17 DIAGNOSIS — M818 Other osteoporosis without current pathological fracture: Secondary | ICD-10-CM | POA: Diagnosis not present

## 2023-07-17 DIAGNOSIS — C50011 Malignant neoplasm of nipple and areola, right female breast: Secondary | ICD-10-CM | POA: Diagnosis not present

## 2023-07-17 DIAGNOSIS — Z0000 Encounter for general adult medical examination without abnormal findings: Secondary | ICD-10-CM | POA: Diagnosis not present

## 2023-08-11 DIAGNOSIS — N390 Urinary tract infection, site not specified: Secondary | ICD-10-CM | POA: Diagnosis not present

## 2023-08-11 DIAGNOSIS — R300 Dysuria: Secondary | ICD-10-CM | POA: Diagnosis not present

## 2023-09-19 ENCOUNTER — Encounter: Payer: Self-pay | Admitting: Obstetrics and Gynecology

## 2023-09-19 ENCOUNTER — Ambulatory Visit: Admitting: Obstetrics and Gynecology

## 2023-09-19 ENCOUNTER — Encounter: Payer: Self-pay | Admitting: *Deleted

## 2023-09-19 VITALS — BP 170/70 | HR 57 | Ht 66.0 in | Wt 114.0 lb

## 2023-09-19 DIAGNOSIS — C50911 Malignant neoplasm of unspecified site of right female breast: Secondary | ICD-10-CM | POA: Diagnosis not present

## 2023-09-19 DIAGNOSIS — Z87440 Personal history of urinary (tract) infections: Secondary | ICD-10-CM

## 2023-09-19 DIAGNOSIS — Z170 Estrogen receptor positive status [ER+]: Secondary | ICD-10-CM

## 2023-09-19 DIAGNOSIS — N644 Mastodynia: Secondary | ICD-10-CM

## 2023-09-19 LAB — POCT URINALYSIS DIPSTICK
Bilirubin, UA: NEGATIVE
Blood, UA: NEGATIVE
Glucose, UA: NEGATIVE
Ketones, UA: NEGATIVE
Leukocytes, UA: NEGATIVE
Nitrite, UA: NEGATIVE
Protein, UA: NEGATIVE
Spec Grav, UA: 1.01 (ref 1.010–1.025)
Urobilinogen, UA: 0.2 U/dL
pH, UA: 6.5 (ref 5.0–8.0)

## 2023-09-19 NOTE — Progress Notes (Signed)
 Daughter Stana Ear called, her mom is concerned about some odd sensations/feelings in her breast.    She has a breast exam scheduled with her ob/gyn today.   I told Stana Ear that if needed we can get her in with Dr. Wilhelmenia Harada and to let me know if they would like to see her sooner then her next scheduled appt. In October.

## 2023-09-19 NOTE — Patient Instructions (Signed)
 I value your feedback and you entrusting [place] with your care. If you get a [place] patient survey, I would appreciate you taking the time to let us know about your experience today. Thank you! ? ? ?

## 2023-09-19 NOTE — Progress Notes (Signed)
 Hannah Cunning, MD   Chief Complaint  Patient presents with   Breast Exam    Soreness at times on both breasts. Breast cancer surgery Dec 2023.   Urinary Tract Infection    Frequency and burning urinating    HPI:      Hannah [name] is a 83 y.o. [id number] whose LMP was No LMP recorded. Patient has had a hysterectomy., presents today for UTI f/u. Had klebsiella on C&S 08/11/23 and treated with abx (unsure which one) for 5 days. Sx resolved. Wants urine rechecked. Has had occas dysuria and frequency since UTI tx but sx resolve with increased water  intake. Pt drinks some water , good bit of coffee daily.   Pt complains of RT breast soreness intermittently at inferior aspect of breast for the past 2-3 months; sometimes holds her breast up when removing bra with sx improvement. No redness, nipple d/c, masses. Tried heating pad with some relief. No triggers. Drinks lots of caffeine daily.  Hurts to raise RT arm above head as well but pain more in axilla/lumpectomy site.  History of right breast cancer, status lumpectomy in 12/23 with adjuvant radiation; followed by Dr. Wilhelmenia Harada. Has appt 10/25 for f/u; neg mammo 10/24. FH breast cancer pat side.   Patient Active Problem List   Diagnosis Date Noted   Breast cancer (HCC) 03/23/2022   Goals of care, counseling/discussion 03/23/2022   Family history of cancer 03/23/2022   Migraine 06/15/2017   Pelvic pain in female 06/15/2017   Personal history of ovarian cyst 06/15/2017   Reactive airway disease 12/28/2016   Low serum vitamin D 07/04/2016   Medicare annual wellness visit, initial 07/04/2016   'Light-for-dates' infant with signs of fetal malnutrition 12/30/2015   Hyperlipidemia, mixed 12/22/2015   Perforated bowel (HCC) 10/06/2014   Abdominal pain, acute, left lower quadrant 08/05/2014   Hematochezia 08/05/2014   Atrial tachycardia (HCC) 03/27/2014   Benign essential hypertension 08/30/2013   Ovarian cyst, bilateral 12/17/2012   Right  lower quadrant pain 12/17/2012   Chronic cystitis 05/15/2012   Incomplete emptying of bladder 05/15/2012   Microscopic hematuria 05/15/2012   Mixed urge and stress incontinence 05/15/2012   Symptoms involving urinary system 05/15/2012    Past Surgical History:  Procedure Laterality Date   ABDOMINAL HYSTERECTOMY     BACK SURGERY  35 years   BOWEL RESECTION  09/09/2014   Procedure: , bladder instilation;  Surgeon: Rhina Center III, MD;  Location: [facility];  Service: General;;   BREAST BIOPSY Right 03/17/2022   Us  Core Bx, coil clip 12;00,lobular ca   BREAST BIOPSY Right 03/17/2022   US  RT BREAST BX W LOC DEV 1ST LESION IMG BX SPEC US  GUIDE 03/17/2022 [facility]   BREAST BIOPSY Right 03/31/2022   MM RT RADIO FREQUENCY TAG LOC MAMMO GUIDE 03/31/2022 [facility]   BREAST LUMPECTOMY Right 04/05/2022   lumpectomy invasive lobular breast ca, 4 mm closest margin post/deep, only radation   BREAST LUMPECTOMY WITH RADIOFREQUENCY TAG IDENTIFICATION Right 03/31/2022   hologic tag [id number]   CARDIAC CATHETERIZATION Right 09/09/2014   Procedure: CENTRAL LINE INSERTION;  Surgeon: Rhina Center III, MD;  Location: [facility];  Service: General;  Laterality: Right;   CATARACT EXTRACTION W/ INTRAOCULAR LENS IMPLANT Right    CATARACT EXTRACTION W/PHACO Left 05/11/2016   Procedure: CATARACT EXTRACTION PHACO AND INTRAOCULAR LENS PLACEMENT (IOC);  Surgeon: Steven Dingeldein, MD;  Location: [facility];  Service: Ophthalmology;  Laterality: Left;  US  1:25 AP% 25.4 CDE 36.38 Fluid pack  lot # [id number]   JOINT REPLACEMENT Right    knee   LAPAROTOMY N/A 09/09/2014   Procedure: EXPLORATORY LAPAROTOMY, small bowel resection;  Surgeon: Rhina Center III, MD;  Location: [facility];  Service: General;  Laterality: N/A;   NASAL SINUS SURGERY      Family History  Problem Relation Age of Onset   Heart disease Mother    Hypertension Mother    Breast cancer Paternal Aunt 71       2 aunts   Breast cancer Paternal  Grandmother 79    Social History   Socioeconomic History   Marital status: Married    Spouse name: Not on file   Number of children: Not on file   Years of education: Not on file   Highest education level: Not on file  Occupational History   Not on file  Tobacco Use   Smoking status: Never   Smokeless tobacco: Never  Vaping Use   Vaping status: Never Used  Substance and Sexual Activity   Alcohol use: No   Drug use: No   Sexual activity: Not Currently    Birth control/protection: Surgical    Comment: Hysterectomy  Other Topics Concern   Not on file  Social History Narrative   Not on file   Social Drivers of Health   Financial Resource Strain: Low Risk  (07/17/2023)   Received from [facility]   Overall Financial Resource Strain (CARDIA)    Difficulty of Paying Living Expenses: Not hard at all  Food Insecurity: No Food Insecurity (07/17/2023)   Received from [facility]   Hunger Vital Sign    Worried About Running Out of Food in the Last Year: Never true    Ran Out of Food in the Last Year: Never true  Transportation Needs: No Transportation Needs (07/17/2023)   Received from [facility] - Transportation    In the past 12 months, has lack of transportation kept you from medical appointments or from getting medications?: No    Lack of Transportation (Non-Medical): No  Physical Activity: Not on file  Stress: Not on file  Social Connections: Not on file  Intimate Partner Violence: Not on file    Outpatient Medications Prior to Visit  Medication Sig Dispense Refill   alendronate (FOSAMAX) 70 MG tablet Take by mouth.     aspirin 81 MG tablet Take 81 mg by mouth daily.     Aspirin-Acetaminophen -Caffeine (EXCEDRIN PO) Take 1 tablet by mouth as needed (headaches).     losartan (COZAAR) 25 MG tablet Take 25 mg by mouth daily.     metoprolol  succinate (TOPROL -XL) 50 MG 24 hr tablet Take 50 mg by mouth 2 (two) times  daily. Take with or immediately following a meal.     amLODipine  (NORVASC ) 5 MG tablet Take 5 mg by mouth daily. (Patient not taking: Reported on 09/19/2023)     omeprazole (PRILOSEC) 40 MG capsule Take 40 mg by mouth daily.     No facility-administered medications prior to visit.      ROS:  Review of Systems  Constitutional:  Negative for fever.  Gastrointestinal:  Negative for blood in stool, constipation, diarrhea, nausea and vomiting.  Genitourinary:  Negative for dyspareunia, dysuria, flank pain, frequency, hematuria, urgency, vaginal bleeding, vaginal discharge and vaginal pain.  Musculoskeletal:  Negative for back pain.  Skin:  Negative for rash.   BREAST: pain   OBJECTIVE:   Vitals:  BP (!) 170/70  Pulse (!) 57   Ht 5\' 6"  (1.676 m)   Wt 114 lb (51.7 kg)   BMI 18.40 kg/m   Physical Exam Vitals reviewed.  Pulmonary:     Effort: Pulmonary effort is normal.  Chest:  Breasts:    Breasts are symmetrical.     Right: Tenderness present. No inverted nipple, mass, nipple discharge or skin change.     Left: No inverted nipple, mass, nipple discharge, skin change or tenderness.    Musculoskeletal:        General: Normal range of motion.     Cervical back: Normal range of motion.  Skin:    General: Skin is warm and dry.  Neurological:     General: No focal deficit present.     Mental Status: She is alert and oriented to person, place, and time.     Cranial Nerves: No cranial nerve deficit.  Psychiatric:        Mood and Affect: Mood normal.        Behavior: Behavior normal.        Thought Content: Thought content normal.        Judgment: Judgment normal.     Assessment/Plan: History of UTI - Plan: POCT Urinalysis Dipstick, Urine Culture; sx resolved; neg UA today. Check C&S to confirm resolution. Increase water , decrease caffeine.   Breast tenderness--RT breast, neg exam for masses. Reached out to Dr. Jackqueline Mason office and they have her scheduled for f/u on 09/25/23.  LM for pt.   Malignant neoplasm of right breast in female, estrogen receptor positive, unspecified site of breast (HCC); followed by Dr. Wilhelmenia Harada.     Return if symptoms worsen or fail to improve.  Winner Valeriano B. Elwanda Moger, PA-C 09/19/2023 3:25 PM

## 2023-09-21 LAB — URINE CULTURE

## 2023-09-22 ENCOUNTER — Telehealth: Payer: Self-pay | Admitting: *Deleted

## 2023-09-22 NOTE — Telephone Encounter (Signed)
 She needs a breast appointment because she has been having some problems with the breast.  Patient called 2 times because she said someone called her but she could not get to the phone.  I looked in the computer and she has an appointment on 6/16 at 2:30 and she will be seeing Kenney Peacemaker.  She has all the information and she will be there

## 2023-09-25 ENCOUNTER — Encounter: Payer: Self-pay | Admitting: Nurse Practitioner

## 2023-09-25 ENCOUNTER — Inpatient Hospital Stay: Attending: Nurse Practitioner | Admitting: Nurse Practitioner

## 2023-09-25 VITALS — BP 150/48 | HR 64 | Temp 98.3°F | Resp 17 | Wt 115.0 lb

## 2023-09-25 DIAGNOSIS — Z923 Personal history of irradiation: Secondary | ICD-10-CM | POA: Diagnosis not present

## 2023-09-25 DIAGNOSIS — N644 Mastodynia: Secondary | ICD-10-CM | POA: Insufficient documentation

## 2023-09-25 DIAGNOSIS — Z803 Family history of malignant neoplasm of breast: Secondary | ICD-10-CM | POA: Diagnosis not present

## 2023-09-25 DIAGNOSIS — Z90710 Acquired absence of both cervix and uterus: Secondary | ICD-10-CM | POA: Diagnosis not present

## 2023-09-25 DIAGNOSIS — Z853 Personal history of malignant neoplasm of breast: Secondary | ICD-10-CM | POA: Insufficient documentation

## 2023-09-25 NOTE — Progress Notes (Signed)
 Symptom Management Clinic  [facility] at [facility] A Department of the [place]. [facility] [address] [place], [place] [address] [phone] (phone) [phone] (fax)  Patient Care Team: Sari Cunning, MD as PCP - General (Internal Medicine) Timmy Forbes, MD as Consulting Physician (Oncology)   Name of the patient: Hannah [name]  [id number]  [birth year]   Date of visit: 09/25/23  Diagnosis- Breast Cancer   Chief complaint/ Reason for visit- right breast pain  Heme/Onc history:  Oncology History  Breast cancer (HCC)  01/24/2022 Imaging   Bilateral screening mammogram  In the right breast, a possible asymmetry warrants further evaluation. In the left breast, no findings suspicious for malignancy.    02/21/2022 Imaging   Unilateral breast mammogram and ultrasound  Showed irregular hypoechoic mass in the RIGHT breast at the 12 o'clock axis, 3 cm from the nipple, measuring 5 mm, a likely correlate for the mammographic finding. This is a suspicious finding for which ultrasound-guided biopsy is recommended.    03/17/2022 Cancer Staging   Staging form: Breast, AJCC 8th Edition - Clinical stage from 03/17/2022: Stage IA (cT1a, cN0, cM0, G2, ER+, PR-, HER2-) - Signed by Timmy Forbes, MD on 03/23/2022 Stage prefix: Initial diagnosis Histologic grading system: 3 grade system   03/17/2022 Initial Diagnosis   Breast cancer (HCC)  Right breast mass biopsy showed  Invasive mammary carcinoma with lobular features.  Grade 2, no DCIS, no LVI.  ER 90% positive, PR negative, HER2 negative [facility] 0]   04/05/2022 Surgery   Right lumpectomy showed  Invasive lobular carcinoma, grade 2, no DCIS, no LVI, negative margin pT1a pNx   04/05/2022 Cancer Staging   Staging form: Breast, AJCC 8th Edition - Pathologic stage from 04/05/2022: Stage Unknown (pT1a, pNX, cM0, G2, ER+, PR-, HER2-) - Signed by Timmy Forbes, MD on 04/20/2022 Stage prefix:  Initial diagnosis Multigene prognostic tests performed: None Histologic grading system: 3 grade system   05/17/2022 - 06/14/2022 Radiation Therapy   Adjuvant right breast radiation     Interval history- Hannah [name] is a 83 y.o. female who presents to symptom management clinic for complaints of right breast pain. She had lumpectomy in 2024 followed by radiation. She feels like in the past couple of months that she has had increased pain at the site of her prior lumpectomy.  Pain comes and goes but seems to be occurring more frequently.  She has chronic right shoulder pain which is unchanged.  Has not palpated any masses.  Otherwise feels at baseline and denies complaints.  Review of systems- Review of Systems  Constitutional:  Negative for malaise/fatigue and weight loss.  Respiratory:  Negative for sputum production and shortness of breath.   Cardiovascular:  Negative for chest pain.  Neurological:  Negative for dizziness and weakness.  Psychiatric/Behavioral:  Negative for depression. The patient is not nervous/anxious.     Allergies  Allergen Reactions   Codeine Hives   Dilaudid  [Hydromorphone  Hcl] Hives   Meperidine Nausea And Vomiting    GI upset   Vicodin [Hydrocodone-Acetaminophen ] Hives   Past Medical History:  Diagnosis Date   anesthesia    shaking after an injection prior to cataract surgery   Asthma    Breast cancer (HCC) 03/23/2022   Cancer (HCC)    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   Chronic UTI    COVID    DVT (deep venous thrombosis) (HCC)    after knee surgery   Dysrhythmia  A-fib   GERD (gastroesophageal reflux disease)    Headache    Migraines   Heart murmur    Hyperlipidemia    Hypertension    Mitral valve prolapse    PONV (postoperative nausea and vomiting)    Past Surgical History:  Procedure Laterality Date   ABDOMINAL HYSTERECTOMY     BACK SURGERY  35 years   BOWEL RESECTION  09/09/2014   Procedure: , bladder instilation;  Surgeon:  Rhina Center III, MD;  Location: [facility] ORS;  Service: General;;   BREAST BIOPSY Right 03/17/2022   Us  Core Bx, coil clip 12;00,lobular ca   BREAST BIOPSY Right 03/17/2022   US  RT BREAST BX W LOC DEV 1ST LESION IMG BX SPEC US  GUIDE 03/17/2022 [facility]   BREAST BIOPSY Right 03/31/2022   MM RT RADIO FREQUENCY TAG LOC MAMMO GUIDE 03/31/2022 [facility]   BREAST LUMPECTOMY Right 04/05/2022   lumpectomy invasive lobular breast ca, 4 mm closest margin post/deep, only radation   BREAST LUMPECTOMY WITH RADIOFREQUENCY TAG IDENTIFICATION Right 03/31/2022   hologic tag [id number]   CARDIAC CATHETERIZATION Right 09/09/2014   Procedure: CENTRAL LINE INSERTION;  Surgeon: Rhina Center III, MD;  Location: [facility] ORS;  Service: General;  Laterality: Right;   CATARACT EXTRACTION W/ INTRAOCULAR LENS IMPLANT Right    CATARACT EXTRACTION W/PHACO Left 05/11/2016   Procedure: CATARACT EXTRACTION PHACO AND INTRAOCULAR LENS PLACEMENT (IOC);  Surgeon: Steven Dingeldein, MD;  Location: [facility];  Service: Ophthalmology;  Laterality: Left;  US  1:25 AP% 25.4 CDE 36.38 Fluid pack lot # [id number]   JOINT REPLACEMENT Right    knee   LAPAROTOMY N/A 09/09/2014   Procedure: EXPLORATORY LAPAROTOMY, small bowel resection;  Surgeon: Rhina Center III, MD;  Location: [facility];  Service: General;  Laterality: N/A;   NASAL SINUS SURGERY     Social History   Socioeconomic History   Marital status: Married    Spouse name: Not on file   Number of children: Not on file   Years of education: Not on file   Highest education level: Not on file  Occupational History   Not on file  Tobacco Use   Smoking status: Never   Smokeless tobacco: Never  Vaping Use   Vaping status: Never Used  Substance and Sexual Activity   Alcohol use: No   Drug use: No   Sexual activity: Not Currently    Birth control/protection: Surgical    Comment: Hysterectomy  Other Topics Concern   Not on file  Social History Narrative   Not on file   Social  Drivers of Health   Financial Resource Strain: Low Risk  (07/17/2023)   [facility]   Overall Financial Resource Strain (CARDIA)    Difficulty of Paying Living Expenses: Not hard at all  Food Insecurity: No Food Insecurity (07/17/2023)   Received from [facility]   Hunger Vital Sign    Within the past 12 months, you worried that your food would run out before you got the money to buy more.: Never true    Within the past 12 months, the food you bought just didn't last and you didn't have money to get more.: Never true  Transportation Needs: No Transportation Needs (07/17/2023)   Received from [facility] - Transportation    In the past 12 months, has lack of transportation kept you from medical appointments or from getting medications?: No    Lack of Transportation (Non-Medical): No  Physical Activity: Not on file  Stress: Not on file  Social Connections: Not on file  Intimate Partner Violence: Not on file   Family History  Problem Relation Age of Onset   Heart disease Mother    Hypertension Mother    Breast cancer Paternal Aunt 40       2 aunts   Breast cancer Paternal Grandmother 94   Current Outpatient Medications:    alendronate (FOSAMAX) 70 MG tablet, Take by mouth., Disp: , Rfl:    aspirin 81 MG tablet, Take 81 mg by mouth daily., Disp: , Rfl:    Aspirin-Acetaminophen -Caffeine (EXCEDRIN PO), Take 1 tablet by mouth as needed (headaches)., Disp: , Rfl:    losartan (COZAAR) 25 MG tablet, Take 25 mg by mouth daily., Disp: , Rfl:    metoprolol  succinate (TOPROL -XL) 50 MG 24 hr tablet, Take 50 mg by mouth 2 (two) times daily. Take with or immediately following a meal., Disp: , Rfl:    amLODipine  (NORVASC ) 5 MG tablet, Take 5 mg by mouth daily. (Patient not taking: Reported on 09/25/2023), Disp: , Rfl:   Physical exam:  Vitals:   09/25/23 1453 09/25/23 1456  BP: (!) 146/47 (!) 150/48  Pulse: 64   Resp: 17    Temp: 98.3 F (36.8 C)   TempSrc: Tympanic   SpO2: 100%   Weight: 115 lb (52.2 kg)    Physical Exam Vitals reviewed.  Constitutional:      Appearance: She is not ill-appearing.  Chest:  Breasts:    Right: Tenderness present. No swelling, mass or skin change.      Comments: Tenderness below lumpectomy. No palpable nodules or adenopathy. Flattened nipple Lymphadenopathy:     Upper Body:     Right upper body: No supraclavicular or axillary adenopathy.   Neurological:     Mental Status: She is alert.     Assessment and plan- Patient is a 83 y.o. female who presents to symptom management clinic for  Right breast pain-no palpable masses but reproducible tenderness.  Recommend diagnostic mammogram with ultrasound to evaluate further. History of right breast cancer-pT1a pNX.  Notable.  She is status postlumpectomy and radiation.  Declined adjuvant endocrine therapy.  Last mammogram was BI-RADS 2 in October 2024.   Visit Diagnosis 1. Pain of right breast    Patient expressed understanding and was in agreement with this plan. She also understands that She can call clinic at any time with any questions, concerns, or complaints.   Thank you for allowing me to participate in the care of this very pleasant patient.   Kenney Peacemaker, DNP, AGNP-C, AOCNP [facility] [phone]  CC: Dr Wilhelmenia Harada

## 2023-10-04 ENCOUNTER — Ambulatory Visit
Admission: RE | Admit: 2023-10-04 | Discharge: 2023-10-04 | Disposition: A | Discharge status: Home / Self Care | Source: Ambulatory Visit | Attending: Nurse Practitioner | Admitting: Nurse Practitioner

## 2023-10-04 DIAGNOSIS — N644 Mastodynia: Secondary | ICD-10-CM

## 2023-10-04 DIAGNOSIS — N6011 Diffuse cystic mastopathy of right breast: Secondary | ICD-10-CM | POA: Diagnosis not present

## 2023-10-04 DIAGNOSIS — R92321 Mammographic fibroglandular density, right breast: Secondary | ICD-10-CM | POA: Diagnosis not present

## 2023-10-05 ENCOUNTER — Ambulatory Visit: Payer: Self-pay | Admitting: Nurse Practitioner

## 2023-11-22 DIAGNOSIS — B351 Tinea unguium: Secondary | ICD-10-CM | POA: Diagnosis not present

## 2023-12-13 ENCOUNTER — Other Ambulatory Visit: Payer: Self-pay | Admitting: General Surgery

## 2023-12-13 DIAGNOSIS — Z853 Personal history of malignant neoplasm of breast: Secondary | ICD-10-CM

## 2023-12-13 DIAGNOSIS — C50911 Malignant neoplasm of unspecified site of right female breast: Secondary | ICD-10-CM

## 2024-01-09 DIAGNOSIS — E538 Deficiency of other specified B group vitamins: Secondary | ICD-10-CM | POA: Diagnosis not present

## 2024-01-09 DIAGNOSIS — E782 Mixed hyperlipidemia: Secondary | ICD-10-CM | POA: Diagnosis not present

## 2024-01-16 DIAGNOSIS — E538 Deficiency of other specified B group vitamins: Secondary | ICD-10-CM | POA: Diagnosis not present

## 2024-01-16 DIAGNOSIS — J431 Panlobular emphysema: Secondary | ICD-10-CM | POA: Diagnosis not present

## 2024-01-16 DIAGNOSIS — Z79899 Other long term (current) drug therapy: Secondary | ICD-10-CM | POA: Diagnosis not present

## 2024-01-16 DIAGNOSIS — E782 Mixed hyperlipidemia: Secondary | ICD-10-CM | POA: Diagnosis not present

## 2024-01-16 DIAGNOSIS — R739 Hyperglycemia, unspecified: Secondary | ICD-10-CM | POA: Diagnosis not present

## 2024-01-16 DIAGNOSIS — M818 Other osteoporosis without current pathological fracture: Secondary | ICD-10-CM | POA: Diagnosis not present

## 2024-01-24 ENCOUNTER — Other Ambulatory Visit

## 2024-01-24 ENCOUNTER — Encounter

## 2024-01-30 ENCOUNTER — Inpatient Hospital Stay: Admission: RE | Admit: 2024-01-30 | Source: Ambulatory Visit

## 2024-01-30 ENCOUNTER — Encounter

## 2024-01-30 ENCOUNTER — Telehealth: Payer: Self-pay | Admitting: Oncology

## 2024-01-30 NOTE — Telephone Encounter (Signed)
 Pt was scheduled to see MD this Friday for mammo results. The mammo had been r/s per pt daughter. I have r.s MD appt as well. I left vm with new appt date/time and sent mychart msg to pt

## 2024-02-02 ENCOUNTER — Inpatient Hospital Stay: Payer: PPO | Admitting: Oncology

## 2024-02-07 DIAGNOSIS — M5416 Radiculopathy, lumbar region: Secondary | ICD-10-CM | POA: Diagnosis not present

## 2024-02-07 DIAGNOSIS — K209 Esophagitis, unspecified without bleeding: Secondary | ICD-10-CM | POA: Diagnosis not present

## 2024-02-07 DIAGNOSIS — M818 Other osteoporosis without current pathological fracture: Secondary | ICD-10-CM | POA: Diagnosis not present

## 2024-02-08 ENCOUNTER — Ambulatory Visit
Admission: RE | Admit: 2024-02-08 | Discharge: 2024-02-08 | Disposition: A | Discharge status: Home / Self Care | Source: Ambulatory Visit | Attending: General Surgery | Admitting: General Surgery

## 2024-02-08 ENCOUNTER — Ambulatory Visit
Admission: RE | Admit: 2024-02-08 | Discharge: 2024-02-08 | Disposition: A | Source: Ambulatory Visit | Attending: General Surgery

## 2024-02-08 DIAGNOSIS — C50911 Malignant neoplasm of unspecified site of right female breast: Secondary | ICD-10-CM | POA: Insufficient documentation

## 2024-02-08 DIAGNOSIS — Z853 Personal history of malignant neoplasm of breast: Secondary | ICD-10-CM | POA: Insufficient documentation

## 2024-02-13 DIAGNOSIS — C50911 Malignant neoplasm of unspecified site of right female breast: Secondary | ICD-10-CM | POA: Diagnosis not present

## 2024-02-19 ENCOUNTER — Encounter: Payer: Self-pay | Admitting: Oncology

## 2024-02-19 ENCOUNTER — Inpatient Hospital Stay: Attending: Oncology | Admitting: Oncology

## 2024-02-19 VITALS — BP 160/67 | HR 66 | Temp 97.8°F | Resp 18 | Wt 113.8 lb

## 2024-02-19 DIAGNOSIS — Z170 Estrogen receptor positive status [ER+]: Secondary | ICD-10-CM

## 2024-02-19 DIAGNOSIS — Z90710 Acquired absence of both cervix and uterus: Secondary | ICD-10-CM | POA: Insufficient documentation

## 2024-02-19 DIAGNOSIS — C50911 Malignant neoplasm of unspecified site of right female breast: Secondary | ICD-10-CM | POA: Diagnosis not present

## 2024-02-19 DIAGNOSIS — Z853 Personal history of malignant neoplasm of breast: Secondary | ICD-10-CM | POA: Insufficient documentation

## 2024-02-19 DIAGNOSIS — Z923 Personal history of irradiation: Secondary | ICD-10-CM | POA: Diagnosis not present

## 2024-02-19 DIAGNOSIS — Z803 Family history of malignant neoplasm of breast: Secondary | ICD-10-CM | POA: Diagnosis not present

## 2024-02-19 NOTE — Progress Notes (Signed)
 Hematology/Oncology Consult note [phone] [phone]         Patient Care Team: Cleotilde Oneil FALCON, MD as PCP - General (Internal Medicine) Babara Call, MD as Consulting Physician (Oncology)  CHIEF COMPLAINTS/REASON FOR VISIT:  Stage I right breast cancer   ASSESSMENT & PLAN:   Cancer Staging  Breast cancer [facility]) Staging form: Breast, AJCC 8th Edition - Clinical stage from 03/17/2022: Stage IA (cT1a, cN0, cM0, G2, ER+, PR-, HER2-) - Signed by Babara Call, MD on 03/23/2022 - Pathologic stage from 04/05/2022: Stage Unknown (pT1a, pNX, cM0, G2, ER+, PR-, HER2-) - Signed by Babara Call, MD on 04/20/2022   Breast cancer (HCC) pT1a pNx, no adjuvant chemotherapy needed.  Status post lumpectomy and adjuvant radiation. We discussed about the rationale and potential side effects of adjuvant endocrine therapy with aromatase inhibitor.  Recommend baseline DEXA-not done. Patient declined adjuvant endocrine therapy. Recommend annual mammogram for surveillance.-Diagnostic mammogram bilaterally October 2025-results reviewed.    No orders of the defined types were placed in this encounter.  Follow up 6 months. All questions were answered. The patient knows to call the clinic with any problems, questions or concerns.  Call Babara, MD, PhD [facility] Hematology Oncology 02/19/2024   HISTORY OF PRESENTING ILLNESS:   Hannah [name] is a  83 y.o.  female present for follow up of  right breast cancer.   Oncology History  Breast cancer (HCC)  01/24/2022 Imaging   Bilateral screening mammogram  In the right breast, a possible asymmetry warrants further evaluation. In the left breast, no findings suspicious for malignancy.    02/21/2022 Imaging   Unilateral breast mammogram and ultrasound  Showed irregular hypoechoic mass in the RIGHT breast at the 12 o'clock axis, 3 cm from the nipple, measuring 5 mm, a likely correlate for the mammographic finding. This is a suspicious  finding for which ultrasound-guided biopsy is recommended.    03/17/2022 Cancer Staging   Staging form: Breast, AJCC 8th Edition - Clinical stage from 03/17/2022: Stage IA (cT1a, cN0, cM0, G2, ER+, PR-, HER2-) - Signed by Babara Call, MD on 03/23/2022 Stage prefix: Initial diagnosis Histologic grading system: 3 grade system   03/17/2022 Initial Diagnosis   Breast cancer (HCC)  Right breast mass biopsy showed  Invasive mammary carcinoma with lobular features.  Grade 2, no DCIS, no LVI.  ER 90% positive, PR negative, HER2 negative [facility] 0]   04/05/2022 Surgery   Right lumpectomy showed  Invasive lobular carcinoma, grade 2, no DCIS, no LVI, negative margin pT1a pNx   04/05/2022 Cancer Staging   Staging form: Breast, AJCC 8th Edition - Pathologic stage from 04/05/2022: Stage Unknown (pT1a, pNX, cM0, G2, ER+, PR-, HER2-) - Signed by Babara Call, MD on 04/20/2022 Stage prefix: Initial diagnosis Multigene prognostic tests performed: None Histologic grading system: 3 grade system   05/17/2022 - 06/14/2022 Radiation Therapy   Adjuvant right breast radiation     Menarche 83 years of age, Patient has 2 children.  Age at first birth 86 She has previously used birth control pills, no details of duration. She menopause in late 87s. She has a history of hysterectomy. Denies any previous hormone replacement therapy. Denies previous breast biopsy.  Family history is positive for paternal aunt and paternal grandma with breast cancer.  No other family history of cancers. S/p lumpectomy and adjuvant right breast radiation.   INTERVAL HISTORY Hannah [name] is a 83 y.o. female who has above history reviewed by me today presents for follow up  visit for right breast cancer Patient was accompanied by female family member.  She has no new breast complaints.   MEDICAL HISTORY:  Past Medical History:  Diagnosis Date   anesthesia    shaking after an injection prior to cataract surgery   Asthma     Breast cancer (HCC) 03/23/2022   Cancer (HCC)    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   Chronic UTI    COVID    DVT (deep venous thrombosis) (HCC)    after knee surgery   Dysrhythmia    A-fib   GERD (gastroesophageal reflux disease)    Headache    Migraines   Heart murmur    Hyperlipidemia    Hypertension    Mitral valve prolapse    PONV (postoperative nausea and vomiting)     SURGICAL HISTORY: Past Surgical History:  Procedure Laterality Date   ABDOMINAL HYSTERECTOMY     BACK SURGERY  35 years   BOWEL RESECTION  09/09/2014   Procedure: , bladder instilation;  Surgeon: Elgin Laurence III, MD;  Location: [facility];  Service: General;;   BREAST BIOPSY Right 03/17/2022   Us  Core Bx, coil clip 12;00,lobular ca   BREAST BIOPSY Right 03/17/2022   US  RT BREAST BX W LOC DEV 1ST LESION IMG BX SPEC US  GUIDE 03/17/2022 [facility]   BREAST BIOPSY Right 03/31/2022   MM RT RADIO FREQUENCY TAG LOC MAMMO GUIDE 03/31/2022 [facility]   BREAST LUMPECTOMY Right 04/05/2022   lumpectomy invasive lobular breast ca, 4 mm closest margin post/deep, only radation   BREAST LUMPECTOMY WITH RADIOFREQUENCY TAG IDENTIFICATION Right 03/31/2022   hologic tag [id number]   CARDIAC CATHETERIZATION Right 09/09/2014   Procedure: CENTRAL LINE INSERTION;  Surgeon: Elgin Laurence III, MD;  Location: [facility];  Service: General;  Laterality: Right;   CATARACT EXTRACTION W/ INTRAOCULAR LENS IMPLANT Right    CATARACT EXTRACTION W/PHACO Left 05/11/2016   Procedure: CATARACT EXTRACTION PHACO AND INTRAOCULAR LENS PLACEMENT (IOC);  Surgeon: Steven Dingeldein, MD;  Location: [facility];  Service: Ophthalmology;  Laterality: Left;  US  1:25 AP% 25.4 CDE 36.38 Fluid pack lot # [id number]   JOINT REPLACEMENT Right    knee   LAPAROTOMY N/A 09/09/2014   Procedure: EXPLORATORY LAPAROTOMY, small bowel resection;  Surgeon: Elgin Laurence III, MD;  Location: [facility];  Service: General;  Laterality: N/A;   NASAL SINUS SURGERY       SOCIAL HISTORY: Social History   Socioeconomic History   Marital status: Married    Spouse name: Not on file   Number of children: Not on file   Years of education: Not on file   Highest education level: Not on file  Occupational History   Not on file  Tobacco Use   Smoking status: Never   Smokeless tobacco: Never  Vaping Use   Vaping status: Never Used  Substance and Sexual Activity   Alcohol use: No   Drug use: No   Sexual activity: Not Currently    Birth control/protection: Surgical    Comment: Hysterectomy  Other Topics Concern   Not on file  Social History Narrative   Not on file   Social Drivers of Health   Financial Resource Strain: Low Risk  (07/17/2023)   Received from [facility]   Overall Financial Resource Strain (CARDIA)    Difficulty of Paying Living Expenses: Not hard at all  Food Insecurity: No Food Insecurity (07/17/2023)   Received from [facility]   Hunger Vital Sign  Within the past 12 months, you worried that your food would run out before you got the money to buy more.: Never true    Within the past 12 months, the food you bought just didn't last and you didn't have money to get more.: Never true  Transportation Needs: No Transportation Needs (07/17/2023)   [facility] - Transportation    In the past 12 months, has lack of transportation kept you from medical appointments or from getting medications?: No    Lack of Transportation (Non-Medical): No  Physical Activity: Not on file  Stress: Not on file  Social Connections: Not on file  Intimate Partner Violence: Not on file    FAMILY HISTORY: Family History  Problem Relation Age of Onset   Heart disease Mother    Hypertension Mother    Breast cancer Paternal Aunt 91       2 aunts   Breast cancer Paternal Grandmother 37    ALLERGIES:  is allergic to codeine, dilaudid  [hydromorphone  hcl], meperidine, and vicodin  [hydrocodone-acetaminophen ].  MEDICATIONS:  Current Outpatient Medications  Medication Sig Dispense Refill   alendronate (FOSAMAX) 70 MG tablet Take by mouth.     aspirin 81 MG tablet Take 81 mg by mouth daily.     Aspirin-Acetaminophen -Caffeine (EXCEDRIN PO) Take 1 tablet by mouth as needed (headaches).     losartan (COZAAR) 25 MG tablet Take 25 mg by mouth daily.     metoprolol  succinate (TOPROL -XL) 50 MG 24 hr tablet Take 50 mg by mouth 2 (two) times daily. Take with or immediately following a meal.     amLODipine  (NORVASC ) 5 MG tablet Take 5 mg by mouth daily. (Patient not taking: Reported on 09/25/2023)     No current facility-administered medications for this visit.    Review of Systems  Constitutional:  Negative for appetite change, chills, fatigue and fever.  HENT:   Negative for hearing loss and voice change.   Eyes:  Negative for eye problems.  Respiratory:  Negative for chest tightness and cough.   Cardiovascular:  Negative for chest pain.  Gastrointestinal:  Negative for abdominal distention, abdominal pain and blood in stool.  Endocrine: Negative for hot flashes.  Genitourinary:  Negative for difficulty urinating and frequency.   Musculoskeletal:  Negative for arthralgias.  Skin:  Negative for itching and rash.  Neurological:  Negative for extremity weakness.  Hematological:  Negative for adenopathy.  Psychiatric/Behavioral:  Negative for confusion.    PHYSICAL EXAMINATION: ECOG PERFORMANCE STATUS: 1 - Symptomatic but completely ambulatory Vitals:   02/19/24 1349 02/19/24 1351  BP: (!) 146/59 (!) 160/67  Pulse:  66  Resp: 18   Temp: 97.8 F (36.6 C)    Filed Weights   02/19/24 1349  Weight: 113 lb 12.8 oz (51.6 kg)    Physical Exam Constitutional:      General: She is not in acute distress. HENT:     Head: Normocephalic and atraumatic.  Eyes:     General: No scleral icterus. Cardiovascular:     Rate and Rhythm: Normal rate and regular rhythm.   Pulmonary:     Effort: Pulmonary effort is normal. No respiratory distress.     Breath sounds: Normal breath sounds. No wheezing.  Abdominal:     General: Bowel sounds are normal. There is no distension.     Palpations: Abdomen is soft.  Musculoskeletal:        General: No deformity. Normal range of motion.  Cervical back: Normal range of motion and neck supple.  Skin:    General: Skin is warm and dry.     Findings: No erythema or rash.  Neurological:     Mental Status: She is alert and oriented to person, place, and time. Mental status is at baseline.  Psychiatric:        Mood and Affect: Mood normal.   LABORATORY DATA:  I have reviewed the data as listed    Latest Ref Rng & Units 03/03/2023   11:35 AM 06/09/2022    1:26 PM 09/18/2014    5:45 AM  CBC  WBC 4.0 - 10.5 K/uL 7.5  10.2  12.4   Hemoglobin 12.0 - 15.0 g/dL 87.2  87.1  89.2   Hematocrit 36.0 - 46.0 % 39.6  40.0  31.9   Platelets 150 - 400 K/uL 269  274  427       Latest Ref Rng & Units 03/03/2023   11:35 AM 09/18/2014    5:45 AM 09/14/2014    4:10 AM  CMP  Glucose 70 - 99 mg/dL 897  876  856   BUN 8 - 23 mg/dL 17  6  <5   Creatinine 0.44 - 1.00 mg/dL 9.38  9.47  9.58   Sodium 135 - 145 mmol/L 142  137  135   Potassium 3.5 - 5.1 mmol/L 3.8  3.9  3.7   Chloride 98 - 111 mmol/L 104  103  102   CO2 22 - 32 mmol/L 28  25  29    Calcium 8.9 - 10.3 mg/dL 9.2  8.4  7.5   Total Protein 6.5 - 8.1 g/dL 6.4  6.2    Total Bilirubin <1.2 mg/dL 0.5  0.9    Alkaline Phos 38 - 126 U/L 44  142    AST 15 - 41 U/L 16  21    ALT 0 - 44 U/L 14  24        RADIOGRAPHIC STUDIES: I have personally reviewed the radiological images as listed and agreed with the findings in the report. MM 3D DIAGNOSTIC MAMMOGRAM BILATERAL BREAST Result Date: 02/08/2024 CLINICAL DATA:  December of 2023 RIGHT breast lumpectomy and radiation. No RIGHT breast complaints today. Intermittent nonfocal LEFT breast pain. EXAM: DIGITAL DIAGNOSTIC BILATERAL  MAMMOGRAM WITH TOMOSYNTHESIS AND CAD TECHNIQUE: Bilateral digital diagnostic mammography and breast tomosynthesis was performed. The images were evaluated with computer-aided detection. COMPARISON:  Previous exam(s). ACR Breast Density Category b: There are scattered areas of fibroglandular density. FINDINGS: There is density and architectural distortion within the RIGHT breast, corresponding to the site of prior lumpectomy. Spot compression magnification view(s) demonstrate no evidence of recurrent malignancy. Findings are stable compared to prior. There is no mammographic evidence of malignancy in EITHER breast. IMPRESSION: No mammographic evidence of malignancy in EITHER breast. RECOMMENDATION: Diagnostic mammogram of the BILATERAL breasts (with BILATERAL ultrasound if deemed necessary) in 1 year. I have discussed the findings and recommendations with the patient. If applicable, a reminder letter will be sent to the patient regarding the next appointment. BI-RADS CATEGORY  2: Benign. Electronically Signed   By: Norleen Croak M.D.   On: 02/08/2024 14:55

## 2024-02-19 NOTE — Assessment & Plan Note (Addendum)
 pT1a pNx, no adjuvant chemotherapy needed.  Status post lumpectomy and adjuvant radiation. We discussed about the rationale and potential side effects of adjuvant endocrine therapy with aromatase inhibitor.  Recommend baseline DEXA-not done. Patient declined adjuvant endocrine therapy. Recommend annual mammogram for surveillance.-Diagnostic mammogram bilaterally October 2025-results reviewed.

## 2024-03-01 DIAGNOSIS — Z1331 Encounter for screening for depression: Secondary | ICD-10-CM | POA: Diagnosis not present

## 2024-03-01 DIAGNOSIS — M810 Age-related osteoporosis without current pathological fracture: Secondary | ICD-10-CM | POA: Diagnosis not present

## 2024-03-10 DIAGNOSIS — R350 Frequency of micturition: Secondary | ICD-10-CM | POA: Diagnosis not present

## 2024-03-10 DIAGNOSIS — R8290 Unspecified abnormal findings in urine: Secondary | ICD-10-CM | POA: Diagnosis not present

## 2024-03-10 DIAGNOSIS — N390 Urinary tract infection, site not specified: Secondary | ICD-10-CM | POA: Diagnosis not present

## 2024-08-19 ENCOUNTER — Inpatient Hospital Stay: Admitting: Oncology
